# Patient Record
Sex: Male | Born: 1980 | Race: White | Hispanic: No | Marital: Single | Smoking: Current every day smoker
Health system: Southern US, Community
[De-identification: ages and names within clinical notes are randomized; demographics above are authoritative.]

## PROBLEM LIST (undated history)

## (undated) DIAGNOSIS — I10 Essential (primary) hypertension: Secondary | ICD-10-CM

## (undated) DIAGNOSIS — T7840XA Allergy, unspecified, initial encounter: Secondary | ICD-10-CM

## (undated) DIAGNOSIS — K7460 Unspecified cirrhosis of liver: Secondary | ICD-10-CM

## (undated) HISTORY — PX: OTHER SURGICAL HISTORY: SHX169

## (undated) HISTORY — DX: Allergy, unspecified, initial encounter: T78.40XA

## (undated) HISTORY — PX: FRACTURE SURGERY: SHX138

---
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
## 2015-01-01 ENCOUNTER — Ambulatory Visit (INDEPENDENT_AMBULATORY_CARE_PROVIDER_SITE_OTHER): Payer: Medicaid Other

## 2015-01-01 ENCOUNTER — Ambulatory Visit (INDEPENDENT_AMBULATORY_CARE_PROVIDER_SITE_OTHER): Payer: Medicaid Other | Admitting: Family Medicine

## 2015-01-01 ENCOUNTER — Encounter: Payer: Self-pay | Admitting: Family Medicine

## 2015-01-01 VITALS — BP 147/84 | HR 92 | Temp 98.0°F | Ht 71.0 in | Wt 212.0 lb

## 2015-01-01 DIAGNOSIS — M25561 Pain in right knee: Secondary | ICD-10-CM | POA: Insufficient documentation

## 2015-01-01 MED ORDER — HYDROCODONE-ACETAMINOPHEN 5-325 MG PO TABS: 1.0000 | ORAL_TABLET | Freq: Four times a day (QID) | ORAL | Status: DC | PRN

## 2015-01-01 NOTE — Progress Notes (Signed)
   HPI  Patient presents today for evaluation of right knee pain  Patient explains that about a week ago he bumped into a nightstand with the lateral edge of his right knee and fell to the ground landing on his right lateral knee.   He had significant swelling for a few days that has improved. He still continues to have generalized anterior knee pain with standing or walking. He also complains of medial knee pain. He denies any locking or popping symptoms. He has a significant past surgical history of femur reconstruction with a rod which is seen on x-ray today. 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  He's been trying Advil with some improvement of the pain. He's also tried ice  PMH: Smoking status noted ROS: Per HPI  Objective: BP 147/84 mmHg  Pulse 92  Temp(Src) 98 F (36.7 C) (Oral)  Ht  (1.803 m)  Wt 212 lb (96.163 kg)  BMI 29.58 kg/m2 Gen: NAD, alert, cooperative with exam HEENT: NCAT CV: RRR, good S1/S2, no murmur Resp: CTABL, no wheezes, non-labored Ext: No edema, warm Neuro: Alert and oriented, No gross deficits MSK: R knee without erythema, effusion, bruising, or gross deformity - some scarring on Lateral knee No joint line tenderness.  ligamentously intact to Lachman's and with varus and valgus stress.  Negative McMurray's test Some pain with valgus stress  Knee x-ray 01/01/2015: No acute fracture, our group from previous surgery and right femur No significant OA  Assessment and plan:  #  knee pain knee pain after an injury one week ago. Given one additional week out of work. Considering that he has hardware in his femur in very close proximity on very hesitant to offer steroid injection or aspiration today. He does not have an effusion Recommended conservative treatment with NSAIDs,  compression, ice Also given Norco for breakthrough pain Call in 1-2 weeks if not improved we'll gladly try to help him arrange orthopedics follow-up this will be difficult given his self-pay situation Discussed limitations giving narcotic pain medications, limit to one prescription for this injury. He understands     Orders Placed This Encounter  Procedures  . DG Knee 1-2 Views Right    Standing Status: Future     Number of Occurrences: 1     Standing Expiration Date: 03/02/2016    Order Specific Question:  Reason for Exam (SYMPTOM  OR DIAGNOSIS REQUIRED)    Answer:  R knee pain, Hx of femur surgery    Order Specific Question:  Preferred imaging location?    Answer:  Internal    Meds ordered this encounter  Medications  . HYDROcodone-acetaminophen (NORCO) 5-325 MG per tablet    Sig: Take 1 tablet by mouth every 6 (six) hours as needed for moderate pain.    Dispense:  30 tablet    Refill:  0    Murtis Sink, MD [facility] 01/01/2015, 4:56 PM

## 2015-01-01 NOTE — Patient Instructions (Signed)
Great to meet you guys!   Try aleve 2 pills twice a day for the next week  hydrocodone for unrelieved pain Ice 15 minutes 4-6 times a day Compression (ace bandage or tight knee slexe with a hole over the kneecap)   Call me if you haven't improved we can help you get into ortho.

## 2015-04-03 ENCOUNTER — Telehealth: Payer: Self-pay | Admitting: Family Medicine

## 2015-04-03 NOTE — Telephone Encounter (Signed)
denied °

## 2017-02-03 ENCOUNTER — Telehealth: Payer: Self-pay | Admitting: *Deleted

## 2017-02-03 NOTE — Telephone Encounter (Addendum)
Opened in Error/SLS 

## 2019-12-31 ENCOUNTER — Other Ambulatory Visit: Payer: Self-pay

## 2019-12-31 ENCOUNTER — Emergency Department (HOSPITAL_COMMUNITY)
Admission: EM | Admit: 2019-12-31 | Discharge: 2020-01-01 | Disposition: A | Discharge status: Home / Self Care | Payer: BC Managed Care – PPO | Attending: Emergency Medicine | Admitting: Emergency Medicine

## 2019-12-31 ENCOUNTER — Encounter (HOSPITAL_COMMUNITY): Payer: Self-pay | Admitting: Emergency Medicine

## 2019-12-31 DIAGNOSIS — F1910 Other psychoactive substance abuse, uncomplicated: Secondary | ICD-10-CM | POA: Diagnosis not present

## 2019-12-31 DIAGNOSIS — R45851 Suicidal ideations: Secondary | ICD-10-CM | POA: Diagnosis not present

## 2019-12-31 DIAGNOSIS — Z20822 Contact with and (suspected) exposure to covid-19: Secondary | ICD-10-CM | POA: Diagnosis not present

## 2019-12-31 DIAGNOSIS — F17210 Nicotine dependence, cigarettes, uncomplicated: Secondary | ICD-10-CM | POA: Insufficient documentation

## 2019-12-31 DIAGNOSIS — F329 Major depressive disorder, single episode, unspecified: Secondary | ICD-10-CM | POA: Insufficient documentation

## 2019-12-31 DIAGNOSIS — Z79899 Other long term (current) drug therapy: Secondary | ICD-10-CM | POA: Diagnosis not present

## 2019-12-31 DIAGNOSIS — Y907 Blood alcohol level of 200-239 mg/100 ml: Secondary | ICD-10-CM | POA: Insufficient documentation

## 2019-12-31 NOTE — ED Triage Notes (Addendum)
Pt to ED with c/o needing help for ETOH   St's he has been feeling suicidal x's 2 years  Does not have a plan  Pt denies HI  Pt st's he just got out of a 28 day rehab program and started drinking again the day he got out

## 2020-01-01 ENCOUNTER — Emergency Department (HOSPITAL_COMMUNITY): Admission: EM | Admit: 2020-01-01 | Payer: BC Managed Care – PPO | Source: Home / Self Care

## 2020-01-01 LAB — COMPREHENSIVE METABOLIC PANEL
ALT: 118 U/L — ABNORMAL HIGH (ref 0–44)
AST: 128 U/L — ABNORMAL HIGH (ref 15–41)
Albumin: 4.1 g/dL (ref 3.5–5.0)
Alkaline Phosphatase: 92 U/L (ref 38–126)
Anion gap: 21 — ABNORMAL HIGH (ref 5–15)
BUN: 9 mg/dL (ref 6–20)
CO2: 20 mmol/L — ABNORMAL LOW (ref 22–32)
Calcium: 8.6 mg/dL — ABNORMAL LOW (ref 8.9–10.3)
Chloride: 99 mmol/L (ref 98–111)
Creatinine, Ser: 0.77 mg/dL (ref 0.61–1.24)
GFR calc Af Amer: >60 mL/min (ref >60)
GFR calc non Af Amer: >60 mL/min (ref >60)
Glucose, Bld: 84 mg/dL (ref 70–99)
Potassium: 3.6 mmol/L (ref 3.5–5.1)
Sodium: 140 mmol/L (ref 135–145)
Total Bilirubin: 0.8 mg/dL (ref 0.3–1.2)
Total Protein: 8.8 g/dL — ABNORMAL HIGH (ref 6.5–8.1)

## 2020-01-01 LAB — RAPID URINE DRUG SCREEN, HOSP PERFORMED
Amphetamines: NOT DETECTED
Barbiturates: NOT DETECTED
Benzodiazepines: NOT DETECTED
Cocaine: NOT DETECTED
Opiates: NOT DETECTED
Tetrahydrocannabinol: NOT DETECTED

## 2020-01-01 LAB — BASIC METABOLIC PANEL
Anion gap: 16 — ABNORMAL HIGH (ref 5–15)
BUN: 9 mg/dL (ref 6–20)
CO2: 21 mmol/L — ABNORMAL LOW (ref 22–32)
Calcium: 7.8 mg/dL — ABNORMAL LOW (ref 8.9–10.3)
Chloride: 99 mmol/L (ref 98–111)
Creatinine, Ser: 0.76 mg/dL (ref 0.61–1.24)
GFR calc Af Amer: >60 mL/min (ref >60)
GFR calc non Af Amer: >60 mL/min (ref >60)
Glucose, Bld: 79 mg/dL (ref 70–99)
Potassium: 3.9 mmol/L (ref 3.5–5.1)
Sodium: 136 mmol/L (ref 135–145)

## 2020-01-01 LAB — CBC
HCT: 50.3 % (ref 39.0–52.0)
Hemoglobin: 16.9 g/dL (ref 13.0–17.0)
MCH: 32.9 pg (ref 26.0–34.0)
MCHC: 33.6 g/dL (ref 30.0–36.0)
MCV: 98.1 fL (ref 80.0–100.0)
Platelets: 263 10*3/uL (ref 150–400)
RBC: 5.13 MIL/uL (ref 4.22–5.81)
RDW: 11.4 % — ABNORMAL LOW (ref 11.5–15.5)
WBC: 9.1 10*3/uL (ref 4.0–10.5)
nRBC: 0 % (ref 0.0–0.2)

## 2020-01-01 LAB — SARS CORONAVIRUS 2 BY RT PCR (HOSPITAL ORDER, PERFORMED IN ~~LOC~~ HOSPITAL LAB): SARS Coronavirus 2: NEGATIVE

## 2020-01-01 LAB — ETHANOL: Alcohol, Ethyl (B): 356 mg/dL (ref <10)

## 2020-01-01 LAB — SALICYLATE LEVEL: Salicylate Lvl: <7 mg/dL — ABNORMAL LOW (ref 7.0–30.0)

## 2020-01-01 LAB — ACETAMINOPHEN LEVEL: Acetaminophen (Tylenol), Serum: <10 ug/mL — ABNORMAL LOW (ref 10–30)

## 2020-01-01 MED ORDER — ALUM & MAG HYDROXIDE-SIMETH 200-200-20 MG/5ML PO SUSP
30.0000 mL | Freq: Once | ORAL | Status: AC
  Administered 2020-01-01: 30 mL via ORAL
  Filled 2020-01-01: qty 30

## 2020-01-01 MED ORDER — LORAZEPAM 1 MG PO TABS: 0.0000 mg | ORAL_TABLET | Freq: Two times a day (BID) | ORAL | Status: DC

## 2020-01-01 MED ORDER — LORAZEPAM 2 MG/ML IJ SOLN: 0.0000 mg | Freq: Four times a day (QID) | INTRAMUSCULAR | Status: DC

## 2020-01-01 MED ORDER — SODIUM CHLORIDE 0.9 % IV BOLUS
1000.0000 mL | Freq: Once | INTRAVENOUS | Status: AC
  Administered 2020-01-01: 1000 mL via INTRAVENOUS

## 2020-01-01 MED ORDER — LORAZEPAM 2 MG/ML IJ SOLN: 0.0000 mg | Freq: Two times a day (BID) | INTRAMUSCULAR | Status: DC

## 2020-01-01 MED ORDER — THIAMINE HCL 100 MG PO TABS
100.0000 mg | ORAL_TABLET | Freq: Every day | ORAL | Status: DC
  Administered 2020-01-01: 100 mg via ORAL
  Filled 2020-01-01: qty 1

## 2020-01-01 MED ORDER — LORAZEPAM 1 MG PO TABS
0.0000 mg | ORAL_TABLET | Freq: Four times a day (QID) | ORAL | Status: DC
  Administered 2020-01-01: 1 mg via ORAL
  Filled 2020-01-01: qty 1

## 2020-01-01 MED ORDER — THIAMINE HCL 100 MG/ML IJ SOLN: 100.0000 mg | Freq: Every day | INTRAMUSCULAR | Status: DC

## 2020-01-01 NOTE — ED Notes (Signed)
Request dinner tray for pt

## 2020-01-01 NOTE — ED Notes (Signed)
Pt reports leaving rehab Last Monday, started drinking that day.  Reports he has not eaten in 5 days.

## 2020-01-01 NOTE — ED Notes (Signed)
Pt st's he does not want to stay.

## 2020-01-01 NOTE — ED Notes (Signed)
RN faxed paperwork to [employer]. Pt belongings given back to pt.

## 2020-01-01 NOTE — BH Assessment (Signed)
1500 hours this writer made contact with patient's mother [name] [phone] who did not have any safety concerns in reference to patient being discharged this date. Case was staffed with Maisie Fus NP who recommended once collateral was obtained and no safety concerns were noted patient could be discharged. RN at [facility] was informed with updated disposition.

## 2020-01-01 NOTE — ED Notes (Signed)
Patient verbalizes understanding of discharge instructions. Opportunity for questioning and answers were provided. Armband removed by staff, pt discharged from ED. Pt. ambulatory and discharged home.  

## 2020-01-01 NOTE — ED Provider Notes (Signed)
Psychiatry has reevaluated the patient this afternoon, they also spoke with the patient's mother who did not have collateral or continued safety concerns.  At this time the patient is psychiatrically clear they recommend resending IVC and discharge home.  IVC rescinded by Dr. Jeraldine Loots.   Dartha Lodge, PA-C 01/01/20 1606    Gerhard Munch, MD 01/02/20 [phone]

## 2020-01-01 NOTE — ED Notes (Signed)
Pt initially stated he was not suicidal however he later stated he was drinking to end his life.  Pt is calm but guarded.  Gave water to help him find water to urinate.

## 2020-01-01 NOTE — BH Assessment (Addendum)
Comprehensive Clinical Assessment (CCA) Screening, Triage and Referral Note  01/01/2020 Craig Andrews [id number] Patient presents this date with IVC. Per IVC patient was just discharged from 28 days at [facility] on 12/31/19. IVC states patient became impaired yesterday on alcohol and threatened to kill his daughter stating "she was a mistake." Daughter is currently residing with patient's mother. Patient reported said he was going "to a gun store to get a gun" although all the gun shops were closed. A shotgun was removed by the petitioned and is in their possession at this time. Patient was reported to be experiencing AVH at that time per petitioner. Patient relapsed not soon after he was discharged stating he consumed over one fifth of liquor this date. Patient denies any other ingestion. Patient denies any S/I, H/I or AVH. Patient states he is employed at [employer] in [place] where he resides with his 32 year old daughter. Patient denies any prior mental health history or prior attempts or gestures at self harm. Patient denies any current mental health symptoms or prescribed any psychotropic medications. Patient states he was consuming various amounts of alcohol two to three times weekly for the last year until he entered into a 28 program noted above. Patient denies content of IVC although states "he really doesn't remember much" that occurred prior to his arrival at ED. Patient had a noted BAL of 356 on arrival. Patient's UDS was negative. Patient could not identify any immediate stressor associated with his relapse yesterday stating "I am just a alcoholic." Patient does state he remembers having a verbal altercation with his daughter over her "not liking I was drinking again" although cannot recall any statements that he intended to harm her. Patient reports the only weapon he was in possession of was confiscated by petitioner as mentioned above. Patient reports current withdrawals to  include: tremors and some nausea. Patient is remorseful in reference to the events that transpired prior to arrival. Patient states "I really messed up" and need to "get it together." Patient denies any self harm this date, H/I or AVH.   Patient's notes on arrival by EDP: Patient presents to the emergency department with a chief complaint of substance abuse.  He is under IVC for threatening to kill his daughter.  He states that he drinks heavily.  He denies SI.  Denies any recent drug use. Denies any recent illnesses. Collateral information from mother could not be obtained at this time.   Patient is alert and oriented. Patient's memory is intact and thoughts organized. Patient does not appear to be responding to internal stimuli.   1500 hours this writer made contact with patient's mother Craig Andrews [phone] who did not have any safety concerns in reference to patient being discharged this date. RN at [facility] was informed and EDP will rescind IVC and patient discharged.       Visit Diagnosis: Alcohol induced mood disorder.    ICD-10-CM   1. Suicidal ideation  R45.851     Patient Reported Information How did you hear about [place]? Other (Comment) (IVC)   Referral name: No data recorded  Referral phone number: No data recorded Whom do you see for routine medical problems? I don't have a doctor   Practice/Facility Name: No data recorded  Practice/Facility Phone Number: No data recorded  Name of Contact: No data recorded  Contact Number: No data recorded  Contact Fax Number: No data recorded  Prescriber Name: No data recorded  Prescriber Address (if known): No  data recorded What Is the Reason for Your Visit/Call Today? Pt is with IVC  How Long Has This Been Causing You Problems? <Week  Have You Recently Been in Any Inpatient Treatment (Hospital/Detox/Crisis Center/28-Day Program)? No   Name/Location of Program/Hospital:No data recorded  How Long Were You There? No data recorded  When  Were You Discharged? No data recorded Have You Ever Received Services From [facility] Before? No   Who Do You See at [facility]? No data recorded Have You Recently Had Any Thoughts About Hurting Yourself? No   Are You Planning to Commit Suicide/Harm Yourself At This time?  No  Have you Recently Had Thoughts About Hurting Someone Karolee Ohs? No   Explanation: Pt used alcohol prior to arrival  Have You Used Any Alcohol or Drugs in the Past 24 Hours? Yes   How Long Ago Did You Use Drugs or Alcohol?  0100   What Did You Use and How Much? Pt states over one fifth of alcohol  What Do You Feel Would Help You the Most Today? Therapy  Do You Currently Have a Therapist/Psychiatrist? No   Name of Therapist/Psychiatrist: No data recorded  Have You Been Recently Discharged From Any Office Practice or Programs? No   Explanation of Discharge From Practice/Program:  No data recorded    CCA Screening Triage Referral Assessment Type of Contact: Face-to-Face   Is this Initial or Reassessment? No data recorded  Date Telepsych consult ordered in [facility]:  No data recorded  Time Telepsych consult ordered in [facility]:  No data recorded Patient Reported Information Reviewed? Yes   Patient Left Without Being Seen? No data recorded  Reason for Not Completing Assessment: No data recorded Collateral Involvement: None at this time  Does Patient Have a Court Appointed Legal Guardian? No data recorded  Name and Contact of Legal Guardian:  No data recorded If Minor and Not Living with Parent(s), Who has Custody? No data recorded Is CPS involved or ever been involved? Never  Is APS involved or ever been involved? Never  Patient Determined To Be At Risk for Harm To Self or Others Based on Review of Patient Reported Information or Presenting Complaint? No   Method: No data recorded  Availability of Means: No data recorded  Intent: No data recorded  Notification Required: No data recorded  Additional  Information for Danger to Others Potential:  No data recorded  Additional Comments for Danger to Others Potential:  No data recorded  Are There Guns or Other Weapons in Your Home?  No data recorded   Types of Guns/Weapons: No data recorded   Are These Weapons Safely Secured?                              No data recorded   Who Could Verify You Are Able To Have These Secured:    No data recorded Do You Have any Outstanding Charges, Pending Court Dates, Parole/Probation? No data recorded Contacted To Inform of Risk of Harm To Self or Others: Other: Comment (NA)  Location of Assessment: [facility]  Does Patient Present under Involuntary Commitment? Yes   IVC Papers Initial File Date: 01/01/20   [place] of Residence: [place]  Patient Currently Receiving the Following Services: Not Receiving Services   Determination of Need: No data recorded  Options For Referral: Outpatient Therapy   Alfredia Ferguson, LCAS

## 2020-01-01 NOTE — ED Provider Notes (Signed)
[facility] EMERGENCY DEPARTMENT Provider Note   CSN: [id number] Arrival date & time: 12/31/19  2336     History Chief Complaint  Patient presents with  . Suicidal    Lyn Deemer is a 39 y.o. male.  Patient presents to the emergency department with a chief complaint of substance abuse.  He is under IVC for threatening to kill his daughter.  He states that he drinks heavily.  He denies SI.  Denies any recent drug use.  Denies any recent illnesses.  The history is provided by the patient. No language interpreter was used.       Past Medical History:  Diagnosis Date  . Allergy     Patient Active Problem List   Diagnosis Date Noted  . Right knee pain 01/01/2015    Past Surgical History:  Procedure Laterality Date  . FRACTURE SURGERY    . rod inside leg         No family history on file.  Social History   Tobacco Use  . Smoking status: Current Every Day Smoker    Packs/day: 0.50    Types: Cigarettes  . Smokeless tobacco: Never Used  Substance Use Topics  . Alcohol use: Yes  . Drug use: Yes    Types: Marijuana    Home Medications Prior to Admission medications   Medication Sig Start Date End Date Taking? Authorizing Provider  HYDROcodone-acetaminophen (NORCO) 5-325 MG per tablet Take 1 tablet by mouth every 6 (six) hours as needed for moderate pain. 01/01/15   Elenora Gamma, MD    Allergies    Patient has no allergy information on record.  Review of Systems   Review of Systems  All other systems reviewed and are negative.   Physical Exam Updated Vital Signs BP 138/89 (BP Location: Right Arm)   Pulse (!) 110   Temp 98.8 F (37.1 C) (Oral)   Resp 16   Ht 5\' 11"  (1.803 m)   Wt 86.2 kg   SpO2 95%   BMI 26.50 kg/m   Physical Exam Vitals and nursing note reviewed.  Constitutional:      Appearance: He is well-developed.  HENT:     Head: Normocephalic and atraumatic.  Eyes:     Conjunctiva/sclera: Conjunctivae  normal.  Cardiovascular:     Rate and Rhythm: Normal rate and regular rhythm.     Heart sounds: No murmur heard.   Pulmonary:     Effort: Pulmonary effort is normal. No respiratory distress.     Breath sounds: Normal breath sounds.  Abdominal:     Palpations: Abdomen is soft.     Tenderness: There is no abdominal tenderness.  Musculoskeletal:     Cervical back: Neck supple.  Skin:    General: Skin is warm and dry.  Neurological:     Mental Status: He is alert.  Psychiatric:     Comments: Depressed     ED Results / Procedures / Treatments   Labs (all labs ordered are listed, but only abnormal results are displayed) Labs Reviewed  COMPREHENSIVE METABOLIC PANEL - Abnormal; Notable for the following components:      Result Value   CO2 20 (*)    Calcium 8.6 (*)    Total Protein 8.8 (*)    AST 128 (*)    ALT 118 (*)    Anion gap 21 (*)    All other components within normal limits  ETHANOL - Abnormal; Notable for the following components:   Alcohol,  Ethyl (B) 356 (*)    All other components within normal limits  SALICYLATE LEVEL - Abnormal; Notable for the following components:   Salicylate Lvl <7.0 (*)    All other components within normal limits  ACETAMINOPHEN LEVEL - Abnormal; Notable for the following components:   Acetaminophen (Tylenol), Serum <10 (*)    All other components within normal limits  CBC - Abnormal; Notable for the following components:   RDW 11.4 (*)    All other components within normal limits  RAPID URINE DRUG SCREEN, HOSP PERFORMED    EKG None  Radiology No results found.  Procedures Procedures (including critical care time)  Medications Ordered in ED Medications - No data to display  ED Course  I have reviewed the triage vital signs and the nursing notes.  Pertinent labs & imaging results that were available during my care of the patient were reviewed by me and considered in my medical decision making (see chart for details).    MDM  Rules/Calculators/A&P                          Patient here under IVC.  IVC papers state that he threatened to kill his daughter because she is "a mistake."  Apparently he attempted to go to a gun store to buy a gun.  Laboratory work-up notable for elevated anion gap of 21, will give some fluid.  Suspect this is secondary to his alcohol use.  Ethanol is 356.  6:32 AM Anion gap improving after fluids.  Patient appears medically clear for TTS evaluation.    Final Clinical Impression(s) / ED Diagnoses Final diagnoses:  Suicidal ideation    Rx / DC Orders ED Discharge Orders    None       Roxy Horseman, PA-C 01/01/20 [id number]    Palumbo, April, MD 01/01/20 [phone]

## 2020-01-01 NOTE — ED Notes (Signed)
Theodoro Grist at bedside completing mental health assessment

## 2020-01-01 NOTE — ED Notes (Signed)
Gave pt sandwich and water per his request

## 2020-01-01 NOTE — ED Notes (Signed)
Belongings inventoried and 2 bags placed in locker 1.  Breakfast ordered

## 2020-01-01 NOTE — ED Notes (Signed)
This NT asked pt to change into purple scrubs, pt states "you can put me right back in the car that brought me here". RN notified.

## 2020-01-01 NOTE — ED Notes (Signed)
PT took shower- linens changed

## 2020-02-20 ENCOUNTER — Encounter (HOSPITAL_COMMUNITY): Payer: Self-pay | Admitting: *Deleted

## 2020-02-20 ENCOUNTER — Emergency Department (HOSPITAL_COMMUNITY): Payer: BC Managed Care – PPO

## 2020-02-20 ENCOUNTER — Other Ambulatory Visit: Payer: Self-pay

## 2020-02-20 DIAGNOSIS — R079 Chest pain, unspecified: Secondary | ICD-10-CM | POA: Diagnosis present

## 2020-02-20 DIAGNOSIS — R5383 Other fatigue: Secondary | ICD-10-CM | POA: Diagnosis not present

## 2020-02-20 DIAGNOSIS — Z20822 Contact with and (suspected) exposure to covid-19: Secondary | ICD-10-CM | POA: Diagnosis not present

## 2020-02-20 DIAGNOSIS — R509 Fever, unspecified: Secondary | ICD-10-CM | POA: Insufficient documentation

## 2020-02-20 DIAGNOSIS — Z5321 Procedure and treatment not carried out due to patient leaving prior to being seen by health care provider: Secondary | ICD-10-CM | POA: Diagnosis not present

## 2020-02-20 DIAGNOSIS — R0602 Shortness of breath: Secondary | ICD-10-CM | POA: Insufficient documentation

## 2020-02-20 LAB — RESPIRATORY PANEL BY RT PCR (FLU A&B, COVID)
Influenza A by PCR: NEGATIVE
Influenza B by PCR: NEGATIVE
SARS Coronavirus 2 by RT PCR: NEGATIVE

## 2020-02-20 NOTE — ED Triage Notes (Signed)
Pt with mid CP with SOB starting today, fevers at home.  Fatigued.  Denies any known Covid exposure.  Denies taking covid vaccine.

## 2020-02-21 ENCOUNTER — Emergency Department (HOSPITAL_COMMUNITY)
Admission: EM | Admit: 2020-02-21 | Discharge: 2020-02-21 | Disposition: A | Discharge status: Home / Self Care | Payer: BC Managed Care – PPO | Attending: Emergency Medicine | Admitting: Emergency Medicine

## 2020-02-21 LAB — CBC WITH DIFFERENTIAL/PLATELET
Abs Immature Granulocytes: 0.01 10*3/uL (ref 0.00–0.07)
Basophils Absolute: 0.2 10*3/uL — ABNORMAL HIGH (ref 0.0–0.1)
Basophils Relative: 2 %
Eosinophils Absolute: 0.1 10*3/uL (ref 0.0–0.5)
Eosinophils Relative: 1 %
HCT: 46.7 % (ref 39.0–52.0)
Hemoglobin: 15.2 g/dL (ref 13.0–17.0)
Immature Granulocytes: 0 %
Lymphocytes Relative: 34 %
Lymphs Abs: 2.4 10*3/uL (ref 0.7–4.0)
MCH: 31.5 pg (ref 26.0–34.0)
MCHC: 32.5 g/dL (ref 30.0–36.0)
MCV: 96.7 fL (ref 80.0–100.0)
Monocytes Absolute: 0.5 10*3/uL (ref 0.1–1.0)
Monocytes Relative: 7 %
Neutro Abs: 3.9 10*3/uL (ref 1.7–7.7)
Neutrophils Relative %: 56 %
Platelets: 241 10*3/uL (ref 150–400)
RBC: 4.83 MIL/uL (ref 4.22–5.81)
RDW: 15.5 % (ref 11.5–15.5)
WBC: 6.9 10*3/uL (ref 4.0–10.5)
nRBC: 0 % (ref 0.0–0.2)

## 2020-02-21 LAB — BASIC METABOLIC PANEL
Anion gap: 11 (ref 5–15)
BUN: 18 mg/dL (ref 6–20)
CO2: 24 mmol/L (ref 22–32)
Calcium: 8 mg/dL — ABNORMAL LOW (ref 8.9–10.3)
Chloride: 102 mmol/L (ref 98–111)
Creatinine, Ser: 1.12 mg/dL (ref 0.61–1.24)
GFR, Estimated: >60 mL/min (ref >60)
Glucose, Bld: 103 mg/dL — ABNORMAL HIGH (ref 70–99)
Potassium: 3.7 mmol/L (ref 3.5–5.1)
Sodium: 137 mmol/L (ref 135–145)

## 2020-02-21 LAB — TROPONIN I (HIGH SENSITIVITY)
Troponin I (High Sensitivity): 4 ng/L (ref <18)
Troponin I (High Sensitivity): 4 ng/L (ref <18)

## 2020-02-21 NOTE — Progress Notes (Signed)
No answer from waiting room.

## 2020-02-21 NOTE — ED Notes (Signed)
Called for patient x 2.  Guest relations staff state they have not seen patient for quite some time now.

## 2020-02-22 ENCOUNTER — Other Ambulatory Visit: Payer: Self-pay

## 2020-02-22 ENCOUNTER — Emergency Department (HOSPITAL_COMMUNITY): Payer: BC Managed Care – PPO

## 2020-02-22 ENCOUNTER — Emergency Department (HOSPITAL_COMMUNITY)
Admission: EM | Admit: 2020-02-22 | Discharge: 2020-02-22 | Disposition: A | Discharge status: Home / Self Care | Payer: BC Managed Care – PPO | Attending: Emergency Medicine | Admitting: Emergency Medicine

## 2020-02-22 ENCOUNTER — Encounter (HOSPITAL_COMMUNITY): Payer: Self-pay

## 2020-02-22 DIAGNOSIS — Z79899 Other long term (current) drug therapy: Secondary | ICD-10-CM | POA: Insufficient documentation

## 2020-02-22 DIAGNOSIS — I10 Essential (primary) hypertension: Secondary | ICD-10-CM | POA: Diagnosis not present

## 2020-02-22 DIAGNOSIS — F1590 Other stimulant use, unspecified, uncomplicated: Secondary | ICD-10-CM | POA: Insufficient documentation

## 2020-02-22 DIAGNOSIS — F10920 Alcohol use, unspecified with intoxication, uncomplicated: Secondary | ICD-10-CM

## 2020-02-22 DIAGNOSIS — K7030 Alcoholic cirrhosis of liver without ascites: Secondary | ICD-10-CM | POA: Diagnosis not present

## 2020-02-22 DIAGNOSIS — R42 Dizziness and giddiness: Secondary | ICD-10-CM | POA: Insufficient documentation

## 2020-02-22 DIAGNOSIS — F17210 Nicotine dependence, cigarettes, uncomplicated: Secondary | ICD-10-CM | POA: Diagnosis not present

## 2020-02-22 DIAGNOSIS — R531 Weakness: Secondary | ICD-10-CM | POA: Diagnosis present

## 2020-02-22 DIAGNOSIS — F10220 Alcohol dependence with intoxication, uncomplicated: Secondary | ICD-10-CM | POA: Insufficient documentation

## 2020-02-22 HISTORY — DX: Essential (primary) hypertension: I10

## 2020-02-22 LAB — COMPREHENSIVE METABOLIC PANEL
ALT: 172 U/L — ABNORMAL HIGH (ref 0–44)
AST: 526 U/L — ABNORMAL HIGH (ref 15–41)
Albumin: 3.5 g/dL (ref 3.5–5.0)
Alkaline Phosphatase: 141 U/L — ABNORMAL HIGH (ref 38–126)
Anion gap: 15 (ref 5–15)
BUN: 15 mg/dL (ref 6–20)
CO2: 23 mmol/L (ref 22–32)
Calcium: 7.8 mg/dL — ABNORMAL LOW (ref 8.9–10.3)
Chloride: 100 mmol/L (ref 98–111)
Creatinine, Ser: 0.85 mg/dL (ref 0.61–1.24)
GFR, Estimated: >60 mL/min (ref >60)
Glucose, Bld: 94 mg/dL (ref 70–99)
Potassium: 4.1 mmol/L (ref 3.5–5.1)
Sodium: 138 mmol/L (ref 135–145)
Total Bilirubin: 1.5 mg/dL — ABNORMAL HIGH (ref 0.3–1.2)
Total Protein: 7.9 g/dL (ref 6.5–8.1)

## 2020-02-22 LAB — CBC WITH DIFFERENTIAL/PLATELET
Abs Immature Granulocytes: 0.03 10*3/uL (ref 0.00–0.07)
Basophils Absolute: 0.2 10*3/uL — ABNORMAL HIGH (ref 0.0–0.1)
Basophils Relative: 2 %
Eosinophils Absolute: 0.2 10*3/uL (ref 0.0–0.5)
Eosinophils Relative: 2 %
HCT: 43.1 % (ref 39.0–52.0)
Hemoglobin: 14.6 g/dL (ref 13.0–17.0)
Immature Granulocytes: 0 %
Lymphocytes Relative: 23 %
Lymphs Abs: 2.3 10*3/uL (ref 0.7–4.0)
MCH: 31.8 pg (ref 26.0–34.0)
MCHC: 33.9 g/dL (ref 30.0–36.0)
MCV: 93.9 fL (ref 80.0–100.0)
Monocytes Absolute: 0.9 10*3/uL (ref 0.1–1.0)
Monocytes Relative: 9 %
Neutro Abs: 6.7 10*3/uL (ref 1.7–7.7)
Neutrophils Relative %: 64 %
Platelets: 175 10*3/uL (ref 150–400)
RBC: 4.59 MIL/uL (ref 4.22–5.81)
RDW: 15.4 % (ref 11.5–15.5)
WBC: 10.3 10*3/uL (ref 4.0–10.5)
nRBC: 0 % (ref 0.0–0.2)

## 2020-02-22 LAB — URINALYSIS, ROUTINE W REFLEX MICROSCOPIC
Bilirubin Urine: NEGATIVE
Glucose, UA: NEGATIVE mg/dL
Hgb urine dipstick: NEGATIVE
Ketones, ur: NEGATIVE mg/dL
Leukocytes,Ua: NEGATIVE
Nitrite: NEGATIVE
Protein, ur: 30 mg/dL — AB
Specific Gravity, Urine: 1.021 (ref 1.005–1.030)
pH: 5 (ref 5.0–8.0)

## 2020-02-22 LAB — AMMONIA: Ammonia: 25 umol/L (ref 9–35)

## 2020-02-22 LAB — LIPASE, BLOOD: Lipase: 49 U/L (ref 11–51)

## 2020-02-22 LAB — ETHANOL: Alcohol, Ethyl (B): 342 mg/dL (ref <10)

## 2020-02-22 MED ORDER — SODIUM CHLORIDE 0.9 % IV BOLUS
1000.0000 mL | Freq: Once | INTRAVENOUS | Status: AC
  Administered 2020-02-22: 1000 mL via INTRAVENOUS

## 2020-02-22 NOTE — ED Provider Notes (Signed)
[facility] EMERGENCY DEPARTMENT Provider Note   CSN: [id number] Arrival date & time: 02/22/20  1029     History Chief Complaint  Patient presents with  . Weakness    Craig Andrews is a 39 y.o. male.  The history is provided by the patient and a relative. No language interpreter was used.  Weakness Severity:  Moderate Onset quality:  Gradual Timing:  Constant Progression:  Worsening Chronicity:  New Context: alcohol use   Relieved by:  Nothing Worsened by:  Nothing Ineffective treatments:  None tried Associated symptoms: difficulty walking, dizziness and nausea   Associated symptoms: no abdominal pain and no numbness in extremities   Pt reports he feels like he is drunk but he has not been drinking.  Pt reports he drinks frequently but has been cutting back.  Pt reports he last drank at 11pm last night.       Past Medical History:  Diagnosis Date  . Allergy   . Hypertension     Patient Active Problem List   Diagnosis Date Noted  . Right knee pain 01/01/2015    Past Surgical History:  Procedure Laterality Date  . FRACTURE SURGERY    . rod inside leg         No family history on file.  Social History   Tobacco Use  . Smoking status: Current Every Day Smoker    Packs/day: 0.50    Types: Cigarettes  . Smokeless tobacco: Never Used  Substance Use Topics  . Alcohol use: Yes    Comment: half gallon of liquir in 5 days  . Drug use: Yes    Frequency: 2.0 times per week    Types: Marijuana    Home Medications Prior to Admission medications   Medication Sig Start Date End Date Taking? Authorizing Provider  amLODipine (NORVASC) 5 MG tablet Take 5 mg by mouth daily.   Yes [provider]  fluticasone (FLONASE) 50 MCG/ACT nasal spray Place 1-2 sprays into both nostrils daily. 02/19/20 02/18/21 Yes [provider]  loratadine (CLARITIN) 10 MG tablet Take 10 mg by mouth daily as needed for allergies.   Yes [provider]    Multiple Vitamin (MULTIVITAMIN WITH MINERALS) TABS tablet Take 1 tablet by mouth daily.   Yes [provider]  thiamine 100 MG tablet Take 100 mg by mouth daily.   Yes [provider]  traZODone (DESYREL) 50 MG tablet Take 50 mg by mouth at bedtime as needed for sleep.   Yes [provider]    Allergies    Patient has no known allergies.  Review of Systems   Review of Systems  Gastrointestinal: Positive for nausea. Negative for abdominal pain.  Neurological: Positive for dizziness and weakness.  All other systems reviewed and are negative.   Physical Exam Updated Vital Signs BP (!) 147/92 (BP Location: Right Arm)   Pulse 88   Temp 98.5 F (36.9 C) (Oral)   Resp 14   Ht 5\' 11"  (1.803 m)   Wt 87.5 kg   SpO2 94%   BMI 26.92 kg/m   Physical Exam Vitals and nursing note reviewed.  Constitutional:      Appearance: He is well-developed.  HENT:     Head: Normocephalic and atraumatic.  Eyes:     Conjunctiva/sclera: Conjunctivae normal.  Cardiovascular:     Rate and Rhythm: Normal rate and regular rhythm.     Heart sounds: No murmur heard.   Pulmonary:     Effort: Pulmonary effort  is normal. No respiratory distress.     Breath sounds: Normal breath sounds.  Abdominal:     Palpations: Abdomen is soft.     Tenderness: There is no abdominal tenderness.  Musculoskeletal:        General: Normal range of motion.     Cervical back: Neck supple.  Skin:    General: Skin is warm and dry.  Neurological:     General: No focal deficit present.     Mental Status: He is alert.  Psychiatric:        Mood and Affect: Mood normal.     ED Results / Procedures / Treatments   Labs (all labs ordered are listed, but only abnormal results are displayed) Labs Reviewed  CBC WITH DIFFERENTIAL/PLATELET - Abnormal; Notable for the following components:      Result Value   Basophils Absolute 0.2 (*)    All other components within normal limits  COMPREHENSIVE  METABOLIC PANEL - Abnormal; Notable for the following components:   Calcium 7.8 (*)    AST 526 (*)    ALT 172 (*)    Alkaline Phosphatase 141 (*)    Total Bilirubin 1.5 (*)    All other components within normal limits  URINALYSIS, ROUTINE W REFLEX MICROSCOPIC - Abnormal; Notable for the following components:   APPearance HAZY (*)    Protein, ur 30 (*)    Bacteria, UA RARE (*)    All other components within normal limits  ETHANOL - Abnormal; Notable for the following components:   Alcohol, Ethyl (B) 342 (*)    All other components within normal limits  LIPASE, BLOOD  AMMONIA    EKG EKG Interpretation  Date/Time:  Thursday February 22 2020 10:36:19 EDT Ventricular Rate:  82 PR Interval:    QRS Duration: 97 QT Interval:  395 QTC Calculation: 462 R Axis:   36 Text Interpretation: Sinus rhythm Low voltage, precordial leads Borderline repol abnrm, anterolateral leads Baseline wander in lead(s) V1 V2 V4 V5 V6 No significant change since last tracing Confirmed by Linwood Dibbles [phone]) on 02/22/2020 11:07:32 AM   Radiology CT Head Wo Contrast  Result Date: 02/22/2020 CLINICAL DATA:  Dizziness, gait abnormality EXAM: CT HEAD WITHOUT CONTRAST TECHNIQUE: Contiguous axial images were obtained from the base of the skull through the vertex without intravenous contrast. COMPARISON:  01/17/2020 FINDINGS: Brain: Normal anatomic configuration. No abnormal intra or extra-axial mass lesion or fluid collection. No abnormal mass effect or midline shift. No evidence of acute intracranial hemorrhage or infarct. Ventricular size is normal. Cerebellum unremarkable. Vascular: Unremarkable Skull: Intact Sinuses/Orbits: Paranasal sinuses are clear. Orbits are unremarkable. Other: Mastoid air cells and middle ear cavities are clear. IMPRESSION: Normal examination.  No acute intracranial abnormality. Electronically Signed   By: Helyn Numbers MD   On: 02/22/2020 12:36   DG Chest Portable 1 View  Result Date:  02/21/2020 CLINICAL DATA:  39 year old male with chest pain and shortness of breath. EXAM: PORTABLE CHEST 1 VIEW COMPARISON:  None. FINDINGS: The heart size and mediastinal contours are within normal limits. Both lungs are clear. The visualized skeletal structures are unremarkable. IMPRESSION: No active disease. Electronically Signed   By: Elgie Collard M.D.   On: 02/21/2020 00:24    Procedures Procedures (including critical care time)  Medications Ordered in ED Medications  sodium chloride 0.9 % bolus 1,000 mL (0 mLs Intravenous Stopped 02/22/20 1333)    ED Course  I have reviewed the triage vital signs and the nursing notes.  Pertinent labs & imaging results that were available during my care of the patient were reviewed by me and considered in my medical decision making (see chart for details).    MDM Rules/Calculators/A&P                          MDM: Pt's alcohol is 342.  Pt's lfts are elevated.  Pt counseled on results.  Pt given referrals to outpatient facilities.  Final Clinical Impression(s) / ED Diagnoses Final diagnoses:  Alcoholic intoxication without complication (HCC)  Alcoholic cirrhosis, unspecified whether ascites present (HCC)    Rx / DC Orders ED Discharge Orders    None    An After Visit Summary was printed and given to the patient.    Elson Areas, Cordelia Poche 02/22/20 1843    Linwood Dibbles, MD 02/23/20 [phone]

## 2020-02-22 NOTE — ED Notes (Signed)
CRITICAL VALUE ALERT  Critical Value:  ETOH 342  Date & Time Notied:  02/22/20 & 1150  Provider Notified: EDP  Orders Received/Actions taken: Notified

## 2020-02-22 NOTE — ED Notes (Signed)
Fluids pushed

## 2020-02-22 NOTE — ED Triage Notes (Signed)
Pt came EMS from urgent care in [place]. Pt referred here due to increased weakness and pt feeling as if he is drunk and he is not. Pt verbalized he last drank early weds morning. Pt states he is too weak to walk.

## 2020-08-27 ENCOUNTER — Encounter (HOSPITAL_COMMUNITY): Payer: Self-pay

## 2020-08-27 ENCOUNTER — Emergency Department (HOSPITAL_COMMUNITY): Payer: BC Managed Care – PPO

## 2020-08-27 ENCOUNTER — Other Ambulatory Visit: Payer: Self-pay

## 2020-08-27 ENCOUNTER — Emergency Department (HOSPITAL_COMMUNITY)
Admission: EM | Admit: 2020-08-27 | Discharge: 2020-08-27 | Disposition: A | Discharge status: Home / Self Care | Payer: BC Managed Care – PPO | Attending: Emergency Medicine | Admitting: Emergency Medicine

## 2020-08-27 DIAGNOSIS — S0101XA Laceration without foreign body of scalp, initial encounter: Secondary | ICD-10-CM

## 2020-08-27 DIAGNOSIS — Z79899 Other long term (current) drug therapy: Secondary | ICD-10-CM | POA: Diagnosis not present

## 2020-08-27 DIAGNOSIS — I10 Essential (primary) hypertension: Secondary | ICD-10-CM | POA: Insufficient documentation

## 2020-08-27 DIAGNOSIS — W19XXXA Unspecified fall, initial encounter: Secondary | ICD-10-CM

## 2020-08-27 DIAGNOSIS — W130XXA Fall from, out of or through balcony, initial encounter: Secondary | ICD-10-CM | POA: Diagnosis not present

## 2020-08-27 DIAGNOSIS — F17210 Nicotine dependence, cigarettes, uncomplicated: Secondary | ICD-10-CM | POA: Insufficient documentation

## 2020-08-27 DIAGNOSIS — M7989 Other specified soft tissue disorders: Secondary | ICD-10-CM | POA: Diagnosis not present

## 2020-08-27 DIAGNOSIS — S0990XA Unspecified injury of head, initial encounter: Secondary | ICD-10-CM | POA: Diagnosis present

## 2020-08-27 MED ORDER — OXYCODONE-ACETAMINOPHEN 5-325 MG PO TABS: 1.0000 | ORAL_TABLET | Freq: Four times a day (QID) | ORAL | 0 refills | Status: DC | PRN

## 2020-08-27 MED ORDER — OXYCODONE-ACETAMINOPHEN 5-325 MG PO TABS
1.0000 | ORAL_TABLET | Freq: Once | ORAL | Status: AC
  Administered 2020-08-27: 1 via ORAL
  Filled 2020-08-27: qty 1

## 2020-08-27 MED ORDER — OXYCODONE-ACETAMINOPHEN 5-325 MG PO TABS: 1.0000 | ORAL_TABLET | Freq: Four times a day (QID) | ORAL | 0 refills | Status: AC | PRN

## 2020-08-27 NOTE — ED Provider Notes (Signed)
[facility] EMERGENCY DEPARTMENT Provider Note   CSN: [id number] Arrival date & time: 08/27/20  1513     History Chief Complaint  Patient presents with  . Fall    Craig Andrews is a 40 y.o. male.   Fall  This is a new problem. The current episode started 2 days ago. The problem occurs constantly. The problem has not changed since onset.Associated symptoms include headaches. Pertinent negatives include no chest pain, no abdominal pain and no shortness of breath. Nothing aggravates the symptoms. Nothing relieves the symptoms. Treatments tried: c spine immobilizaiton. The treatment provided no relief.      Past Medical History:  Diagnosis Date  . Allergy   . Hypertension     Patient Active Problem List   Diagnosis Date Noted  . Right knee pain 01/01/2015    Past Surgical History:  Procedure Laterality Date  . FRACTURE SURGERY    . rod inside leg         No family history on file.  Social History   Tobacco Use  . Smoking status: Current Every Day Smoker    Packs/day: 0.50    Types: Cigarettes, Cigars  . Smokeless tobacco: Never Used  Substance Use Topics  . Alcohol use: Yes    Comment: half gallon of liquir in 5 days  . Drug use: Yes    Frequency: 2.0 times per week    Types: Marijuana    Home Medications Prior to Admission medications   Medication Sig Start Date End Date Taking? Authorizing Provider  amLODipine (NORVASC) 5 MG tablet Take 5 mg by mouth daily.    [provider]  fluticasone (FLONASE) 50 MCG/ACT nasal spray Place 1-2 sprays into both nostrils daily. 02/19/20 02/18/21  [provider]  loratadine (CLARITIN) 10 MG tablet Take 10 mg by mouth daily as needed for allergies.    [provider]  Multiple Vitamin (MULTIVITAMIN WITH MINERALS) TABS tablet Take 1 tablet by mouth daily.    [provider]  oxyCODONE-acetaminophen (PERCOCET/ROXICET) 5-325 MG tablet Take 1 tablet by mouth every 6  (six) hours as needed for up to 8 days for severe pain. 08/27/20 09/04/20  Sabino Donovan, MD  thiamine 100 MG tablet Take 100 mg by mouth daily.    [provider]  traZODone (DESYREL) 50 MG tablet Take 50 mg by mouth at bedtime as needed for sleep.    [provider]    Allergies    Patient has no known allergies.  Review of Systems   Review of Systems Constitutional: Negative for chills and fever.  HENT: Negative for congestion and rhinorrhea.   Respiratory: Negative for cough and shortness of breath.   Cardiovascular: Negative for chest pain and palpitations.  Gastrointestinal: Negative for abdominal pain, diarrhea, nausea and vomiting.  Genitourinary: Negative for difficulty urinating and dysuria.  Musculoskeletal: Positive for arthralgias and joint swelling. Negative for back pain.  Skin: Positive for wound. Negative for color change and rash.  Neurological: Positive for headaches. Negative for light-headedness.   Physical Exam Updated Vital Signs BP (!) 143/91   Pulse 94   Temp 98.3 F (36.8 C) (Oral)   Resp 16   Ht 5\' 11"  (1.803 m)   Wt 85.7 kg   SpO2 96%   BMI 26.36 kg/m   Physical Exam Vitals and nursing note reviewed. Exam conducted with a chaperone present.  Constitutional:      General: He is not in acute distress.  Appearance: Normal appearance.  HENT:     Head: Normocephalic.     Comments: 2 cm long superior occipital laceration, hemostatic    Nose: No rhinorrhea.  Eyes:     General:        Right eye: No discharge.        Left eye: No discharge.     Conjunctiva/sclera: Conjunctivae normal.  Cardiovascular:     Rate and Rhythm: Normal rate and regular rhythm.  Pulmonary:     Effort: Pulmonary effort is normal.     Breath sounds: No stridor.  Chest:     Chest wall: No tenderness.  Abdominal:     General: Abdomen is flat. There is no distension.     Palpations: Abdomen is soft.     Tenderness: There is no abdominal tenderness.   Musculoskeletal:        General: Swelling and tenderness present. No deformity or signs of injury.     Comments: Right lateral knee joint, neurovascular intact distal to the swollen right knee mild tenderness of the bilateral malleoli  Skin:    General: Skin is warm and dry.  Neurological:     General: No focal deficit present.     Mental Status: He is alert. Mental status is at baseline.     Motor: No weakness.  Psychiatric:        Mood and Affect: Mood normal.        Behavior: Behavior normal.        Thought Content: Thought content normal.  ED Results / Procedures / Treatments   Labs (all labs ordered are listed, but only abnormal results are displayed) Labs Reviewed - No data to display  EKG None  Radiology DG Chest 2 View  Result Date: 08/27/2020 CLINICAL DATA:  Fall from 8 ft height today. Mid chest pain. Initial encounter. EXAM: CHEST - 2 VIEW COMPARISON:  02/21/2020 FINDINGS: The heart size and mediastinal contours are within normal limits. No evidence of pneumothorax or hemothorax. Both lungs are clear. The visualized skeletal structures are unremarkable. IMPRESSION: No active cardiopulmonary disease. Electronically Signed   By: Danae Orleans M.D.   On: 08/27/2020 17:31   DG Ankle Complete Right  Result Date: 08/27/2020 CLINICAL DATA:  Fall from 8 ft height today. Right ankle injury and pain. Initial encounter. EXAM: RIGHT ANKLE - COMPLETE 3+ VIEW COMPARISON:  None. FINDINGS: There is no evidence of fracture, dislocation, or joint effusion. There is no evidence of arthropathy or other focal bone abnormality. Soft tissues are unremarkable. IMPRESSION: Negative. Electronically Signed   By: Danae Orleans M.D.   On: 08/27/2020 17:30   CT Head Wo Contrast  Result Date: 08/27/2020 CLINICAL DATA:  Pain after slip and fall from porch EXAM: CT HEAD WITHOUT CONTRAST CT CERVICAL SPINE WITHOUT CONTRAST TECHNIQUE: Multidetector CT imaging of the head and cervical spine was performed  following the standard protocol without intravenous contrast. Multiplanar CT image reconstructions of the cervical spine were also generated. COMPARISON:  February 22, 2020 FINDINGS: CT HEAD FINDINGS Brain: No evidence of acute large vascular territory infarction, hemorrhage, hydrocephalus, extra-axial collection or mass lesion/mass effect. Vascular: No hyperdense vessel or unexpected calcification. Skull: Normal. Negative for fracture or focal lesion. Sinuses/Orbits: The paranasal sinuses and mastoid air cells are predominantly clear. Other: Extra calvarial hematoma and soft tissue swelling overlying the posterior calvarial vertex. Dental carie with periapical lucency involving posterior left molars. CT CERVICAL SPINE FINDINGS Alignment: Normal. Skull base and vertebrae: No acute fracture. No primary  bone lesion or focal pathologic process. Soft tissues and spinal canal: No prevertebral fluid or swelling. No visible canal hematoma. Disc levels:  Preserved Upper chest: Unremarkable Other: Ossicle in the nuchal ligament. Large sialoliths in the left salivary glands on image 1/7. IMPRESSION: 1. No acute intracranial abnormality. 2. Extracalvarial hematoma and soft tissue swelling overlying the posterior calvarial vertex. No underlying calvarial fracture. 3. No acute fracture or subluxation of the cervical spine. Electronically Signed   By: Maudry MayhewJeffrey  Waltz MD   On: 08/27/2020 17:45   CT Cervical Spine Wo Contrast  Result Date: 08/27/2020 CLINICAL DATA:  Pain after slip and fall from porch EXAM: CT HEAD WITHOUT CONTRAST CT CERVICAL SPINE WITHOUT CONTRAST TECHNIQUE: Multidetector CT imaging of the head and cervical spine was performed following the standard protocol without intravenous contrast. Multiplanar CT image reconstructions of the cervical spine were also generated. COMPARISON:  February 22, 2020 FINDINGS: CT HEAD FINDINGS Brain: No evidence of acute large vascular territory infarction, hemorrhage,  hydrocephalus, extra-axial collection or mass lesion/mass effect. Vascular: No hyperdense vessel or unexpected calcification. Skull: Normal. Negative for fracture or focal lesion. Sinuses/Orbits: The paranasal sinuses and mastoid air cells are predominantly clear. Other: Extra calvarial hematoma and soft tissue swelling overlying the posterior calvarial vertex. Dental carie with periapical lucency involving posterior left molars. CT CERVICAL SPINE FINDINGS Alignment: Normal. Skull base and vertebrae: No acute fracture. No primary bone lesion or focal pathologic process. Soft tissues and spinal canal: No prevertebral fluid or swelling. No visible canal hematoma. Disc levels:  Preserved Upper chest: Unremarkable Other: Ossicle in the nuchal ligament. Large sialoliths in the left salivary glands on image 1/7. IMPRESSION: 1. No acute intracranial abnormality. 2. Extracalvarial hematoma and soft tissue swelling overlying the posterior calvarial vertex. No underlying calvarial fracture. 3. No acute fracture or subluxation of the cervical spine. Electronically Signed   By: Maudry MayhewJeffrey  Waltz MD   On: 08/27/2020 17:45   DG Knee Complete 4 Views Right  Result Date: 08/27/2020 CLINICAL DATA:  40 year old male with fall and right knee pain. EXAM: RIGHT KNEE - COMPLETE 4+ VIEW COMPARISON:  None. FINDINGS: There is no acute fracture or dislocation. Partially visualized intramedullary nail in the femur. The hardware is intact. No significant arthritic changes. There is a moderate suprapatellar effusion. The soft tissues are unremarkable. IMPRESSION: 1. No acute fracture or dislocation. 2. Moderate suprapatellar effusion. Electronically Signed   By: Elgie CollardArash  Radparvar M.D.   On: 08/27/2020 17:29    Procedures .Marland Kitchen.Laceration Repair  Date/Time: 08/27/2020 6:27 PM Performed by: Sabino DonovanKatz, Niquan Charnley C, MD Authorized by: Sabino DonovanKatz, Kristofor Michalowski C, MD   Consent:    Consent obtained:  Verbal   Consent given by:  Patient   Risks discussed:  Infection,  pain, poor cosmetic result, need for additional repair and poor wound healing   Alternatives discussed:  No treatment Universal protocol:    Patient identity confirmed:  Verbally with patient Laceration details:    Location:  Scalp   Scalp location:  Crown   Length (cm):  2 Exploration:    Contaminated: no   Treatment:    Area cleansed with:  Saline   Amount of cleaning:  Standard Skin repair:    Repair method:  Staples   Number of staples:  3 Approximation:    Approximation:  Close Repair type:    Repair type:  Intermediate Post-procedure details:    Dressing:  Open (no dressing)   Procedure completion:  Tolerated well, no immediate complications     Medications  Ordered in ED Medications  oxyCODONE-acetaminophen (PERCOCET/ROXICET) 5-325 MG per tablet 1 tablet (1 tablet Oral Given 08/27/20 1742)    ED Course  I have reviewed the triage vital signs and the nursing notes.  Pertinent labs & imaging results that were available during my care of the patient were reviewed by me and considered in my medical decision making (see chart for details).    MDM Rules/Calculators/A&P                          Fall rule out multiple wounds with imaging.  No laboratory studies needed.  Tetanus up-to-date.  Will have scalp laceration stapled as described above.  Will reassess ambulation for other injuries after initial imaging.  CT and x-ray imaging reviewed by radiology myself shows no acute fracture or malalignment or intracranial pathology.  Clearing his c-collar bedside personally with no new tenderness to palpation normal range of motion.  He will be given a short course of pain medication told to follow-up with his primary care providers for staple removal and given return precautions and wound care instructions. Final Clinical Impression(s) / ED Diagnoses Final diagnoses:  Fall, initial encounter  Laceration of scalp, initial encounter    Rx / DC Orders ED Discharge Orders          Ordered    oxyCODONE-acetaminophen (PERCOCET/ROXICET) 5-325 MG tablet  Every 6 hours PRN,   Status:  Discontinued        08/27/20 1824    oxyCODONE-acetaminophen (PERCOCET/ROXICET) 5-325 MG tablet  Every 6 hours PRN        08/27/20 1827           Sabino Donovan, MD 08/27/20 Silva Bandy

## 2020-08-27 NOTE — Discharge Instructions (Signed)
Staples to be removed in 10 to 14 days.  You can do this urgent care primary care or the emergency department.  Let warm soapy water run over them and blot dry.  Try not to scrub or submerge the staples.

## 2020-08-27 NOTE — ED Triage Notes (Signed)
Pt presents with pain to head and neck s/p fall off of his porch. Admits to ETOH use and states he does not know how he fell backwards. Laceration also noted to scalp. Bleeding controlled.

## 2020-08-27 NOTE — ED Notes (Signed)
Patient transported to X-ray 

## 2020-09-04 ENCOUNTER — Other Ambulatory Visit: Payer: Self-pay

## 2020-09-04 ENCOUNTER — Encounter (HOSPITAL_COMMUNITY): Payer: Self-pay

## 2020-09-04 DIAGNOSIS — F332 Major depressive disorder, recurrent severe without psychotic features: Secondary | ICD-10-CM | POA: Insufficient documentation

## 2020-09-04 DIAGNOSIS — S0993XA Unspecified injury of face, initial encounter: Secondary | ICD-10-CM | POA: Diagnosis present

## 2020-09-04 DIAGNOSIS — F17210 Nicotine dependence, cigarettes, uncomplicated: Secondary | ICD-10-CM | POA: Insufficient documentation

## 2020-09-04 DIAGNOSIS — I10 Essential (primary) hypertension: Secondary | ICD-10-CM | POA: Diagnosis not present

## 2020-09-04 DIAGNOSIS — S0512XA Contusion of eyeball and orbital tissues, left eye, initial encounter: Secondary | ICD-10-CM | POA: Insufficient documentation

## 2020-09-04 DIAGNOSIS — S80211A Abrasion, right knee, initial encounter: Secondary | ICD-10-CM | POA: Insufficient documentation

## 2020-09-04 DIAGNOSIS — Z79899 Other long term (current) drug therapy: Secondary | ICD-10-CM | POA: Insufficient documentation

## 2020-09-04 DIAGNOSIS — F1290 Cannabis use, unspecified, uncomplicated: Secondary | ICD-10-CM | POA: Diagnosis not present

## 2020-09-04 DIAGNOSIS — Y92009 Unspecified place in unspecified non-institutional (private) residence as the place of occurrence of the external cause: Secondary | ICD-10-CM | POA: Insufficient documentation

## 2020-09-04 DIAGNOSIS — F1024 Alcohol dependence with alcohol-induced mood disorder: Secondary | ICD-10-CM | POA: Insufficient documentation

## 2020-09-04 DIAGNOSIS — Y908 Blood alcohol level of 240 mg/100 ml or more: Secondary | ICD-10-CM | POA: Insufficient documentation

## 2020-09-04 DIAGNOSIS — W130XXA Fall from, out of or through balcony, initial encounter: Secondary | ICD-10-CM | POA: Insufficient documentation

## 2020-09-04 NOTE — ED Triage Notes (Signed)
Pt to er, pt states that he fell off his porch last week, states that he is here to get checked out because he thinks that he might have a concussion, states that when he was here last time he had a ct and x rays and has staples in his head.  Pt states that he is more confused even since the fall.

## 2020-09-05 ENCOUNTER — Emergency Department (HOSPITAL_COMMUNITY)
Admission: EM | Admit: 2020-09-05 | Discharge: 2020-09-05 | Disposition: A | Discharge status: Home / Self Care | Payer: BC Managed Care – PPO | Attending: Emergency Medicine | Admitting: Emergency Medicine

## 2020-09-05 DIAGNOSIS — F10920 Alcohol use, unspecified with intoxication, uncomplicated: Secondary | ICD-10-CM

## 2020-09-05 DIAGNOSIS — F1094 Alcohol use, unspecified with alcohol-induced mood disorder: Secondary | ICD-10-CM

## 2020-09-05 DIAGNOSIS — S92911D Unspecified fracture of right toe(s), subsequent encounter for fracture with routine healing: Secondary | ICD-10-CM

## 2020-09-05 DIAGNOSIS — S0990XA Unspecified injury of head, initial encounter: Secondary | ICD-10-CM

## 2020-09-05 LAB — CBC WITH DIFFERENTIAL/PLATELET
Abs Immature Granulocytes: 0.03 10*3/uL (ref 0.00–0.07)
Basophils Absolute: 0.1 10*3/uL (ref 0.0–0.1)
Basophils Relative: 1 %
Eosinophils Absolute: 0.1 10*3/uL (ref 0.0–0.5)
Eosinophils Relative: 1 %
HCT: 36.8 % — ABNORMAL LOW (ref 39.0–52.0)
Hemoglobin: 12.3 g/dL — ABNORMAL LOW (ref 13.0–17.0)
Immature Granulocytes: 0 %
Lymphocytes Relative: 22 %
Lymphs Abs: 2.3 10*3/uL (ref 0.7–4.0)
MCH: 33.9 pg (ref 26.0–34.0)
MCHC: 33.4 g/dL (ref 30.0–36.0)
MCV: 101.4 fL — ABNORMAL HIGH (ref 80.0–100.0)
Monocytes Absolute: 0.9 10*3/uL (ref 0.1–1.0)
Monocytes Relative: 9 %
Neutro Abs: 6.9 10*3/uL (ref 1.7–7.7)
Neutrophils Relative %: 67 %
Platelets: 117 10*3/uL — ABNORMAL LOW (ref 150–400)
RBC: 3.63 MIL/uL — ABNORMAL LOW (ref 4.22–5.81)
RDW: 18.7 % — ABNORMAL HIGH (ref 11.5–15.5)
WBC: 10.3 10*3/uL (ref 4.0–10.5)
nRBC: 0 % (ref 0.0–0.2)

## 2020-09-05 LAB — COMPREHENSIVE METABOLIC PANEL
ALT: 87 U/L — ABNORMAL HIGH (ref 0–44)
AST: 269 U/L — ABNORMAL HIGH (ref 15–41)
Albumin: 3.3 g/dL — ABNORMAL LOW (ref 3.5–5.0)
Alkaline Phosphatase: 158 U/L — ABNORMAL HIGH (ref 38–126)
Anion gap: 10 (ref 5–15)
BUN: 11 mg/dL (ref 6–20)
CO2: 28 mmol/L (ref 22–32)
Calcium: 7.8 mg/dL — ABNORMAL LOW (ref 8.9–10.3)
Chloride: 99 mmol/L (ref 98–111)
Creatinine, Ser: 0.84 mg/dL (ref 0.61–1.24)
GFR, Estimated: >60 mL/min (ref >60)
Glucose, Bld: 97 mg/dL (ref 70–99)
Potassium: 4.1 mmol/L (ref 3.5–5.1)
Sodium: 137 mmol/L (ref 135–145)
Total Bilirubin: 1.5 mg/dL — ABNORMAL HIGH (ref 0.3–1.2)
Total Protein: 7.7 g/dL (ref 6.5–8.1)

## 2020-09-05 LAB — RAPID URINE DRUG SCREEN, HOSP PERFORMED
Amphetamines: NOT DETECTED
Barbiturates: NOT DETECTED
Benzodiazepines: NOT DETECTED
Cocaine: NOT DETECTED
Opiates: NOT DETECTED
Tetrahydrocannabinol: POSITIVE — AB

## 2020-09-05 LAB — ETHANOL: Alcohol, Ethyl (B): 291 mg/dL — ABNORMAL HIGH (ref <10)

## 2020-09-05 MED ORDER — OXYCODONE-ACETAMINOPHEN 5-325 MG PO TABS
1.0000 | ORAL_TABLET | Freq: Once | ORAL | Status: AC
  Administered 2020-09-05: 1 via ORAL
  Filled 2020-09-05: qty 1

## 2020-09-05 MED ORDER — IBUPROFEN 800 MG PO TABS
800.0000 mg | ORAL_TABLET | Freq: Once | ORAL | Status: AC
  Administered 2020-09-05: 800 mg via ORAL
  Filled 2020-09-05: qty 1

## 2020-09-05 NOTE — Consult Note (Signed)
Telepsych Consultation   Reason for Consult:  Psychiatry Reassessment Referring Physician:   Location of [facility] Location of Provider: [facility] Department  Patient Identification: [facility] Pinette MRN:  [id number] Principal Diagnosis: Alcohol-induced mood disorder (HCC) Diagnosis:  Principal Problem:   Alcohol-induced mood disorder (HCC)   Total Time spent with patient: 30 minutes  Subjective:   [facility] Datta is a 40 y.o. male patient.  He states "I feel a whole lot better today, I know that I said some things while I was drinking that I did not mean."  HPI:   Craig Andrews states "I fell from a deck last Tuesday and they gave me pain medication, I did not want to take it but then I got to self-medicating with alcohol to take the pain away."  He states he suffered a black eye, received staples in his head and has several broken toes stemming from fall from deck.  Patient reports on yesterday he used alcohol then made statements regarding suicidality.  Today he denies suicidal and homicidal ideations.  He denies any history of suicide attempts, denies self-harm behaviors.  He denies auditory and visual hallucinations.  There is no evidence of delusional thought content and he denies symptoms of paranoia.  Craig Andrews reports he has been using alcohol consistently for approximately 3 years.  He reports he is ready for change.  He did complete [facility]'s program in July 2021 but reports he "never fully stopped drinking."  He reports currently he is planning to stop alcohol completely and plans to attend AA meetings.  He also has insight regarding situation states "I have to change some of the people I hang around including my brother, we both start drinking and do not know when to stop."  He denies any history of mental illness.  He denies any current psychotropic medications.  He is not currently followed by outpatient psychiatry.  He is willing to  follow-up with outpatient psychiatry.  [place]  He denies access to weapons.  He is currently employed at a [employer].  He endorses average sleep and appetite.  He endorses chronic alcohol use, denies substance use aside from alcohol.  Patient offered support and encouragement.  He gives verbal consent to speak with his mother, Coolidge BreezeLisa Andrews phone number [phone].  Patient's mother reports concern for patient's alcohol use and states "I think he would go home and drink again."  Patient's mother also denies any knowledge of patient having access to weapons.  Past Psychiatric History: Alcohol use disorder  Risk to Self:   Denies Risk to Others:   Denies Prior Inpatient Therapy:    Prior Outpatient Therapy:  None reported  Past Medical History:  Past Medical History:  Diagnosis Date  . Allergy   . Hypertension     Past Surgical History:  Procedure Laterality Date  . FRACTURE SURGERY    . rod inside leg     Family History: History reviewed. No pertinent family history. Family Psychiatric  History: None reported Social History:  Social History   Substance and Sexual Activity  Alcohol Use Yes   Comment: half gallon of liquir in 5 days     Social History   Substance and Sexual Activity  Drug Use Yes  . Frequency: 2.0 times per week  . Types: Marijuana    Social History   Socioeconomic History  . Marital status: Single    Spouse name: Not on file  . Number of children: Not  on file  . Years of education: Not on file  . Highest education level: Not on file  Occupational History  . Not on file  Tobacco Use  . Smoking status: Current Every Day Smoker    Packs/day: 0.50    Types: Cigarettes, Cigars  . Smokeless tobacco: Never Used  Vaping Use  . Vaping Use: Never used  Substance and Sexual Activity  . Alcohol use: Yes    Comment: half gallon of liquir in 5 days  . Drug use: Yes    Frequency: 2.0 times per week    Types: Marijuana  . Sexual  activity: Not on file  Other Topics Concern  . Not on file  Social History Narrative  . Not on file   Social Determinants of Health   Financial Resource Strain: Not on file  Food Insecurity: Not on file  Transportation Needs: Not on file  Physical Activity: Not on file  Stress: Not on file  Social Connections: Not on file   Additional Social History:    Allergies:  No Known Allergies  Labs:  Results for orders placed or performed during the hospital encounter of 09/05/20 (from the past 48 hour(s))  Rapid urine drug screen (hospital performed)     Status: Abnormal   Collection Time: 09/05/20  2:35 AM  Result Value Ref Range   Opiates NONE DETECTED NONE DETECTED   Cocaine NONE DETECTED NONE DETECTED   Benzodiazepines NONE DETECTED NONE DETECTED   Amphetamines NONE DETECTED NONE DETECTED   Tetrahydrocannabinol POSITIVE (A) NONE DETECTED   Barbiturates NONE DETECTED NONE DETECTED    Comment: (NOTE) DRUG SCREEN FOR MEDICAL PURPOSES ONLY.  IF CONFIRMATION IS NEEDED FOR ANY PURPOSE, NOTIFY LAB WITHIN 5 DAYS.  LOWEST DETECTABLE LIMITS FOR URINE DRUG SCREEN Drug Class                     Cutoff (ng/mL) Amphetamine and metabolites    1000 Barbiturate and metabolites    200 Benzodiazepine                 200 Tricyclics and metabolites     300 Opiates and metabolites        300 Cocaine and metabolites        300 THC                            50 Performed at [facility], [address], [place], [place] [address]   CBC with Differential     Status: Abnormal   Collection Time: 09/05/20  2:46 AM  Result Value Ref Range   WBC 10.3 4.0 - 10.5 K/uL   RBC 3.63 (L) 4.22 - 5.81 MIL/uL   Hemoglobin 12.3 (L) 13.0 - 17.0 g/dL   HCT 90.3 (L) 00.9 - 23.3 %   MCV 101.4 (H) 80.0 - 100.0 fL   MCH 33.9 26.0 - 34.0 pg   MCHC 33.4 30.0 - 36.0 g/dL   RDW 00.7 (H) 62.2 - 63.3 %   Platelets 117 (L) 150 - 400 K/uL    Comment: SPECIMEN CHECKED FOR CLOTS Immature Platelet Fraction may  be clinically indicated, consider ordering this additional test [id number] PLATELET COUNT CONFIRMED BY SMEAR    nRBC 0.0 0.0 - 0.2 %   Neutrophils Relative % 67 %   Neutro Abs 6.9 1.7 - 7.7 K/uL   Lymphocytes Relative 22 %   Lymphs Abs 2.3 0.7 - 4.0 K/uL  Monocytes Relative 9 %   Monocytes Absolute 0.9 0.1 - 1.0 K/uL   Eosinophils Relative 1 %   Eosinophils Absolute 0.1 0.0 - 0.5 K/uL   Basophils Relative 1 %   Basophils Absolute 0.1 0.0 - 0.1 K/uL   Immature Granulocytes 0 %   Abs Immature Granulocytes 0.03 0.00 - 0.07 K/uL    Comment: Performed at [facility], [address], [place], [place] [address]  Comprehensive metabolic panel     Status: Abnormal   Collection Time: 09/05/20  2:46 AM  Result Value Ref Range   Sodium 137 135 - 145 mmol/L   Potassium 4.1 3.5 - 5.1 mmol/L   Chloride 99 98 - 111 mmol/L   CO2 28 22 - 32 mmol/L   Glucose, Bld 97 70 - 99 mg/dL    Comment: Glucose reference range applies only to samples taken after fasting for at least 8 hours.   BUN 11 6 - 20 mg/dL   Creatinine, Ser 6.54 0.61 - 1.24 mg/dL   Calcium 7.8 (L) 8.9 - 10.3 mg/dL   Total Protein 7.7 6.5 - 8.1 g/dL   Albumin 3.3 (L) 3.5 - 5.0 g/dL   AST 650 (H) 15 - 41 U/L   ALT 87 (H) 0 - 44 U/L   Alkaline Phosphatase 158 (H) 38 - 126 U/L   Total Bilirubin 1.5 (H) 0.3 - 1.2 mg/dL   GFR, Estimated >35 >46 mL/min    Comment: (NOTE) Calculated using the CKD-EPI Creatinine Equation (2021)    Anion gap 10 5 - 15    Comment: Performed at [facility], [address], [place], [place] [address]  Ethanol     Status: Abnormal   Collection Time: 09/05/20  2:46 AM  Result Value Ref Range   Alcohol, Ethyl (B) 291 (H) <10 mg/dL    Comment: (NOTE) Lowest detectable limit for serum alcohol is 10 mg/dL.  For medical purposes only. Performed at [facility], [address] [place], [place] [address]     Medications:  No current facility-administered medications for this encounter.   Current  Outpatient Medications  Medication Sig Dispense Refill  . Multiple Vitamin (MULTIVITAMIN WITH MINERALS) TABS tablet Take 1 tablet by mouth daily.    Marland Kitchen amLODipine (NORVASC) 5 MG tablet Take 5 mg by mouth daily. (Patient not taking: No sig reported)    . fluticasone (FLONASE) 50 MCG/ACT nasal spray Place 1-2 sprays into both nostrils daily. (Patient not taking: No sig reported)    . loratadine (CLARITIN) 10 MG tablet Take 10 mg by mouth daily as needed for allergies. (Patient not taking: No sig reported)    . traZODone (DESYREL) 50 MG tablet Take 50 mg by mouth at bedtime as needed for sleep. (Patient not taking: No sig reported)      Musculoskeletal: Strength & Muscle Tone: within normal limits Gait & Station: normal Patient leans: N/A  Psychiatric Specialty Exam: Physical Exam Vitals and nursing note reviewed.  Constitutional:      Appearance: He is well-developed.  HENT:     Head: Normocephalic.  Cardiovascular:     Rate and Rhythm: Normal rate.  Pulmonary:     Effort: Pulmonary effort is normal.  Neurological:     Mental Status: He is alert and oriented to person, place, and time.  Psychiatric:        Attention and Perception: Attention and perception normal.        Mood and Affect: Mood and affect normal.  Speech: Speech normal.        Behavior: Behavior normal. Behavior is cooperative.        Thought Content: Thought content normal.        Cognition and Memory: Cognition and memory normal.        Judgment: Judgment normal.     Review of Systems  Constitutional: Negative.   HENT: Negative.   Eyes: Negative.   Respiratory: Negative.   Cardiovascular: Negative.   Gastrointestinal: Negative.   Genitourinary: Negative.   Musculoskeletal: Negative.   Skin: Negative.   Neurological: Negative.   Psychiatric/Behavioral: Negative.     Blood pressure 127/86, pulse 90, temperature 98.6 F (37 C), temperature source Oral, resp. rate 18, height 5\' 11"  (1.803 m), weight  83.9 kg, SpO2 96 %.Body mass index is 25.8 kg/m.  General Appearance: Casual  Eye Contact:  Good  Speech:  Clear and Coherent and Normal Rate  Volume:  Normal  Mood:  Euthymic  Affect:  Appropriate and Congruent  Thought Process:  Coherent, Goal Directed and Descriptions of Associations: Intact  Orientation:  Full (Time, Place, and Person)  Thought Content:  WDL and Logical  Suicidal Thoughts:  No  Homicidal Thoughts:  No  Memory:  Immediate;   Good Recent;   Good Remote;   Good  Judgement:  Fair  Insight:  Good  Psychomotor Activity:  Normal  Concentration:  Concentration: Good and Attention Span: Good  Recall:  Good  Fund of Knowledge:  Good  Language:  Good  Akathisia:  No  Handed:  Right  AIMS (if indicated):     Assets:  Communication Skills Desire for Improvement Financial Resources/Insurance Housing Intimacy Leisure Time Physical Health Resilience Social Support Talents/Skills Transportation  ADL's:  Intact  Cognition:  WNL  Sleep:        Treatment Plan Summary: Plan patient reviewed with Dr .  Follow-up with outpatient psychiatry, resources provided. Social work consult initiated to provide substance use treatment resources. Patient cleared by psychiatry.   Disposition: No evidence of imminent risk to self or others at present.   Patient does not meet criteria for psychiatric inpatient admission. Supportive therapy provided about ongoing stressors. Discussed crisis plan, support from social network, calling 911, coming to the Emergency Department, and calling Suicide Hotline.  This service was provided via telemedicine using a 2-way, interactive audio and video technology.  Names of all persons participating in this telemedicine service and their role in this encounter. Name: Shakil Dirk Role: Pateint  Name: Craig [facility]- via telephone Role: Patient's mother  Name: [name] Role: FNP  Name: Dr Doran Heater Role: Psychiatry    Bronwen Betters,  FNP 09/05/2020 2:54 PM

## 2020-09-05 NOTE — ED Notes (Signed)
Pt finished with TTS at this time.

## 2020-09-05 NOTE — ED Provider Notes (Signed)
[facility] EMERGENCY DEPARTMENT Provider Note   CSN: [id number] Arrival date & time: 09/04/20  2245     History Chief Complaint  Patient presents with  . Head Injury    Craig Andrews is a 40 y.o. male.  Patient here with 2 complaints.  His main complaint is that he has persistent headache and confusion since hit his head last week.  He states that this does not seem to have gotten any better and he has some short-term memory loss along with it.  He states that his headaches are intermittent.  Get better with medications.  He is also seen at [facility] a couple days ago and found to have some toe fractures that are probably related to the same incident.  No other obvious severe injuries.  Has not seen anybody in follow-up for any of this yet.  Patient's family also called and stated that he has been making multiple suicidal threats and thoughts recently.  When I discussed this with the patient he states when he is drinking he has these thoughts.  At been going on for many many years and this is not new.  He states when he is sober he does not have those thoughts and does not think that he would attempt suicide.  Is a self-described alcoholic.   Head Injury      Past Medical History:  Diagnosis Date  . Allergy   . Hypertension     Patient Active Problem List   Diagnosis Date Noted  . Right knee pain 01/01/2015    Past Surgical History:  Procedure Laterality Date  . FRACTURE SURGERY    . rod inside leg         History reviewed. No pertinent family history.  Social History   Tobacco Use  . Smoking status: Current Every Day Smoker    Packs/day: 0.50    Types: Cigarettes, Cigars  . Smokeless tobacco: Never Used  Vaping Use  . Vaping Use: Never used  Substance Use Topics  . Alcohol use: Yes    Comment: half gallon of liquir in 5 days  . Drug use: Yes    Frequency: 2.0 times per week    Types: Marijuana    Home Medications Prior to Admission medications    Medication Sig Start Date End Date Taking? Authorizing Provider  amLODipine (NORVASC) 5 MG tablet Take 5 mg by mouth daily.    [provider]  fluticasone (FLONASE) 50 MCG/ACT nasal spray Place 1-2 sprays into both nostrils daily. 02/19/20 02/18/21  [provider]  loratadine (CLARITIN) 10 MG tablet Take 10 mg by mouth daily as needed for allergies.    [provider]  Multiple Vitamin (MULTIVITAMIN WITH MINERALS) TABS tablet Take 1 tablet by mouth daily.    [provider]  thiamine 100 MG tablet Take 100 mg by mouth daily.    [provider]  traZODone (DESYREL) 50 MG tablet Take 50 mg by mouth at bedtime as needed for sleep.    [provider]    Allergies    Patient has no known allergies.  Review of Systems   Review of Systems  All other systems reviewed and are negative.   Physical Exam Updated Vital Signs BP (!) 131/91   Pulse (!) 115   Temp 98.6 F (37 C) (Oral)   Resp 18   Ht 5\' 11"  (1.803 m)   Wt 83.9 kg   SpO2 94%   BMI 25.80 kg/m   Physical Exam  Vitals and nursing note reviewed.  Constitutional:      Appearance: He is well-developed.  HENT:     Head: Normocephalic.     Comments: Abrasion and ecchymosis around left eye.    Mouth/Throat:     Mouth: Mucous membranes are moist.     Pharynx: Oropharynx is clear.  Eyes:     Pupils: Pupils are equal, round, and reactive to light.  Cardiovascular:     Rate and Rhythm: Normal rate.  Pulmonary:     Effort: Pulmonary effort is normal. No respiratory distress.  Abdominal:     General: Abdomen is flat. There is no distension.  Musculoskeletal:        General: Normal range of motion.     Cervical back: Normal range of motion.  Skin:    General: Skin is warm and dry.     Coloration: Skin is not jaundiced or pale.     Comments: Abrasion and scabbing to right knee.  Neurological:     General: No focal deficit present.     Mental Status: He is alert.      ED Results / Procedures / Treatments   Labs (all labs ordered are listed, but only abnormal results are displayed) Labs Reviewed  CBC WITH DIFFERENTIAL/PLATELET - Abnormal; Notable for the following components:      Result Value   RBC 3.63 (*)    Hemoglobin 12.3 (*)    HCT 36.8 (*)    MCV 101.4 (*)    RDW 18.7 (*)    Platelets 117 (*)    All other components within normal limits  COMPREHENSIVE METABOLIC PANEL - Abnormal; Notable for the following components:   Calcium 7.8 (*)    Albumin 3.3 (*)    AST 269 (*)    ALT 87 (*)    Alkaline Phosphatase 158 (*)    Total Bilirubin 1.5 (*)    All other components within normal limits  ETHANOL - Abnormal; Notable for the following components:   Alcohol, Ethyl (B) 291 (*)    All other components within normal limits  RAPID URINE DRUG SCREEN, HOSP PERFORMED    EKG None  Radiology No results found.  Procedures Procedures   Medications Ordered in ED Medications  oxyCODONE-acetaminophen (PERCOCET/ROXICET) 5-325 MG per tablet 1 tablet (1 tablet Oral Given 09/05/20 0551)  ibuprofen (ADVIL) tablet 800 mg (800 mg Oral Given 09/05/20 0551)    ED Course  I have reviewed the triage vital signs and the nursing notes.  Pertinent labs & imaging results that were available during my care of the patient were reviewed by me and considered in my medical decision making (see chart for details).    MDM Rules/Calculators/A&P                          I think the patient has a postconcussive syndrome.  Needs to see neurology for this does not need to be done inpatient though.  No indication for further work-up from that standpoint.  Secondary to the psychiatric component of things we will consult TTS.  At this time patient seems to be making appropriate decisions.  He acknowledges his statements and states he does not feel that way when he is sober and does not think he would go through with it.  I do not see any reason to involuntarily commit  him prior to TTS consultation.  Final Clinical Impression(s) / ED Diagnoses Final diagnoses:  None  Rx / DC Orders ED Discharge Orders    None       Domanik Rainville, Barbara Cower, MD 09/05/20 [phone]

## 2020-09-05 NOTE — BH Assessment (Addendum)
Comprehensive Clinical Assessment (CCA) Note  09/05/2020 Craig Andrews [id number] Disposition: Clinician discussed Craig Andrews care with Craig Nixon, NP.  Craig Andrews recommends that Craig Andrews be observed and then seen by psychiatry later in the morning.  Clinician informed RN [name] via secure messaging.    Pt says he fell from a porch on Tuesday (04/26) and he received 3 staples to his head. He was given pain killers. Due to his previous experience in rehab, he said he took two of them. He drinks and knows that he did not need to take the two together. So he and his Andrews flushed the rest of the oxycodone down the toilet. He says that "any suicidal things I might have said I did not mean." Pt now denies any SI, HI or A/V hallucinations.  Craig Andrews had been calling his Andrews tonight multiple times. He wanted his Andrews to come and help him out. Craig Andrews called [facility] dept about checking on him. Craig Andrews came on over to [facility] voluntarily. He denies any SI at this time. Family is concerned about Craig Andrews safety. Craig Andrews says that he does say things when he is drunk. He says he does not always remember what he says when he is drink. [name] admits that he drinks a lot. He says "I go on a binge every now and then." He has been to [facility] in July of '21. Craig Andrews denies any current SI, Hi or A/V hallucinations.  Craig Andrews gave permission for clinician to talk to his Craig Andrews.  Craig Andrews said that Craig Andrews has been drinking every day this past week since he fell on 04/26.  Craig Andrews yesterday had told his aunt and his Craig Andrews that he was going to kill himself.  Craig Andrews said that Craig Andrews had told her he was going to cut his throat or stab himself in the head while he was on the phone with her.  Tonight (05/04) he called his Andrews saying he was going to kill himself.  Craig Andrews warned that he will say that he was only suicidal when drinking.  Craig Andrews said that he has been saying suicidal statements when he is  sober.  Craig Andrews has been depressed since his stepfather died in 2018/07/13.  Craig Andrews said that Craig Andrews took it really hard.  His depression and suicidal thoughts have gotten worse since he fell on 04/26.  Craig Andrews has been isolating and picking fights (verbal) with his Craig Andrews.  Craig Andrews said that "It is a really screwed up situation."  Craig Andrews is scared of getting a phone call that he has completed suicide.  Craig Andrews has no current outpatient care.  He says that he was told about meetings near his house when he was discharged from [facility] but he has not gone to any.    Craig Andrews has a sad affect but good eye contact and is oriented x4.  Craig Andrews is not responding to internal stimuli.  Craig Andrews does not evidence any delusional thought process.  He minimizes SI.  Craig Andrews reports not getting sleep today.  Appetite is WNL per Craig Andrews but Craig Andrews said he is not eating well.  Craig Andrews reports feeling confused at times.     Chief Complaint:  Chief Complaint  Craig Andrews presents with  . Head Injury   Visit Diagnosis: MDD recurrent, severe; ETOH use d/o severe; Cannabis use d/o moderate   CCA Screening, Triage and Referral (STR)  Craig Andrews Reported Information How did you hear about [place]? Legal System (Pt volunteered to come to [facility].)  Referral name: Dondrell Loudermilk (Andrews)  called the [facility] to Craig house.  Referral phone number: No data recorded  Whom do you see for routine medical problems? Primary Care  Practice/Facility Name: [facility]  Practice/Facility Phone Number: No data recorded Name of Contact: Dr. Sammuel Hines Number: No data recorded Contact Fax Number: No data recorded Prescriber Name: No data recorded Prescriber Address (if known): No data recorded  What Is the Reason for Your Visit/Call Today? Pt says he fell from a porch on Tuesday (04/26) and he received 3 staples to his head.  He was given pain killers.  Due to his previous experience in rehab, he  said he took two of them.  He drinks and knows that he did not need to take the two together.  So he and his Andrews flushed the rest of the oxycodone down the toilet.  He says that "any suicidal things I might have said I did not mean."  Pt now denies any SI, HI or A/V hallucinations.  How Long Has This Been Causing You Problems? <Week  What Do You Feel Would Help You the Most Today? Alcohol or Drug Use Treatment   Have You Recently Been in Any Inpatient Treatment (Hospital/Detox/Crisis Center/28-Day Program)? No  Name/Location of Program/Hospital:No data recorded How Long Were You There? No data recorded When Were You Discharged? No data recorded  Have You Ever Received Services From [facility] Before? Yes  Who Do You See at [facility]? [facility]   Have You Recently Had Any Thoughts About Hurting Yourself? Yes (Pt has been making suicidal statements when inebriated.)  Are You Planning to Commit Suicide/Harm Yourself At This time? No   Have you Recently Had Thoughts About Hurting Someone Craig Andrews? No  Explanation: Pt used alcohol prior to arrival   Have You Used Any Alcohol or Drugs in the Past 24 Hours? Yes  How Long Ago Did You Use Drugs or Alcohol? 0100  What Did You Use and How Much? He brought a half gallon of whiskey on Monday and finished it off tonight (05/04)   Do You Currently Have a Therapist/Psychiatrist? No  Name of Therapist/Psychiatrist: No data recorded  Have You Been Recently Discharged From Any Office Practice or Programs? No  Explanation of Discharge From Practice/Program: No data recorded    CCA Screening Triage Referral Assessment Type of Contact: Tele-Assessment  Is this Initial or Reassessment? Initial Assessment  Date Telepsych consult ordered in [facility]:  09/05/2020  Time Telepsych consult ordered in [facility]:  0236   Craig Andrews Reported Information Reviewed? Yes  Craig Andrews Left Without Being Seen? No data recorded Reason for Not Completing Assessment: No  data recorded  Collateral Involvement: None at this time   Does Craig Andrews Have a Court Appointed Legal Guardian? No data recorded Name and Contact of Legal Guardian: No data recorded If Minor and Not Living with Parent(s), Who has Custody? No data recorded Is CPS involved or ever been involved? Never  Is APS involved or ever been involved? Never   Craig Andrews Determined To Be At Risk for Harm To Self or Others Based on Review of Craig Andrews Reported Information or Presenting Complaint? Yes, for Self-Harm  Method: No data recorded Availability of Means: No data recorded Intent: No data recorded Notification Required: No data recorded Additional Information for Danger to Others Potential: No data recorded Additional Comments for Danger to Others Potential: No data recorded Are There Guns or Other Weapons in Your Home? No data recorded Types of Guns/Weapons: No data recorded Are These  Weapons Safely Secured?                            No data recorded Who Could Verify You Are Able To Have These Secured: No data recorded Do You Have any Outstanding Charges, Pending Court Dates, Parole/Probation? No data recorded Contacted To Inform of Risk of Harm To Self or Others: Other: Comment (NA)   [facility] of Assessment: [facility]   Does Craig Andrews Present under Involuntary Commitment? No  IVC Papers Initial File Date: 01/01/2020   [place] of Residence: [place]   Craig Andrews Currently Receiving the Following Services: Not Receiving Services   Determination of Need: Emergent (2 hours)   Options For Referral: Outpatient Therapy     CCA Biopsychosocial Intake/Chief Complaint:  Craig Andrews had been calling his Andrews tonight multiple times.  He wanted his Andrews to come and help him out.  Craig Andrews called [facility] dept about checking on him.  Craig Andrews came on over to [facility] voluntarily.  He denies any SI at this time.  Family is concerned about Craig Andrews safety.  Craig Andrews says that he does  say things when he is drunk.  He says he does not always remember what he says when he is drink.  [name] admits that he drinks a lot.  He says "I go on a binge every now and then."  He has been to [facility] in July of '21.  Craig Andrews denies any current SI, Hi or A/V hallucinations.  Current Symptoms/Problems: SI statements when inebriated.   Craig Andrews Reported Schizophrenia/Schizoaffective Diagnosis in Past: No data recorded  Strengths: No data recorded Preferences: No data recorded Abilities: No data recorded  Type of Services Craig Andrews Feels are Needed: No data recorded  Initial Clinical Notes/Concerns: No data recorded  Mental Health Symptoms Depression:  Difficulty Concentrating; Hopelessness; Sleep (too much or little)   Duration of Depressive symptoms: Greater than two weeks   Mania:  None   Anxiety:   None   Psychosis:  None   Duration of Psychotic symptoms: No data recorded  Trauma:  None   Obsessions:  None   Compulsions:  None   Inattention:  None   Hyperactivity/Impulsivity:  N/A   Oppositional/Defiant Behaviors:  None   Emotional Irregularity:  Potentially harmful impulsivity   Other Mood/Personality Symptoms:  No data recorded   Mental Status Exam Appearance and self-care  Stature:  Tall   Weight:  Overweight   Clothing:  Casual   Grooming:  Normal   Cosmetic use:  None   Posture/gait:  No data recorded  Motor activity:  -- (Craig Andrews has foot problems due to an injury.)   Sensorium  Attention:  Normal   Concentration:  Scattered (It has been worse since 04/26 when he had a fall.)   Orientation:  X5   Recall/memory:  Defective in Short-term   Affect and Mood  Affect:  Congruent; Depressed   Mood:  Depressed   Relating  Eye contact:  Normal   Facial expression:  Depressed   Attitude toward examiner:  Cooperative   Thought and Language  Speech flow: Clear and Coherent   Thought content:  Appropriate to Mood and  Circumstances   Preoccupation:  None   Hallucinations:  None   Organization:  No data recorded  [employer] of Knowledge:  Average   Intelligence:  Average   Abstraction:  Normal   Judgement:  Fair   Brewing technologist  Insight:  Fair   Decision Making:  Normal   Social Functioning  Social Maturity:  Isolates   Social Judgement:  Normal   Stress  Stressors:  Illness   Coping Ability:  Human resources officerverwhelmed   Skill Deficits:  Decision making   Supports:  Family     Religion:    Leisure/Recreation:    Exercise/Diet: Exercise/Diet Have You Gained or Lost A Significant Amount of Weight in the Past Six Months?: No Do You Follow a Special Diet?: No Do You Have Any Trouble Sleeping?: Yes Explanation of Sleeping Difficulties: Sleeps 6-7 hours   CCA Employment/Education Employment/Work Situation: Employment / Work Situation Employment situation: Employed Where is Craig Andrews currently employed?: [employer] [place] How long has Craig Andrews been employed?: Since 2019 at [employer] Craig job has been impacted by current illness: No Has Craig Andrews ever been in the [employer] and Companymilitary?: No  Education: Education Is Craig Andrews Currently Attending School?: No   CCA Family/Childhood History Family and Relationship History: Family history Marital status: Divorced Divorced, when?: 2005 , happily divorced. Does Craig Andrews have children?: Yes How many children?: 1  Childhood History:  Childhood History By whom was/is the Craig Andrews raised?: Craig Andrews,Craig Andrews/father and step-parent (Craig Andrews and stepfather) Does Craig Andrews have siblings?: Yes (One Andrews) Number of Siblings: 1 Did Craig Andrews suffer any verbal/emotional/physical/sexual abuse as a child?: No Did Craig Andrews suffer from severe childhood neglect?: No Has Craig Andrews ever been sexually abused/assaulted/raped as an adolescent or adult?: No Was the Craig Andrews ever a victim of a crime or a disaster?: No Witnessed domestic violence?:  No Has Craig Andrews been affected by domestic violence as an adult?: No  Child/Adolescent Assessment:     CCA Substance Use Alcohol/Drug Use: Alcohol / Drug Use Pain Medications: None Prescriptions: None Over the Counter: Allergy medications History of alcohol / drug use?: Yes Longest period of sobriety (when/how long): Pt was at [facility] in 2021 in July.  Craig Andrews has been sober for 3-4 motnhs before. Negative Consequences of Use: Personal relationships Substance #1 Name of Substance 1: ETOH (whiskey) 1 - Age of First Use: 40 years old 1 - Amount (size/oz): Half a fifth 1 - Frequency: 3-4 times in a week will binge drink 1 - Duration: ongoing 1 - Last Use / Amount: 05/05 1 - Method of Aquiring: purchase 1- Route of Use: oral Substance #2 Name of Substance 2: Marijuana 2 - Age of First Use: 40 years of age 17 - Amount (size/oz): One ounce will last him 3-4 months 2 - Frequency: 2-3 times in a week, 1-2 hits 2 - Duration: on going 2 - Last Use / Amount: 05/04 2 - Method of Aquiring: purchase illegally 2 - Route of Substance Use: inhalation                     ASAM's:  Six Dimensions of Multidimensional Assessment  Dimension 1:  Acute Intoxication and/or Withdrawal Potential:   Dimension 1:  Description of individual's past and current experiences of substance use and withdrawal: (P) Moderate  Dimension 2:  Biomedical Conditions and Complications:      Dimension 3:  Emotional, Behavioral, or Cognitive Conditions and Complications:     Dimension 4:  Readiness to Change:     Dimension 5:  Relapse, Continued use, or Continued Problem Potential:     Dimension 6:  Recovery/Living Environment:     ASAM Severity Score:    ASAM Recommended Level of Treatment:     Substance use Disorder (SUD)    Recommendations for Services/Supports/Treatments:  DSM5 Diagnoses: Craig Andrews Active Problem List   Diagnosis Date Noted  . Right knee pain 01/01/2015    Craig Andrews  Centered Plan: Craig Andrews is on the following Treatment Plan(s):  Depression, Impulse Control and Substance Abuse   Referrals to Alternative Service(s): Referred to Alternative Service(s):   Place:   Date:   Time:    Referred to Alternative Service(s):   Place:   Date:   Time:    Referred to Alternative Service(s):   Place:   Date:   Time:    Referred to Alternative Service(s):   Place:   Date:   Time:     Wandra Mannan

## 2020-09-05 NOTE — ED Notes (Signed)
Pt doing TTS assessment at this time.  ?

## 2020-09-05 NOTE — ED Notes (Signed)
Three staples removed from scalp.  Scabbed area to lac.  No bleeding noted and site wnl.

## 2020-09-05 NOTE — Progress Notes (Signed)
CSW was asked to follow up with [facility] as per the morning meeting at [facility].  CSW unable to speak with [facility] and provide information without a consent.  CSW attempted to call and speak with the patient.  CSW was placed on hold for several minutes until the line began to ring and an automated voice said person was unavailable. CSW will attempt again later.   Penni Homans, MSW, LCSW 09/05/2020 12:03 PM

## 2020-09-05 NOTE — ED Notes (Signed)
TTS in progress 

## 2020-09-05 NOTE — Progress Notes (Signed)
CSW was provided pt's number via the pt's nurse.  CSW called pt at [phone]. CSW contacted the patient to discuss [facility].  Patient reports that at this time he CAN NOT afford [facility].  He declined for CSW to reach out to them.  CSW pointed out that pt's insurance may be of assistance, however, pt continued to decline.    He reports [facility] "is a temporary fix" and "I just need to stop drinking".  Pt declined any assistance with establishing MH outpatient or medication management treatment.  He declined offers to assist from this CSW.   Pt reported that he has attended one AA/NA meeting following leaving [facility].  CSW offered to assist with getting the schedule and pt declined stating he has it already.  He reports "it's supposed to be listed in my chart that I can't have narcotics and the lady over at [facility] gave me Oxycodone and I got that prescription filled. After that I just turned to the bottle."  CSW explained that should pt change his mind, CSW can assist pt with establishing aftercare.   Penni Homans, MSW, LCSW 09/05/2020 12:27 PM

## 2020-09-05 NOTE — ED Provider Notes (Addendum)
Patient has been cleared for discharge by TTS. He is requesting Work Note for toe fractures diagnosed at a visit to [facility] on 5/1. Will refer to Ortho if he is not able to return to work by Monday. Substance abuse resources guide provided.   Patient also requesting staple removal from scalp laceration on ~4/24.      Pollyann Savoy, MD 09/05/20 [phone]

## 2020-09-05 NOTE — ED Notes (Addendum)
Pts mother and other family called with concerns that pt has been voicing multiple SI plans over the past few days. Family is concerned with pts safety and are requesting pt be evaluated by psych.

## 2021-03-28 ENCOUNTER — Other Ambulatory Visit: Payer: Self-pay

## 2021-03-28 ENCOUNTER — Inpatient Hospital Stay (HOSPITAL_COMMUNITY)
Admission: EM | Admit: 2021-03-28 | Discharge: 2021-04-03 | DRG: 432 | Disposition: A | Discharge status: Home / Self Care | Payer: Self-pay | Attending: Family Medicine | Admitting: Family Medicine

## 2021-03-28 ENCOUNTER — Emergency Department (HOSPITAL_COMMUNITY): Payer: Self-pay

## 2021-03-28 DIAGNOSIS — E876 Hypokalemia: Secondary | ICD-10-CM | POA: Diagnosis present

## 2021-03-28 DIAGNOSIS — Z79891 Long term (current) use of opiate analgesic: Secondary | ICD-10-CM

## 2021-03-28 DIAGNOSIS — F17210 Nicotine dependence, cigarettes, uncomplicated: Secondary | ICD-10-CM | POA: Diagnosis present

## 2021-03-28 DIAGNOSIS — K652 Spontaneous bacterial peritonitis: Secondary | ICD-10-CM

## 2021-03-28 DIAGNOSIS — Z7141 Alcohol abuse counseling and surveillance of alcoholic: Secondary | ICD-10-CM

## 2021-03-28 DIAGNOSIS — K3189 Other diseases of stomach and duodenum: Secondary | ICD-10-CM | POA: Diagnosis present

## 2021-03-28 DIAGNOSIS — D649 Anemia, unspecified: Secondary | ICD-10-CM | POA: Diagnosis present

## 2021-03-28 DIAGNOSIS — K7010 Alcoholic hepatitis without ascites: Secondary | ICD-10-CM | POA: Diagnosis present

## 2021-03-28 DIAGNOSIS — K7030 Alcoholic cirrhosis of liver without ascites: Secondary | ICD-10-CM | POA: Diagnosis present

## 2021-03-28 DIAGNOSIS — D509 Iron deficiency anemia, unspecified: Secondary | ICD-10-CM | POA: Diagnosis present

## 2021-03-28 DIAGNOSIS — J90 Pleural effusion, not elsewhere classified: Secondary | ICD-10-CM | POA: Diagnosis present

## 2021-03-28 DIAGNOSIS — K7011 Alcoholic hepatitis with ascites: Principal | ICD-10-CM

## 2021-03-28 DIAGNOSIS — K769 Liver disease, unspecified: Secondary | ICD-10-CM

## 2021-03-28 DIAGNOSIS — K7031 Alcoholic cirrhosis of liver with ascites: Secondary | ICD-10-CM

## 2021-03-28 DIAGNOSIS — R188 Other ascites: Secondary | ICD-10-CM | POA: Diagnosis present

## 2021-03-28 DIAGNOSIS — D696 Thrombocytopenia, unspecified: Secondary | ICD-10-CM | POA: Diagnosis present

## 2021-03-28 DIAGNOSIS — F1010 Alcohol abuse, uncomplicated: Secondary | ICD-10-CM | POA: Diagnosis present

## 2021-03-28 DIAGNOSIS — Z20822 Contact with and (suspected) exposure to covid-19: Secondary | ICD-10-CM | POA: Diagnosis present

## 2021-03-28 DIAGNOSIS — I700 Atherosclerosis of aorta: Secondary | ICD-10-CM | POA: Diagnosis present

## 2021-03-28 DIAGNOSIS — Z811 Family history of alcohol abuse and dependence: Secondary | ICD-10-CM

## 2021-03-28 DIAGNOSIS — D539 Nutritional anemia, unspecified: Secondary | ICD-10-CM | POA: Diagnosis present

## 2021-03-28 DIAGNOSIS — I8510 Secondary esophageal varices without bleeding: Secondary | ICD-10-CM | POA: Diagnosis present

## 2021-03-28 DIAGNOSIS — Z79899 Other long term (current) drug therapy: Secondary | ICD-10-CM

## 2021-03-28 DIAGNOSIS — I10 Essential (primary) hypertension: Secondary | ICD-10-CM | POA: Diagnosis present

## 2021-03-28 DIAGNOSIS — J811 Chronic pulmonary edema: Secondary | ICD-10-CM | POA: Diagnosis present

## 2021-03-28 DIAGNOSIS — K766 Portal hypertension: Secondary | ICD-10-CM | POA: Diagnosis present

## 2021-03-28 DIAGNOSIS — R0602 Shortness of breath: Secondary | ICD-10-CM | POA: Diagnosis present

## 2021-03-28 DIAGNOSIS — E871 Hypo-osmolality and hyponatremia: Secondary | ICD-10-CM | POA: Diagnosis present

## 2021-03-28 LAB — ETHANOL: Alcohol, Ethyl (B): <10 mg/dL (ref <10)

## 2021-03-28 LAB — RESP PANEL BY RT-PCR (FLU A&B, COVID) ARPGX2
Influenza A by PCR: NEGATIVE
Influenza B by PCR: NEGATIVE
SARS Coronavirus 2 by RT PCR: NEGATIVE

## 2021-03-28 LAB — MRSA NEXT GEN BY PCR, NASAL: MRSA by PCR Next Gen: NOT DETECTED

## 2021-03-28 LAB — POC OCCULT BLOOD, ED: Fecal Occult Bld: NEGATIVE

## 2021-03-28 LAB — CBC WITH DIFFERENTIAL/PLATELET
Abs Immature Granulocytes: 0.06 10*3/uL (ref 0.00–0.07)
Basophils Absolute: 0.1 10*3/uL (ref 0.0–0.1)
Basophils Relative: 1 %
Eosinophils Absolute: 0.2 10*3/uL (ref 0.0–0.5)
Eosinophils Relative: 2 %
HCT: 21.7 % — ABNORMAL LOW (ref 39.0–52.0)
Hemoglobin: 6.6 g/dL — CL (ref 13.0–17.0)
Immature Granulocytes: 1 %
Lymphocytes Relative: 9 %
Lymphs Abs: 1 10*3/uL (ref 0.7–4.0)
MCH: 33.2 pg (ref 26.0–34.0)
MCHC: 30.4 g/dL (ref 30.0–36.0)
MCV: 109 fL — ABNORMAL HIGH (ref 80.0–100.0)
Monocytes Absolute: 0.9 10*3/uL (ref 0.1–1.0)
Monocytes Relative: 8 %
Neutro Abs: 9.4 10*3/uL — ABNORMAL HIGH (ref 1.7–7.7)
Neutrophils Relative %: 79 %
Platelets: 142 10*3/uL — ABNORMAL LOW (ref 150–400)
RBC: 1.99 MIL/uL — ABNORMAL LOW (ref 4.22–5.81)
RDW: 19.2 % — ABNORMAL HIGH (ref 11.5–15.5)
WBC: 11.6 10*3/uL — ABNORMAL HIGH (ref 4.0–10.5)
nRBC: 0 % (ref 0.0–0.2)

## 2021-03-28 LAB — FERRITIN: Ferritin: 50 ng/mL (ref 24–336)

## 2021-03-28 LAB — IRON AND TIBC
Iron: 27 ug/dL — ABNORMAL LOW (ref 45–182)
Saturation Ratios: 9 % — ABNORMAL LOW (ref 17.9–39.5)
TIBC: 311 ug/dL (ref 250–450)
UIBC: 284 ug/dL

## 2021-03-28 LAB — RETICULOCYTES
Immature Retic Fract: 32.1 % — ABNORMAL HIGH (ref 2.3–15.9)
RBC.: 2.03 MIL/uL — ABNORMAL LOW (ref 4.22–5.81)
Retic Count, Absolute: 139.5 10*3/uL (ref 19.0–186.0)
Retic Ct Pct: 6.9 % — ABNORMAL HIGH (ref 0.4–3.1)

## 2021-03-28 LAB — COMPREHENSIVE METABOLIC PANEL
ALT: 46 U/L — ABNORMAL HIGH (ref 0–44)
AST: 101 U/L — ABNORMAL HIGH (ref 15–41)
Albumin: 2 g/dL — ABNORMAL LOW (ref 3.5–5.0)
Alkaline Phosphatase: 110 U/L (ref 38–126)
Anion gap: 9 (ref 5–15)
BUN: 8 mg/dL (ref 6–20)
CO2: 30 mmol/L (ref 22–32)
Calcium: 7.3 mg/dL — ABNORMAL LOW (ref 8.9–10.3)
Chloride: 92 mmol/L — ABNORMAL LOW (ref 98–111)
Creatinine, Ser: 0.84 mg/dL (ref 0.61–1.24)
GFR, Estimated: >60 mL/min (ref >60)
Glucose, Bld: 107 mg/dL — ABNORMAL HIGH (ref 70–99)
Potassium: 2.3 mmol/L — CL (ref 3.5–5.1)
Sodium: 131 mmol/L — ABNORMAL LOW (ref 135–145)
Total Bilirubin: 7.2 mg/dL — ABNORMAL HIGH (ref 0.3–1.2)
Total Protein: 6.3 g/dL — ABNORMAL LOW (ref 6.5–8.1)

## 2021-03-28 LAB — PROTIME-INR
INR: 1.4 — ABNORMAL HIGH (ref 0.8–1.2)
Prothrombin Time: 17.6 seconds — ABNORMAL HIGH (ref 11.4–15.2)

## 2021-03-28 LAB — URINALYSIS, ROUTINE W REFLEX MICROSCOPIC
Glucose, UA: 100 mg/dL — AB
Hgb urine dipstick: NEGATIVE
Ketones, ur: NEGATIVE mg/dL
Leukocytes,Ua: NEGATIVE
Nitrite: NEGATIVE
Protein, ur: NEGATIVE mg/dL
Specific Gravity, Urine: <1.005 — ABNORMAL LOW (ref 1.005–1.030)
pH: 7 (ref 5.0–8.0)

## 2021-03-28 LAB — MAGNESIUM: Magnesium: 1.9 mg/dL (ref 1.7–2.4)

## 2021-03-28 LAB — PHOSPHORUS: Phosphorus: 1.8 mg/dL — ABNORMAL LOW (ref 2.5–4.6)

## 2021-03-28 LAB — RAPID URINE DRUG SCREEN, HOSP PERFORMED
Amphetamines: NOT DETECTED
Barbiturates: NOT DETECTED
Benzodiazepines: NOT DETECTED
Cocaine: NOT DETECTED
Opiates: NOT DETECTED
Tetrahydrocannabinol: POSITIVE — AB

## 2021-03-28 LAB — LIPASE, BLOOD: Lipase: 131 U/L — ABNORMAL HIGH (ref 11–51)

## 2021-03-28 LAB — AMMONIA: Ammonia: 26 umol/L (ref 9–35)

## 2021-03-28 LAB — VITAMIN B12: Vitamin B-12: 1626 pg/mL — ABNORMAL HIGH (ref 180–914)

## 2021-03-28 LAB — PREPARE RBC (CROSSMATCH)

## 2021-03-28 LAB — BILIRUBIN, DIRECT: Bilirubin, Direct: 3.7 mg/dL — ABNORMAL HIGH (ref 0.0–0.2)

## 2021-03-28 LAB — FOLATE: Folate: 22.6 ng/mL (ref >5.9)

## 2021-03-28 LAB — ABO/RH: ABO/RH(D): O POS

## 2021-03-28 MED ORDER — ADULT MULTIVITAMIN W/MINERALS CH
1.0000 | ORAL_TABLET | Freq: Every day | ORAL | Status: DC
  Administered 2021-03-28 – 2021-04-03 (×7): 1 via ORAL
  Filled 2021-03-28 (×7): qty 1

## 2021-03-28 MED ORDER — NICOTINE 21 MG/24HR TD PT24
21.0000 mg | MEDICATED_PATCH | Freq: Every day | TRANSDERMAL | Status: DC
  Administered 2021-03-29 – 2021-04-03 (×6): 21 mg via TRANSDERMAL
  Filled 2021-03-28 (×6): qty 1

## 2021-03-28 MED ORDER — POTASSIUM CHLORIDE CRYS ER 20 MEQ PO TBCR
40.0000 meq | EXTENDED_RELEASE_TABLET | Freq: Once | ORAL | Status: AC
  Administered 2021-03-28: 40 meq via ORAL
  Filled 2021-03-28: qty 2

## 2021-03-28 MED ORDER — POTASSIUM CHLORIDE 10 MEQ/100ML IV SOLN
10.0000 meq | INTRAVENOUS | Status: AC
  Administered 2021-03-28 (×2): 10 meq via INTRAVENOUS
  Filled 2021-03-28 (×2): qty 100

## 2021-03-28 MED ORDER — TRAZODONE HCL 50 MG PO TABS
100.0000 mg | ORAL_TABLET | Freq: Every evening | ORAL | Status: DC | PRN
  Administered 2021-03-28 – 2021-03-31 (×4): 100 mg via ORAL
  Filled 2021-03-28 (×4): qty 2

## 2021-03-28 MED ORDER — POTASSIUM CHLORIDE 10 MEQ/100ML IV SOLN
10.0000 meq | INTRAVENOUS | Status: AC
  Administered 2021-03-28 – 2021-03-29 (×4): 10 meq via INTRAVENOUS
  Filled 2021-03-28 (×3): qty 100

## 2021-03-28 MED ORDER — SODIUM CHLORIDE 0.9 % IV SOLN
2.0000 g | INTRAVENOUS | Status: DC
  Administered 2021-03-28 – 2021-04-02 (×6): 2 g via INTRAVENOUS
  Filled 2021-03-28 (×6): qty 20

## 2021-03-28 MED ORDER — FOLIC ACID 1 MG PO TABS
1.0000 mg | ORAL_TABLET | Freq: Every day | ORAL | Status: DC
  Administered 2021-03-28 – 2021-04-03 (×7): 1 mg via ORAL
  Filled 2021-03-28 (×7): qty 1

## 2021-03-28 MED ORDER — POTASSIUM PHOSPHATES 15 MMOLE/5ML IV SOLN
30.0000 mmol | Freq: Once | INTRAVENOUS | Status: AC
  Administered 2021-03-28: 30 mmol via INTRAVENOUS
  Filled 2021-03-28 (×2): qty 10

## 2021-03-28 MED ORDER — SODIUM CHLORIDE 0.9% IV SOLUTION: Freq: Once | INTRAVENOUS | Status: DC

## 2021-03-28 MED ORDER — IOHEXOL 300 MG/ML  SOLN
100.0000 mL | Freq: Once | INTRAMUSCULAR | Status: AC | PRN
  Administered 2021-03-28: 100 mL via INTRAVENOUS

## 2021-03-28 MED ORDER — SODIUM CHLORIDE 0.9 % IV BOLUS
1000.0000 mL | Freq: Once | INTRAVENOUS | Status: AC
  Administered 2021-03-28: 1000 mL via INTRAVENOUS

## 2021-03-28 MED ORDER — THIAMINE HCL 100 MG PO TABS
100.0000 mg | ORAL_TABLET | Freq: Every day | ORAL | Status: DC
  Administered 2021-03-28 – 2021-04-03 (×7): 100 mg via ORAL
  Filled 2021-03-28 (×7): qty 1

## 2021-03-28 NOTE — Progress Notes (Signed)
Patient ordered to be admitted to ICU, report received from ED nurse, patient coming to ICU room # 3

## 2021-03-28 NOTE — Progress Notes (Signed)
In speaking with patient's family; daughter (who is 40y/o), aunt and mother, they all are agreeable that they want to involved in the patient's care.  The patient's mother, Craig Andrews wants to be the first call if anything happens with the patient and she will pass along information to the rest of the family.

## 2021-03-28 NOTE — ED Notes (Signed)
Tremors noted in bilateral hands.

## 2021-03-28 NOTE — ED Provider Notes (Signed)
[facility] EMERGENCY DEPARTMENT Provider Note   CSN: [id number] Arrival date & time: 03/28/21  1246     History Chief Complaint  Patient presents with   Jaundice    Craig Andrews is a 40 y.o. male.  HPI      Craig Andrews is a 40 y.o. male with a longstanding history of alcohol use.  He presents to the Emergency Department complaining of tremors, abdominal pain and swelling and discoloration of his skin.  Symptoms began in October.  He admits to drinking liquor and beer daily up until October.  He was told by his PCP that he had developing cirrhosis, no follow-up.  Continue to drink at that time.  Noted swelling of his abdomen and gradual yellowing of his skin in October.  States that he stopped drinking after noticing these changes.  Symptoms gradually worsening.  Notes increased swelling of his abdomen with pain in the lower abdominal area.  Straining loose stools which she states were dark in color several weeks ago but now brown.  No hematochezia.  Symptoms have been associated with nausea, no vomiting.  No chest pain or shortness of breath.  No reported seizures or confusion.  Admits to occasional marijuana use denies other illicit drugs.   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  Social History   Tobacco Use   Smoking status: Every Day    Packs/day: 0.50    Types: Cigarettes, Cigars   Smokeless tobacco: Never  Vaping Use   Vaping Use: Never used  Substance Use Topics   Alcohol use: Yes    Comment: half gallon of liquir in 5 days   Drug use: Yes    Frequency: 2.0 times per week    Types: Marijuana    Home Medications Prior to Admission medications   Medication Sig  Start Date End Date Taking? Authorizing Provider  loratadine (CLARITIN) 10 MG tablet Take 10 mg by mouth daily as needed for allergies.   Yes [provider]  Multiple Vitamin (MULTIVITAMIN WITH MINERALS) TABS tablet Take 1 tablet by mouth daily.   Yes [provider]  amLODipine (NORVASC) 5 MG tablet Take 5 mg by mouth daily. Patient not taking: Reported on 09/05/2020    [provider]  fluticasone (FLONASE) 50 MCG/ACT nasal spray Place 1-2 sprays into both nostrils daily. Patient not taking: No sig reported 02/19/20 02/18/21  [provider]  traZODone (DESYREL) 50 MG tablet Take 50 mg by mouth at bedtime as needed for sleep. Patient not taking: Reported on 09/05/2020    [provider]    Allergies    Patient has no known allergies.  Review of Systems   Review of Systems  Constitutional:  Negative for chills, fatigue and fever.  Eyes:  Negative for visual disturbance.  Respiratory:  Negative for cough, chest tightness and shortness of breath.   Cardiovascular:  Negative for chest pain, palpitations and leg swelling.  Gastrointestinal:  Positive for abdominal distention, abdominal pain and diarrhea. Negative for blood in stool, nausea and vomiting.       Dark-colored stools  Genitourinary:  Negative for  difficulty urinating, dysuria and flank pain.  Musculoskeletal:  Negative for arthralgias, back pain, myalgias, neck pain and neck stiffness.  Skin:  Positive for color change. Negative for rash.       Yellow discoloration of skin  Neurological:  Negative for dizziness, syncope, weakness, numbness and headaches.  Hematological:  Does not bruise/bleed easily.  Psychiatric/Behavioral:  Negative for confusion.   All other systems reviewed and are negative.  Physical Exam Updated Vital Signs BP 127/77   Pulse (!) 106   Temp 98.6 F (37 C) (Oral)   Resp 18   Ht 5\' 11"  (1.803 m)   Wt 92.8 kg   SpO2 99%   BMI 28.52 kg/m   Physical  Exam Vitals and nursing note reviewed. Exam conducted with a chaperone present.  Constitutional:      Appearance: He is not toxic-appearing or diaphoretic.  HENT:     Mouth/Throat:     Mouth: Mucous membranes are moist.  Eyes:     General: Scleral icterus present.     Extraocular Movements: Extraocular movements intact.     Pupils: Pupils are equal, round, and reactive to light.  Cardiovascular:     Rate and Rhythm: Normal rate and regular rhythm.     Pulses: Normal pulses.  Pulmonary:     Effort: Pulmonary effort is normal. No respiratory distress.     Breath sounds: Normal breath sounds.  Abdominal:     General: There is distension.     Palpations: Abdomen is soft. There is no fluid wave or hepatomegaly.     Tenderness: There is abdominal tenderness. There is no guarding.     Comments: Abdomen is distended.  Soft, mild tenderness to palpation of the lower abdomen.  No guarding.  No appreciable ascites  Genitourinary:    Rectum: Guaiac result negative. Normal anal tone.     Comments: Chaperoned by nursing staff.  Digital rectal exam performed by me.  Brown heme-negative stool present.  No palpable rectal masses. Musculoskeletal:        General: Normal range of motion.     Right lower leg: No edema.     Left lower leg: No edema.  Skin:    Capillary Refill: Capillary refill takes less than 2 seconds.     Coloration: Skin is jaundiced.     Findings: No rash.  Neurological:     General: No focal deficit present.     Mental Status: He is alert.     GCS: GCS eye subscore is 4. GCS verbal subscore is 5. GCS motor subscore is 6.     Sensory: Sensation is intact. No sensory deficit.     Motor: Motor function is intact. No weakness or pronator drift.     Coordination: Coordination is intact.     Comments: CN II through XII grossly intact.  Speech clear.  Mildly tremulous    ED Results / Procedures / Treatments   Labs (all labs ordered are listed, but only abnormal results are  displayed) Labs Reviewed  CBC WITH DIFFERENTIAL/PLATELET - Abnormal; Notable for the following components:      Result Value   WBC 11.6 (*)    RBC 1.99 (*)    Hemoglobin 6.6 (*)    HCT 21.7 (*)    MCV 109.0 (*)    RDW 19.2 (*)    Platelets 142 (*)    Neutro Abs 9.4 (*)    All other components within normal limits  COMPREHENSIVE METABOLIC PANEL - Abnormal; Notable  for the following components:   Sodium 131 (*)    Potassium 2.3 (*)    Chloride 92 (*)    Glucose, Bld 107 (*)    Calcium 7.3 (*)    Total Protein 6.3 (*)    Albumin 2.0 (*)    AST 101 (*)    ALT 46 (*)    Total Bilirubin 7.2 (*)    All other components within normal limits  LIPASE, BLOOD - Abnormal; Notable for the following components:   Lipase 131 (*)    All other components within normal limits  PHOSPHORUS - Abnormal; Notable for the following components:   Phosphorus 1.8 (*)    All other components within normal limits  RESP PANEL BY RT-PCR (FLU A&B, COVID) ARPGX2  ETHANOL  MAGNESIUM  URINALYSIS, ROUTINE W REFLEX MICROSCOPIC  RAPID URINE DRUG SCREEN, HOSP PERFORMED  PROTIME-INR  HEPATITIS PANEL, ACUTE  POC OCCULT BLOOD, ED  TYPE AND SCREEN  PREPARE RBC (CROSSMATCH)  ABO/RH    EKG EKG Interpretation  Date/Time:  Friday March 28 2021 17:01:28 EST Ventricular Rate:  101 PR Interval:  154 QRS Duration: 81 QT Interval:  396 QTC Calculation: 514 R Axis:   27 Text Interpretation: Sinus tachycardia Low voltage, precordial leads Prolonged QT interval Confirmed by Vanetta Mulders [phone]) on 03/28/2021 5:07:48 PM  Radiology CT Abdomen Pelvis W Contrast  Result Date: 03/28/2021 CLINICAL DATA:  Abdominal distension, nausea, jaundice, alcohol use, increasing jaundice and bowel incontinence, patient reports stopping drinking in October, history hypertension, smoking EXAM: CT ABDOMEN AND PELVIS WITH CONTRAST TECHNIQUE: Multidetector CT imaging of the abdomen and pelvis was performed using the standard  protocol following bolus administration of intravenous contrast. CONTRAST:  OMNIPAQUE IOHEXOL 300 MG/ML SOLN IV. No oral contrast. COMPARISON:  None FINDINGS: Lower chest: Minimal bibasilar atelectasis Hepatobiliary: Mildly thickened gallbladder wall, nonspecific in the setting of ascites. Nodular cirrhotic appearing liver. 6 mm low-attenuation focus LEFT lobe liver question tiny cyst. Small subcapsular mass laterally RIGHT lobe liver 2.1 x 1.9 x 2.0 cm. Question additional nodule medially RIGHT lobe liver adjacent to gallbladder fossa, 3.0 x 2.0 x 1.7 cm. No biliary dilatation. Pancreas: Normal appearance Spleen: Normal appearance Adrenals/Urinary Tract: Adrenal glands, kidneys, ureters, and bladder normal appearance Stomach/Bowel: Normal appendix. Mild wall thickening of distal cecum without evidence of appendicitis. Few small bowel loops in the LEFT mid abdomen is of questionable wall thickening versus artifact from underdistention and ascites. Stomach and bowel loops otherwise normal appearance. Question esophageal versus paraesophageal varices. Vascular/Lymphatic: Atherosclerotic calcifications aorta. Anterior abdominal wall collaterals and recannulization of umbilical vein. Vascular structures patent. No adenopathy. Reproductive: Unremarkable prostate gland and seminal vesicles Other: Ascites, predominantly in pelvis.  No free air.  No hernia. Musculoskeletal: Unremarkable IMPRESSION: Cirrhotic appearing liver with ascites, anterior abdominal wall collaterals, and recannulization of umbilical vein. Question 2 RIGHT lobe liver masses 2.1 and 3.0 cm in greatest sizes, indeterminate; further characterization by non emergent MR imaging with and without contrast recommended. Mildly thickened gallbladder wall, nonspecific in the setting of ascites. Few small bowel loops in the LEFT mid abdomen with questionable wall thickening versus artifact from underdistention and ascites. Aortic Atherosclerosis  (ICD10-I70.0). Electronically Signed   By: Ulyses Southward M.D.   On: 03/28/2021 16:54    Procedures Procedures   Medications Ordered in ED Medications - No data to display  ED Course  I have reviewed the triage vital signs and the nursing notes.  Pertinent labs & imaging results that were available during my  care of the patient were reviewed by me and considered in my medical decision making (see chart for details).   CRITICAL CARE Performed by: Rowan Pollman Total critical care time: 40 minutes Critical care time was exclusive of separately billable procedures and treating other patients. Critical care was necessary to treat or prevent imminent or life-threatening deterioration. Critical care was time spent personally by me on the following activities: development of treatment plan with patient and/or surrogate as well as nursing, discussions with consultants, evaluation of patient's response to treatment, examination of patient, obtaining history from patient or surrogate, ordering and performing treatments and interventions, ordering and review of laboratory studies, ordering and review of radiographic studies, pulse oximetry and re-evaluation of patient's condition.  MDM Rules/Calculators/A&P                           Patient here with tremors, nausea, abdominal pain and distention and history of alcohol use.  Noted yellowing of his skin since October.  Symptoms gradually worsening which prompted his ER visit today.  Last alcohol use was in October.  He also endorses having stringy loose stools that were dark in color 2 weeks ago.  No chest pain shortness of breath, vomiting or confusion.  No history of seizure.  On exam, patient mentating well.  Jaundiced with scleral icterus.  Does not appear to be in active etoh withdrawal.  Lower abdomen is mildly tender, distended, soft   Critical hemoglobin of 6.6.  Heme-negative stool on DRE.  Critical potassium reported.  Patient typed and  screened, will transfuse 2 units, IV potassium ordered.  Patient will require admission  Remaining labs interpreted by me, blood alcohol less than 10, magnesium unremarkable.  Phosphorus low at 1.8.  Lipase mildly elevated at 131, LFTs mildly elevated.  Total bilirubin elevated at 7.2.  EKG without any acute ischemic change.  Prolonged QT.  CT abdomen pelvis shows cirrhotic appearing liver with ascites, 2 masses of the liver with mildly thickened gallbladder wall.  Patient is critically ill.  Findings discussed with [facility] hospitalist, Dr. Waldron Labs who agrees to admit.  Final Clinical Impression(s) / ED Diagnoses Final diagnoses:  Hypokalemia  Anemia, unspecified type  Alcoholic hepatitis with ascites    Rx / DC Orders ED Discharge Orders     None        Kem Parkinson, PA-C 03/28/21 1807    Fredia Sorrow, MD 04/03/21 0000

## 2021-03-28 NOTE — ED Notes (Signed)
Verified compatibility of Rocephin & Potassium Chloride

## 2021-03-28 NOTE — ED Triage Notes (Signed)
Family encouraged pt to come to ED for increasing jaundice, bowel incontinence. Pt reports he stopped drinking in October due to onset of symptoms.

## 2021-03-28 NOTE — ED Notes (Signed)
Admitting MD at bedside.

## 2021-03-28 NOTE — H&P (Signed)
[facility] H&P   Patient Demographics:    Craig Andrews, is a 40 y.o. male  MRN: [id number]   DOB - 1980-12-07  Admit Date - 03/28/2021  Outpatient Primary MD for the patient is Elenora Gamma, MD (Inactive)  Referring MD/NP/PA: PA Tammy  Patient coming from: Home  Chief Complaint  Patient presents with   Jaundice      HPI:    Craig Andrews  is a 40 y.o. male, with past medical history of longstanding alcohol use, he reports last drink was 02/15/2021, he presents to ED secondary to complaints of tremors, jaundice, abdominal pain, reports symptoms of fatigue started on October, he was told in the past by his PCP he did develop cirrhosis, he continued to drink despite that, he reports swelling in lower extremities, and abdomen over last few weeks, and gradual yellowing of his skin, reports he quit drinking, but symptoms are gradually worsening, he denies any coffee-ground emesis, and any melena or bright red blood per rectum, he reports his stools used to be black in color, but only has been brown, he denies any chest pain or shortness of breath, he denies any illicit drug use besides occasional marijuana. - in ED patient work-up significant for potassium 2.3, sodium 131, phosphorus 1.8, lipase 131, total bilirubin of 7.2, hemoglobin 6.6, with MCV of 109, but Hemoccult negative, platelet count at 140 2K, CT abdomen pelvis significant for ascites, liver cirrhosis and 2 liver lesions, [facility] hospitalist consulted to admit.    Review of systems:    In addition to the HPI above,  No Fever-chills, reports jaundice. No Headache, No changes with Vision or hearing, No problems swallowing food or Liquids, No Chest pain, Cough or Shortness of Breath, Reports some abdominal pain, no Nausea or Vommitting, Bowel movements are regular, No Blood in stool or Urine, No dysuria, No new skin rashes  or bruises, No new joints pains-aches,  No new weakness, tingling, numbness in any extremity, Reports weight gain, mildly fluid weight gain. No polyuria, polydypsia or polyphagia, No significant Mental Stressors.  A full 10 point Review of Systems was done, except as stated above, all other Review of Systems were negative.   With Past History of the following :    Past Medical History:  Diagnosis Date   Allergy    Hypertension       Past Surgical History:  Procedure Laterality Date   FRACTURE SURGERY     rod inside leg        Social History:     Social History   Tobacco Use   Smoking status: Every Day    Packs/day: 0.50    Types: Cigarettes, Cigars   Smokeless tobacco: Never  Substance Use Topics   Alcohol use: Yes    Comment: half gallon of liquir in 5 days        Family History :  Family history reviewed, nonpertinent   Home Medications:   Prior to Admission medications   Medication Sig Start Date End Date Taking? Authorizing Provider  loratadine (CLARITIN) 10 MG tablet Take 10 mg by mouth daily as needed for allergies.   Yes [provider]  Multiple Vitamin (MULTIVITAMIN WITH MINERALS) TABS tablet Take 1 tablet by mouth daily.   Yes [provider]  amLODipine (NORVASC) 5 MG tablet Take 5 mg by mouth daily. Patient not taking: Reported on 09/05/2020    [provider]  fluticasone (FLONASE) 50 MCG/ACT nasal spray Place 1-2 sprays into both nostrils daily. Patient not taking: No sig reported 02/19/20 02/18/21  [provider]  traZODone (DESYREL) 50 MG tablet Take 50 mg by mouth at bedtime as needed for sleep. Patient not taking: Reported on 09/05/2020    [provider]     Allergies:    No Known Allergies   Physical Exam:   Vitals  Blood pressure 117/81, pulse 99, temperature 99.3 F (37.4 C), temperature source Oral, resp. rate (!) 22, height 5\' 11"  (1.803 m), weight 92.8 kg, SpO2 96 %.   1.  General appearing male, laying in bed, no apparent distress.  Jaundiced  2. Normal affect and insight, Not Suicidal or Homicidal, Awake Alert, Oriented X 3.  3. No F.N deficits, ALL C.Nerves Intact, Strength 5/5 all 4 extremities, Sensation intact all 4 extremities, Plantars down going.  4. Ears and Eyes appear Normal, Conjunctivae clear, anicteric, PERRLA. Moist Oral Mucosa.  5. Supple Neck, No JVD, No cervical lymphadenopathy appriciated, No Carotid Bruits.  6. Symmetrical Chest wall movement, Good air movement bilaterally, CTAB.  7. RRR, No Gallops, Rubs or Murmurs, No Parasternal Heave.  +1 edema  8. Positive Bowel Sounds,  No tenderness, No organomegaly appriciated,No rebound -guarding or rigidity.  Moderate ascites present.  9.  No Cyanosis, Normal Skin Turgor, No Skin Rash or Bruise.  10. Good muscle tone,  joints appear normal , no effusions, Normal ROM.  11. No Palpable Lymph Nodes in Neck or Axillae     Data Review:    CBC Recent Labs  Lab 03/28/21 1534  WBC 11.6*  HGB 6.6*  HCT 21.7*  PLT 142*  MCV 109.0*  MCH 33.2  MCHC 30.4  RDW 19.2*  LYMPHSABS 1.0  MONOABS 0.9  EOSABS 0.2  BASOSABS 0.1   ------------------------------------------------------------------------------------------------------------------  Chemistries  Recent Labs  Lab 03/28/21 1534  NA 131*  K 2.3*  CL 92*  CO2 30  GLUCOSE 107*  BUN 8  CREATININE 0.84  CALCIUM 7.3*  MG 1.9  AST 101*  ALT 46*  ALKPHOS 110  BILITOT 7.2*   ------------------------------------------------------------------------------------------------------------------ estimated creatinine clearance is 137.4 mL/min (by C-G formula based on SCr of 0.84 mg/dL). ------------------------------------------------------------------------------------------------------------------ No results for input(s): TSH, T4TOTAL, T3FREE, THYROIDAB in the last 72 hours.  Invalid input(s): FREET3  Coagulation profile Recent  Labs  Lab 03/28/21 1536  INR 1.4*   ------------------------------------------------------------------------------------------------------------------- No results for input(s): DDIMER in the last 72 hours. -------------------------------------------------------------------------------------------------------------------  Cardiac Enzymes No results for input(s): CKMB, TROPONINI, MYOGLOBIN in the last 168 hours.  Invalid input(s): CK ------------------------------------------------------------------------------------------------------------------ No results found for: BNP   ---------------------------------------------------------------------------------------------------------------  Urinalysis    Component Value Date/Time   COLORURINE ORANGE (A) 03/28/2021 1715   APPEARANCEUR CLEAR 03/28/2021 1715   LABSPEC <1.005 (L) 03/28/2021 1715   PHURINE 7.0 03/28/2021 1715   GLUCOSEU 100 (A) 03/28/2021 1715   HGBUR NEGATIVE 03/28/2021 1715   BILIRUBINUR MODERATE (A) 03/28/2021 1715  KETONESUR NEGATIVE 03/28/2021 1715   PROTEINUR NEGATIVE 03/28/2021 1715   NITRITE NEGATIVE 03/28/2021 1715   LEUKOCYTESUR NEGATIVE 03/28/2021 1715    ----------------------------------------------------------------------------------------------------------------   Imaging Results:    CT Abdomen Pelvis W Contrast  Result Date: 03/28/2021 CLINICAL DATA:  Abdominal distension, nausea, jaundice, alcohol use, increasing jaundice and bowel incontinence, patient reports stopping drinking in October, history hypertension, smoking EXAM: CT ABDOMEN AND PELVIS WITH CONTRAST TECHNIQUE: Multidetector CT imaging of the abdomen and pelvis was performed using the standard protocol following bolus administration of intravenous contrast. CONTRAST:  OMNIPAQUE IOHEXOL 300 MG/ML SOLN IV. No oral contrast. COMPARISON:  None FINDINGS: Lower chest: Minimal bibasilar atelectasis Hepatobiliary: Mildly thickened gallbladder  wall, nonspecific in the setting of ascites. Nodular cirrhotic appearing liver. 6 mm low-attenuation focus LEFT lobe liver question tiny cyst. Small subcapsular mass laterally RIGHT lobe liver 2.1 x 1.9 x 2.0 cm. Question additional nodule medially RIGHT lobe liver adjacent to gallbladder fossa, 3.0 x 2.0 x 1.7 cm. No biliary dilatation. Pancreas: Normal appearance Spleen: Normal appearance Adrenals/Urinary Tract: Adrenal glands, kidneys, ureters, and bladder normal appearance Stomach/Bowel: Normal appendix. Mild wall thickening of distal cecum without evidence of appendicitis. Few small bowel loops in the LEFT mid abdomen is of questionable wall thickening versus artifact from underdistention and ascites. Stomach and bowel loops otherwise normal appearance. Question esophageal versus paraesophageal varices. Vascular/Lymphatic: Atherosclerotic calcifications aorta. Anterior abdominal wall collaterals and recannulization of umbilical vein. Vascular structures patent. No adenopathy. Reproductive: Unremarkable prostate gland and seminal vesicles Other: Ascites, predominantly in pelvis.  No free air.  No hernia. Musculoskeletal: Unremarkable IMPRESSION: Cirrhotic appearing liver with ascites, anterior abdominal wall collaterals, and recannulization of umbilical vein. Question 2 RIGHT lobe liver masses 2.1 and 3.0 cm in greatest sizes, indeterminate; further characterization by non emergent MR imaging with and without contrast recommended. Mildly thickened gallbladder wall, nonspecific in the setting of ascites. Few small bowel loops in the LEFT mid abdomen with questionable wall thickening versus artifact from underdistention and ascites. Aortic Atherosclerosis (ICD10-I70.0). Electronically Signed   By: Ulyses Southward M.D.   On: 03/28/2021 16:54      Assessment & Plan:    Principal Problem:   Alcoholic hepatitis Active Problems:   Macrocytic anemia   Hypokalemia   Alcoholic cirrhosis of liver with ascites  (HCC)    Decompensated alcoholic liver cirrhosis with ascites Transaminitis Hyperbilirubinemia Coagulopathy -Patient with evidence of liver cirrhosis, secondary to alcohol abuse, his discriminant score is<32, so we will hold on initiating any steroids pending further GI evaluation. -He is with significant electrolyte abnormalities, so I will hold on initiating diuresis for now, will need to be started on Lasix and Aldactone tomorrow -We will check ammonia level. -He is with ascites, I will keep empirically on Rocephin for now, likely will need paracentesis when IR is available(no urgent need currently)  Liver lesions -Patient with 2 liver lesions and CT scan, will check AFP, will await further GI recommendations  Macrocytic anemia -Hemoccult negative, most likely due to alcohol toxicity, MCV is elevated, will check anemia panel, it was not prior to transfusion  Hypokalemia -Severe, repleted, monitor on telemetry  Hypophosphatemia -Recheck in a.m.  History of alcohol abuse -Last drink was 02/15/2021, no further alcohol consumption since, at this point not at risk for withdrawals. 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  Code Status Full  Likely DC to  home  Condition GUARDED    Consults called: GI requested in EPIC    Admission status: inpatient    Time spent in minutes : 60 minutes   Huey Bienenstock M.D on 03/28/2021 at 6:40 PM   [facility] - Office  [phone]

## 2021-03-29 ENCOUNTER — Encounter (HOSPITAL_COMMUNITY): Payer: Self-pay | Admitting: Internal Medicine

## 2021-03-29 DIAGNOSIS — G9340 Encephalopathy, unspecified: Secondary | ICD-10-CM

## 2021-03-29 DIAGNOSIS — D539 Nutritional anemia, unspecified: Secondary | ICD-10-CM

## 2021-03-29 LAB — PHOSPHORUS: Phosphorus: 3 mg/dL (ref 2.5–4.6)

## 2021-03-29 LAB — BASIC METABOLIC PANEL
Anion gap: 10 (ref 5–15)
BUN: 5 mg/dL — ABNORMAL LOW (ref 6–20)
CO2: 26 mmol/L (ref 22–32)
Calcium: 7.2 mg/dL — ABNORMAL LOW (ref 8.9–10.3)
Chloride: 99 mmol/L (ref 98–111)
Creatinine, Ser: 0.66 mg/dL (ref 0.61–1.24)
GFR, Estimated: >60 mL/min (ref >60)
Glucose, Bld: 105 mg/dL — ABNORMAL HIGH (ref 70–99)
Potassium: 3.2 mmol/L — ABNORMAL LOW (ref 3.5–5.1)
Sodium: 135 mmol/L (ref 135–145)

## 2021-03-29 LAB — HEPATIC FUNCTION PANEL
ALT: 40 U/L (ref 0–44)
AST: 86 U/L — ABNORMAL HIGH (ref 15–41)
Albumin: 1.7 g/dL — ABNORMAL LOW (ref 3.5–5.0)
Alkaline Phosphatase: 93 U/L (ref 38–126)
Bilirubin, Direct: 3.2 mg/dL — ABNORMAL HIGH (ref 0.0–0.2)
Indirect Bilirubin: 3.4 mg/dL — ABNORMAL HIGH (ref 0.3–0.9)
Total Bilirubin: 6.6 mg/dL — ABNORMAL HIGH (ref 0.3–1.2)
Total Protein: 5.7 g/dL — ABNORMAL LOW (ref 6.5–8.1)

## 2021-03-29 LAB — CBC
HCT: 23.7 % — ABNORMAL LOW (ref 39.0–52.0)
Hemoglobin: 7.5 g/dL — ABNORMAL LOW (ref 13.0–17.0)
MCH: 31.6 pg (ref 26.0–34.0)
MCHC: 31.6 g/dL (ref 30.0–36.0)
MCV: 100 fL (ref 80.0–100.0)
Platelets: 118 10*3/uL — ABNORMAL LOW (ref 150–400)
RBC: 2.37 MIL/uL — ABNORMAL LOW (ref 4.22–5.81)
RDW: 21.4 % — ABNORMAL HIGH (ref 11.5–15.5)
WBC: 9.3 10*3/uL (ref 4.0–10.5)
nRBC: 0 % (ref 0.0–0.2)

## 2021-03-29 LAB — HEPATITIS PANEL, ACUTE
HCV Ab: NONREACTIVE
Hep A IgM: NONREACTIVE
Hep B C IgM: NONREACTIVE
Hepatitis B Surface Ag: NONREACTIVE

## 2021-03-29 LAB — MAGNESIUM: Magnesium: 1.8 mg/dL (ref 1.7–2.4)

## 2021-03-29 LAB — HIV ANTIBODY (ROUTINE TESTING W REFLEX): HIV Screen 4th Generation wRfx: NONREACTIVE

## 2021-03-29 LAB — AMMONIA: Ammonia: 56 umol/L — ABNORMAL HIGH (ref 9–35)

## 2021-03-29 MED ORDER — POTASSIUM CHLORIDE 10 MEQ/100ML IV SOLN
INTRAVENOUS | Status: AC
  Filled 2021-03-29: qty 100

## 2021-03-29 MED ORDER — OXYCODONE HCL 5 MG PO TABS
5.0000 mg | ORAL_TABLET | Freq: Four times a day (QID) | ORAL | Status: DC | PRN
  Administered 2021-03-29 (×2): 5 mg via ORAL
  Filled 2021-03-29 (×2): qty 1

## 2021-03-29 MED ORDER — CHLORHEXIDINE GLUCONATE CLOTH 2 % EX PADS
6.0000 | MEDICATED_PAD | Freq: Every day | CUTANEOUS | Status: DC
  Administered 2021-03-29: 6 via TOPICAL

## 2021-03-29 MED ORDER — ALBUMIN HUMAN 25 % IV SOLN
50.0000 g | Freq: Three times a day (TID) | INTRAVENOUS | Status: AC
  Administered 2021-03-29 – 2021-04-01 (×9): 50 g via INTRAVENOUS
  Filled 2021-03-29 (×7): qty 200

## 2021-03-29 MED ORDER — LACTULOSE 10 GM/15ML PO SOLN
10.0000 g | Freq: Two times a day (BID) | ORAL | Status: DC
  Administered 2021-03-29 – 2021-04-03 (×10): 10 g via ORAL
  Filled 2021-03-29 (×11): qty 30

## 2021-03-29 MED ORDER — PANTOPRAZOLE SODIUM 40 MG PO TBEC
40.0000 mg | DELAYED_RELEASE_TABLET | Freq: Two times a day (BID) | ORAL | Status: DC
  Administered 2021-03-29 – 2021-04-03 (×11): 40 mg via ORAL
  Filled 2021-03-29 (×11): qty 1

## 2021-03-29 NOTE — Progress Notes (Signed)
PROGRESS NOTE     Craig Andrews, is a 40 y.o. male, DOB - [birth year], XKP:[id number]  Admit date - 03/28/2021   Admitting Physician Starleen Arms, MD  Outpatient Primary MD for the patient is Elenora Gamma, MD (Inactive)  LOS - 1  Chief Complaint  Patient presents with   Jaundice        Brief Narrative:  40 year old male with history of alcohol abuse admitted with decompensated liver cirrhosis with ascites on 03/28/2021  Assessment & Plan:   Principal Problem:   Alcoholic hepatitis Active Problems:   Macrocytic anemia   Hypokalemia   Alcoholic cirrhosis of liver with ascites (HCC)   1)Alcoholic Liver Cirrhosis--hepatic lesions on CT abdomen and pelvis Gi consult appreciated -discriminant score is<32 -- Check Daily MELD labs - Check  ANA, ASMA, IgG,  ceruloplasmin,  -Liver Doppler pending -AFP pending --Palliative/Diagnostic Paracentesis --check cell count, gram culture, albumin and protein, and cytology. get MRI of the abdomen with IV contrast for eval of liver lesions -Acute viral hepatitis profile negative for hep A, B and C -Ammonia is 56 with patient is not particularly encephalopathic - 2)Iron deficiency anemia--- patient with liver cirrhosis with possible varices would benefit from EGD eval defer to GI service -Hemoglobin up to 7.5 posttransfusion -Stool occult blood is negative -Folate and B12 are not low  3) hypokalemia/hypophosphatemia--- replace and recheck, magnesium WNL  4) longstanding history of alcohol abuse--- strong family history of alcohol abuse -Patient states he has not had an alcoholic intake since 02/15/2021 -Continue folic acid and thiamine  5) thrombocytopenia--- monitor platelets closely in the setting of liver cirrhosis and alcohol abuse -No obvious/active bleeding noted at this time  Disposition/Need for in-Hospital Stay- patient unable to be discharged at this time due to --decompensated liver cirrhosis requiring  paracentesis and further work-up -Anticipate possible discharge home in a couple of days if improves  Status is: Inpatient  Remains inpatient appropriate because: Decompensated liver cirrhosis please see disposition above  Disposition: The patient is from: Home              Anticipated d/c is to: Home              Anticipated d/c date is: 2 days              Patient currently is not medically stable to d/c. Barriers: Not Clinically Stable-   Code Status :  -  Code Status: Full Code   Family Communication:   (patient is alert, awake and coherent)  Discussed with his 43 year old daughter at bedside Consults  :  Gi  DVT Prophylaxis  :   - SCDs   SCDs Start: 03/28/21 2024    Lab Results  Component Value Date   PLT 118 (L) 03/29/2021    Inpatient Medications  Scheduled Meds:  sodium chloride   Intravenous Once   Chlorhexidine Gluconate Cloth  6 each Topical Daily   folic acid  1 mg Oral Daily   lactulose  10 g Oral BID   multivitamin with minerals  1 tablet Oral Daily   nicotine  21 mg Transdermal Daily   pantoprazole  40 mg Oral BID   thiamine  100 mg Oral Daily   Continuous Infusions:  albumin human 50 g (03/29/21 1409)   cefTRIAXone (ROCEPHIN)  IV 2 g (03/28/21 1840)   PRN Meds:.[email or web address]   Anti-infectives (From admission, onward)    Start     Dose/Rate Route Frequency Ordered Stop  03/28/21 1815  cefTRIAXone (ROCEPHIN) 2 g in sodium chloride 0.9 % 100 mL IVPB        2 g 200 mL/hr over 30 Minutes Intravenous Every 24 hours 03/28/21 1801           Subjective: Craig Andrews today has no fevers, no emesis,  No chest pain,  -Complains of abdominal discomfort  Objective: Vitals:   03/29/21 1207 03/29/21 1208 03/29/21 1304 03/29/21 1400  BP:   127/79 124/80  Pulse: 100 100 98 (!) 108  Resp: (!) 21 (!) 21 (!) 38 (!) 26  Temp:      TempSrc:      SpO2: (!) 89% (!) 89% 91% 93%  Weight:      Height:        Intake/Output Summary (Last 24 hours) at  03/29/2021 1510 Last data filed at 03/29/2021 1300 Gross per 24 hour  Intake 1636.08 ml  Output 1150 ml  Net 486.08 ml   Filed Weights   03/28/21 1258  Weight: 92.8 kg   Physical Exam  Gen:- Awake Alert,  in no apparent distress  HEENT:- [place].AT, +ve sclera icterus Neck-Supple Neck,No JVD,.  Lungs-  CTAB , fair symmetrical air movement CV- S1, S2 normal, regular  Abd-  +ve B.Sounds, Abd Soft, abdominal distention with presumed ascites Extremity/Skin:-Jaundice, pedal pulses present  Psych-affect is appropriate, oriented x3 Neuro--weakness, no new focal deficits, no tremors  Data Reviewed: I have personally reviewed following labs and imaging studies  CBC: Recent Labs  Lab 03/28/21 1534 03/29/21 0355  WBC 11.6* 9.3  NEUTROABS 9.4*  --   HGB 6.6* 7.5*  HCT 21.7* 23.7*  MCV 109.0* 100.0  PLT 142* 118*   Basic Metabolic Panel: Recent Labs  Lab 03/28/21 1534 03/29/21 0355 03/29/21 0817  NA 131*  --  135  K 2.3*  --  3.2*  CL 92*  --  99  CO2 30  --  26  GLUCOSE 107*  --  105*  BUN 8  --  5*  CREATININE 0.84  --  0.66  CALCIUM 7.3*  --  7.2*  MG 1.9 1.8  --   PHOS 1.8* 3.0  --    GFR: Estimated Creatinine Clearance: 144.3 mL/min (by C-G formula based on SCr of 0.66 mg/dL). Liver Function Tests: Recent Labs  Lab 03/28/21 1534 03/29/21 0355  AST 101* 86*  ALT 46* 40  ALKPHOS 110 93  BILITOT 7.2* 6.6*  PROT 6.3* 5.7*  ALBUMIN 2.0* 1.7*   Recent Labs  Lab 03/28/21 1534  LIPASE 131*   Recent Labs  Lab 03/28/21 1808 03/29/21 0355  AMMONIA 26 56*   Coagulation Profile: Recent Labs  Lab 03/28/21 1536  INR 1.4*   Cardiac Enzymes: No results for input(s): CKTOTAL, CKMB, CKMBINDEX, TROPONINI in the last 168 hours. BNP (last 3 results) No results for input(s): PROBNP in the last 8760 hours. HbA1C: No results for input(s): HGBA1C in the last 72 hours. CBG: No results for input(s): GLUCAP in the last 168 hours. Lipid Profile: No results for  input(s): CHOL, HDL, LDLCALC, TRIG, CHOLHDL, LDLDIRECT in the last 72 hours. Thyroid Function Tests: No results for input(s): TSH, T4TOTAL, FREET4, T3FREE, THYROIDAB in the last 72 hours. Anemia Panel: Recent Labs    03/28/21 1802  VITAMINB12 1,626*  FOLATE 22.6  FERRITIN 50  TIBC 311  IRON 27*  RETICCTPCT 6.9*   Urine analysis:    Component Value Date/Time   COLORURINE ORANGE (A) 03/28/2021 1715   APPEARANCEUR  CLEAR 03/28/2021 1715   LABSPEC <1.005 (L) 03/28/2021 1715   PHURINE 7.0 03/28/2021 1715   GLUCOSEU 100 (A) 03/28/2021 1715   HGBUR NEGATIVE 03/28/2021 1715   BILIRUBINUR MODERATE (A) 03/28/2021 1715   KETONESUR NEGATIVE 03/28/2021 1715   PROTEINUR NEGATIVE 03/28/2021 1715   NITRITE NEGATIVE 03/28/2021 1715   LEUKOCYTESUR NEGATIVE 03/28/2021 1715   Sepsis Labs: @LABRCNTIP (procalcitonin:4,lacticidven:4)  ) Recent Results (from the past 240 hour(s))  Resp Panel by RT-PCR (Flu A&B, Covid) Nasopharyngeal Swab     Status: None   Collection Time: 03/28/21  5:09 PM   Specimen: Nasopharyngeal Swab; Nasopharyngeal(NP) swabs in vial transport medium  Result Value Ref Range Status   SARS Coronavirus 2 by RT PCR NEGATIVE NEGATIVE Final    Comment: (NOTE) SARS-CoV-2 target nucleic acids are NOT DETECTED.  The SARS-CoV-2 RNA is generally detectable in upper respiratory specimens during the acute phase of infection. The lowest concentration of SARS-CoV-2 viral copies this assay can detect is 138 copies/mL. A negative result does not preclude SARS-Cov-2 infection and should not be used as the sole basis for treatment or other patient management decisions. A negative result may occur with  improper specimen collection/handling, submission of specimen other than nasopharyngeal swab, presence of viral mutation(s) within the areas targeted by this assay, and inadequate number of viral copies(<138 copies/mL). A negative result must be combined with clinical observations, patient  history, and epidemiological information. The expected result is Negative.  Fact Sheet for Patients:  03/30/21  Fact Sheet for Healthcare Providers:  BloggerCourse.com  This test is no t yet approved or cleared by the [employer] FDA and  has been authorized for detection and/or diagnosis of SARS-CoV-2 by FDA under an Emergency Use Authorization (EUA). This EUA will remain  in effect (meaning this test can be used) for the duration of the COVID-19 declaration under Section 564(b)(1) of the Act, 21 U.S.C.section 360bbb-3(b)(1), unless the authorization is terminated  or revoked sooner.       Influenza A by PCR NEGATIVE NEGATIVE Final   Influenza B by PCR NEGATIVE NEGATIVE Final    Comment: (NOTE) The Xpert Xpress SARS-CoV-2/FLU/RSV plus assay is intended as an aid in the diagnosis of influenza from Nasopharyngeal swab specimens and should not be used as a sole basis for treatment. Nasal washings and aspirates are unacceptable for Xpert Xpress SARS-CoV-2/FLU/RSV testing.  Fact Sheet for Patients: [place]  Fact Sheet for Healthcare Providers: BloggerCourse.com  This test is not yet approved or cleared by the [employer] FDA and has been authorized for detection and/or diagnosis of SARS-CoV-2 by FDA under an Emergency Use Authorization (EUA). This EUA will remain in effect (meaning this test can be used) for the duration of the COVID-19 declaration under Section 564(b)(1) of the Act, 21 U.S.C. section 360bbb-3(b)(1), unless the authorization is terminated or revoked.  Performed at [facility] [address], [place], [place]   MRSA Next Gen by PCR, Nasal     Status: None   Collection Time: 03/28/21  7:31 PM   Specimen: Nasal Mucosa; Nasal Swab  Result Value Ref Range Status   MRSA by PCR Next Gen NOT DETECTED NOT DETECTED Final    Comment:  (NOTE) The GeneXpert MRSA Assay (FDA approved for NASAL specimens only), is one component of a comprehensive MRSA colonization surveillance program. It is not intended to diagnose MRSA infection nor to guide or monitor treatment for MRSA infections. Test performance is not FDA approved in patients less than 73 years old.  Performed at [facility], [address] [place], [place] [address]      Radiology Studies: CT Abdomen Pelvis W Contrast  Result Date: 03/28/2021 CLINICAL DATA:  Abdominal distension, nausea, jaundice, alcohol use, increasing jaundice and bowel incontinence, patient reports stopping drinking in October, history hypertension, smoking EXAM: CT ABDOMEN AND PELVIS WITH CONTRAST TECHNIQUE: Multidetector CT imaging of the abdomen and pelvis was performed using the standard protocol following bolus administration of intravenous contrast. CONTRAST:  OMNIPAQUE IOHEXOL 300 MG/ML SOLN IV. No oral contrast. COMPARISON:  None FINDINGS: Lower chest: Minimal bibasilar atelectasis Hepatobiliary: Mildly thickened gallbladder wall, nonspecific in the setting of ascites. Nodular cirrhotic appearing liver. 6 mm low-attenuation focus LEFT lobe liver question tiny cyst. Small subcapsular mass laterally RIGHT lobe liver 2.1 x 1.9 x 2.0 cm. Question additional nodule medially RIGHT lobe liver adjacent to gallbladder fossa, 3.0 x 2.0 x 1.7 cm. No biliary dilatation. Pancreas: Normal appearance Spleen: Normal appearance Adrenals/Urinary Tract: Adrenal glands, kidneys, ureters, and bladder normal appearance Stomach/Bowel: Normal appendix. Mild wall thickening of distal cecum without evidence of appendicitis. Few small bowel loops in the LEFT mid abdomen is of questionable wall thickening versus artifact from underdistention and ascites. Stomach and bowel loops otherwise normal appearance. Question esophageal versus paraesophageal varices. Vascular/Lymphatic: Atherosclerotic calcifications aorta. Anterior  abdominal wall collaterals and recannulization of umbilical vein. Vascular structures patent. No adenopathy. Reproductive: Unremarkable prostate gland and seminal vesicles Other: Ascites, predominantly in pelvis.  No free air.  No hernia. Musculoskeletal: Unremarkable IMPRESSION: Cirrhotic appearing liver with ascites, anterior abdominal wall collaterals, and recannulization of umbilical vein. Question 2 RIGHT lobe liver masses 2.1 and 3.0 cm in greatest sizes, indeterminate; further characterization by non emergent MR imaging with and without contrast recommended. Mildly thickened gallbladder wall, nonspecific in the setting of ascites. Few small bowel loops in the LEFT mid abdomen with questionable wall thickening versus artifact from underdistention and ascites. Aortic Atherosclerosis (ICD10-I70.0). Electronically Signed   By: Ulyses Southward M.D.   On: 03/28/2021 16:54     Scheduled Meds:  sodium chloride   Intravenous Once   Chlorhexidine Gluconate Cloth  6 each Topical Daily   folic acid  1 mg Oral Daily   lactulose  10 g Oral BID   multivitamin with minerals  1 tablet Oral Daily   nicotine  21 mg Transdermal Daily   pantoprazole  40 mg Oral BID   thiamine  100 mg Oral Daily   Continuous Infusions:  albumin human 50 g (03/29/21 1409)   cefTRIAXone (ROCEPHIN)  IV 2 g (03/28/21 1840)     LOS: 1 day   Shon Hale M.D on 03/29/2021 at 3:10 PM  Go to [email or web address] - for contact info  [facility] - Office  [phone]  If 7PM-7AM, please contact night-coverage [email or web address] Password [email or web address] 03/29/2021, 3:10 PM

## 2021-03-29 NOTE — Consult Note (Addendum)
Maylon Peppers, M.D. Gastroenterology & Hepatology                                           Patient Name: Craig Andrews Account #: _0 @   MRN: [id number] Admission Date: 03/28/2021 Date of Evaluation:  03/29/2021 Time of Evaluation: 8:41 AM   Referring Physician: Roxan Hockey, MD  Chief Complaint: Ascites and new onset jaundice  HPI:  This is a 40 y.o. male with history of alcohol abuse and alcoholic liver cirrhosis, hypertension, who was admitted to the hospital after presenting worsening ascites and new onset jaundice.  The patient states that he used to drink alcohol heavily for the last 8 to 9 years.  He reported drinking 1/4 of liquor frequently but not on a daily basis along with frequently drinking beer, at home at least once a day.  He reports that he started feeling sick in October and decided to stop drinking alcohol at that time.  However, he reported that he drank 1 beer a week ago with his brother.  He states that he was concerned as around October 2022 he noticed that he was having new onset jaundice in his skin and face as well as worsening abdominal distention.  He has never had a paracentesis in the past but states that the symptoms are new and has never required diuretics.  Notably, he believes that 2 years ago he was being seen by a GI doctor as an our facility with diagnosed him with liver cirrhosis but he never sought further care for this.  States that he has gained weight as both his abdomen and lower extremities have become more swollen.  Notably, he reports that he had intermittent episodes of melena in the past 3 months but he states that the last time he had an episode of melena was when he was still drinking but after he stopped his liquid intake he has not presented any more melena.  Patient also reports that he has presented some episodes of confusion intermittently in the past but not the last few days.  Patient has not started any new  medications.  The patient denies having any nausea, vomiting, fever, chills, hematochezia,  hematemesis, abdominal distention, abdominal pain, diarrhea, pruritus.  In the ED, he was HD stable and afebrile. Labs were remarkable for hemoglobin of 6.6 with MCV of 109, although cell count was 11.6, platelets were 142, CMP showed a potassium of 2.3, sodium of 131, chloride was 92, creatinine was 0.84, BUN 8, lipase 131, AST 101, ALT 46, albumin 2.0, total bilirubin of 7.2.  INR 1.4.  Patient was transfused 1 unit PRBC.  Acute hepatitis panel was negative.  CT of the abdomen and pelvis with IV contrast showed cirrhotic liver with presence of ascites, and 3 abdominal wall collaterals and recanalization of umbilical vein, questionable to right lobe liver masses that measure 2.1 and 3 cm in size which will require further evaluation with an MRI. Iron studies showed iron level of 27 decreased, iron saturation of 9% decrease, normal ferritin of 50, normal folate of 22, vitamin B12 normal 1626, TIBC was normal 311.  Repeat hemoglobin after transfusion was 7.5.  Notably, patient denies having any DUIs or been incarcerated due to alcohol.  He is willing to stop drinking alcohol but he has not been seeing any support groups for alcoholism.  Last EGD: never Last Colonoscopy: never  FHx: neg for any gastrointestinal/liver disease, grandfather was diagnosed with colon cancer in his 74s and had a family member with pancreatic cancer Social: smokes half to 1 pack a day, smokes marijuana intermittently but no other illicit drug, alcohol use as described above  Past Medical History: SEE CHRONIC ISSSUES: Past Medical History:  Diagnosis Date   Allergy    Hypertension    Past Surgical History:  Past Surgical History:  Procedure Laterality Date   FRACTURE SURGERY     rod inside leg     Family History: No family history on file. Social History:  Social History   Tobacco Use   Smoking status: Every Day     Packs/day: 0.50    Types: Cigarettes, Cigars   Smokeless tobacco: Never  Vaping Use   Vaping Use: Never used  Substance Use Topics   Alcohol use: Yes    Comment: half gallon of liquir in 5 days   Drug use: Yes    Frequency: 2.0 times per week    Types: Marijuana    Home Medications:  Prior to Admission medications   Medication Sig Start Date End Date Taking? Authorizing Provider  loratadine (CLARITIN) 10 MG tablet Take 10 mg by mouth daily as needed for allergies.   Yes [provider]  Multiple Vitamin (MULTIVITAMIN WITH MINERALS) TABS tablet Take 1 tablet by mouth daily.   Yes [provider]  amLODipine (NORVASC) 5 MG tablet Take 5 mg by mouth daily. Patient not taking: Reported on 09/05/2020    [provider]  fluticasone (FLONASE) 50 MCG/ACT nasal spray Place 1-2 sprays into both nostrils daily. Patient not taking: No sig reported 02/19/20 02/18/21  [provider]  traZODone (DESYREL) 50 MG tablet Take 50 mg by mouth at bedtime as needed for sleep. Patient not taking: Reported on 09/05/2020    [provider]    Inpatient Medications:  Current Facility-Administered Medications:    0.9 %  sodium chloride infusion (Manually program via Guardrails IV Fluids), , Intravenous, Once, Elgergawy, Silver Huguenin, MD   cefTRIAXone (ROCEPHIN) 2 g in sodium chloride 0.9 % 100 mL IVPB, 2 g, Intravenous, Q24H, Elgergawy, Silver Huguenin, MD, Last Rate: 200 mL/hr at 03/28/21 1840, 2 g at 03/28/21 1840   Chlorhexidine Gluconate Cloth 2 % PADS 6 each, 6 each, Topical, Daily, Emokpae, Courage, MD   folic acid (FOLVITE) tablet 1 mg, 1 mg, Oral, Daily, Elgergawy, Silver Huguenin, MD, 1 mg at 03/28/21 1609   multivitamin with minerals tablet 1 tablet, 1 tablet, Oral, Daily, Elgergawy, Silver Huguenin, MD, 1 tablet at 03/28/21 1610   nicotine (NICODERM CQ - dosed in mg/24 hours) patch 21 mg, 21 mg, Transdermal, Daily, Elgergawy, Silver Huguenin, MD   thiamine tablet 100 mg, 100 mg, Oral,  Daily, Elgergawy, Silver Huguenin, MD, 100 mg at 03/28/21 1610   traZODone (DESYREL) tablet 100 mg, 100 mg, Oral, QHS PRN, Elgergawy, Silver Huguenin, MD, 100 mg at 03/28/21 2245 Allergies: Patient has no known allergies.  Complete Review of Systems: GENERAL: negative for malaise, night sweats HEENT: No changes in hearing or vision, no nose bleeds or other nasal problems. NECK: Negative for lumps, goiter, pain and significant neck swelling RESPIRATORY: Negative for cough, wheezing CARDIOVASCULAR: Negative for chest pain, leg swelling, palpitations, orthopnea GI: SEE HPI MUSCULOSKELETAL: Negative for joint pain or swelling, back pain, and muscle pain. SKIN: Negative for lesions, rash PSYCH: Negative for sleep disturbance, mood disorder and recent psychosocial stressors. HEMATOLOGY Negative for prolonged bleeding, bruising easily,  and swollen nodes. ENDOCRINE: Negative for cold or heat intolerance, polyuria, polydipsia and goiter. NEURO: negative for tremor, gait imbalance, syncope and seizures. The remainder of the review of systems is noncontributory.  Physical Exam: BP 102/72   Pulse 94   Temp 98.7 F (37.1 C) (Oral)   Resp (!) 25   Ht _0  (1.803 m)   Wt 92.8 kg   SpO2 96%   BMI 28.52 kg/m  GENERAL: The patient is AO x3, in no acute distress. HEENT: Head is normocephalic and atraumatic. EOMI are intact. Mouth is well hydrated and without lesions. NECK: Supple. No masses LUNGS: Clear to auscultation. No presence of rhonchi/wheezing/rales. Adequate chest expansion HEART: RRR, normal s1 and s2. ABDOMEN: Soft, nontender, no guarding, no peritoneal signs. Hsa presence of moderate distention with shifting dullness. BS +. No masses. EXTREMITIES: Without any cyanosis, clubbing, rash, lesions. Has +1 pitting edema in both legs. NEUROLOGIC: AOx3, no focal motor deficit. SKIN: no jaundice, no rashes  Laboratory Data CBC:     Component Value Date/Time   WBC 9.3 03/29/2021 0355   RBC 2.37 (L)  03/29/2021 0355   HGB 7.5 (L) 03/29/2021 0355   HCT 23.7 (L) 03/29/2021 0355   PLT 118 (L) 03/29/2021 0355   MCV 100.0 03/29/2021 0355   MCH 31.6 03/29/2021 0355   MCHC 31.6 03/29/2021 0355   RDW 21.4 (H) 03/29/2021 0355   LYMPHSABS 1.0 03/28/2021 1534   MONOABS 0.9 03/28/2021 1534   EOSABS 0.2 03/28/2021 1534   BASOSABS 0.1 03/28/2021 1534   COAG:  Lab Results  Component Value Date   INR 1.4 (H) 03/28/2021    BMP:  BMP Latest Ref Rng & Units 03/28/2021 09/05/2020 02/22/2020  Glucose 70 - 99 mg/dL 107(H) 97 94  BUN 6 - 20 mg/dL _1 Creatinine 0.61 - 1.24 mg/dL 0.84 0.84 0.85  Sodium 135 - 145 mmol/L 131(L) 137 138  Potassium 3.5 - 5.1 mmol/L 2.3(LL) 4.1 4.1  Chloride 98 - 111 mmol/L 92(L) 99 100  CO2 22 - 32 mmol/L _2 Calcium 8.9 - 10.3 mg/dL 7.3(L) 7.8(L) 7.8(L)    HEPATIC:  Hepatic Function Latest Ref Rng & Units 03/29/2021 03/28/2021 09/05/2020  Total Protein 6.5 - 8.1 g/dL 5.7(L) 6.3(L) 7.7  Albumin 3.5 - 5.0 g/dL 1.7(L) 2.0(L) 3.3(L)  AST 15 - 41 U/L 86(H) 101(H) 269(H)  ALT 0 - 44 U/L 40 46(H) 87(H)  Alk Phosphatase 38 - 126 U/L 93 110 158(H)  Total Bilirubin 0.3 - 1.2 mg/dL 6.6(H) 7.2(H) 1.5(H)  Bilirubin, Direct 0.0 - 0.2 mg/dL 3.2(H) 3.7(H) -    CARDIAC: No results found for: CKTOTAL, CKMB, CKMBINDEX, TROPONINI   Imaging: I personally reviewed and interpreted the available imaging.  Assessment & Plan: Beau Ramsburg is a 40 y.o. male with history of alcohol abuse and alcoholic liver cirrhosis, hypertension, who was admitted to the hospital after presenting worsening ascites and new onset jaundice.  The patient has presence of evidence of cirrhosis on imaging with clinical decompensation.  He is CPT class C with a MELD score is 19.  I suspect that he is concomitantly presenting acute on chronic decompensation due to alcoholic hepatitis.  His Maddrey score is 30 and will not benefit from starting steroids at this moment.  I had a thorough discussion with  the patient and his daughter stressing the importance of alcohol cessation to avoid further decompensation of his disease.  For now, we will continue conservative management and checking his  MELD labs on a daily basis.  He may eventually be a candidate for liver transplant but it would be important to determine if he is willing and is able to abstain from alcohol intake in the future.  In the other hand, the patient was found to have 2 lesions in his liver of unclear etiology.  AFP is pending but I consider it would be important to characterize these lesions with an MRI of the abdomen  with IV contrast while he is hospitalized as this will determine if they correspond to [facility] or other etiology.  Patient has presented ascites as a new symptom for decompensation.  We will need to obtain a liver Doppler to assess flow of portal vein but it will also be important to perform a diagnostic/therapeutic paracentesis to characterize his ascites further and provide symptom relief.  Given his electrolyte derangements, we will hold on starting diuretics today but this will need to be started later.  In terms of his new onset iron deficiency anemia, he has responded to blood transfusion adequately.  It may be reasonable to perform an EGD while he is hospitalized to evaluate for esophageal varices but we will plan to perform this once he is more compensated.  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  We will start him on lactulose for goal of 2-3 bowel movements per day.  - Daily MELD labs - Check hepatitis A/B ab, ANA, ASMA, IgG,  ceruloplasmin,  - Alcohol cessation - Abdominal MRI with and without IV con - F/u AFP result - Diagnostic and therapeutic parancentesis, check cell count, gram stain, culture, albumin and protein and cytology.  Can only remove max of 10 L. -Albumin 50 g every 8 hours IV. - Start lactulose p.o., titrate to achieve 2-3  bowel movements per day -We will consider performing EGD for esophageal varices evaluation during current hospitalization -Pantoprazole 40 mg p.o. twice a day -Check H&H every day -Electrolyte derangement correction by primary hospitalist - Reduce salt intake to <2 g per day - heart healthy diet - Can take Tylenol max of 2 g per day (650 mg q8h) for pain - Avoid NSAIDs for pain - Ensure/protein shake every night before going to sleep  Harvel Quale, MD Gastroenterology and Hepatology [facility]

## 2021-03-30 ENCOUNTER — Inpatient Hospital Stay (HOSPITAL_COMMUNITY): Payer: Self-pay

## 2021-03-30 DIAGNOSIS — D649 Anemia, unspecified: Secondary | ICD-10-CM | POA: Diagnosis present

## 2021-03-30 DIAGNOSIS — R188 Other ascites: Secondary | ICD-10-CM | POA: Diagnosis present

## 2021-03-30 LAB — COMPREHENSIVE METABOLIC PANEL
ALT: 37 U/L (ref 0–44)
AST: 84 U/L — ABNORMAL HIGH (ref 15–41)
Albumin: 2.7 g/dL — ABNORMAL LOW (ref 3.5–5.0)
Alkaline Phosphatase: 85 U/L (ref 38–126)
Anion gap: 7 (ref 5–15)
BUN: <5 mg/dL — ABNORMAL LOW (ref 6–20)
CO2: 26 mmol/L (ref 22–32)
Calcium: 7.9 mg/dL — ABNORMAL LOW (ref 8.9–10.3)
Chloride: 102 mmol/L (ref 98–111)
Creatinine, Ser: 0.67 mg/dL (ref 0.61–1.24)
GFR, Estimated: >60 mL/min (ref >60)
Glucose, Bld: 97 mg/dL (ref 70–99)
Potassium: 3.1 mmol/L — ABNORMAL LOW (ref 3.5–5.1)
Sodium: 135 mmol/L (ref 135–145)
Total Bilirubin: 5.1 mg/dL — ABNORMAL HIGH (ref 0.3–1.2)
Total Protein: 6.1 g/dL — ABNORMAL LOW (ref 6.5–8.1)

## 2021-03-30 LAB — CBC
HCT: 22.7 % — ABNORMAL LOW (ref 39.0–52.0)
Hemoglobin: 7 g/dL — ABNORMAL LOW (ref 13.0–17.0)
MCH: 32.1 pg (ref 26.0–34.0)
MCHC: 30.8 g/dL (ref 30.0–36.0)
MCV: 104.1 fL — ABNORMAL HIGH (ref 80.0–100.0)
Platelets: 112 10*3/uL — ABNORMAL LOW (ref 150–400)
RBC: 2.18 MIL/uL — ABNORMAL LOW (ref 4.22–5.81)
RDW: 21.1 % — ABNORMAL HIGH (ref 11.5–15.5)
WBC: 8 10*3/uL (ref 4.0–10.5)
nRBC: 0 % (ref 0.0–0.2)

## 2021-03-30 LAB — HEPATITIS B SURFACE ANTIBODY,QUALITATIVE: Hep B S Ab: NONREACTIVE

## 2021-03-30 LAB — HEPATITIS A ANTIBODY, TOTAL: hep A Total Ab: REACTIVE — AB

## 2021-03-30 LAB — PREPARE RBC (CROSSMATCH)

## 2021-03-30 LAB — PROTIME-INR
INR: 1.6 — ABNORMAL HIGH (ref 0.8–1.2)
Prothrombin Time: 18.8 seconds — ABNORMAL HIGH (ref 11.4–15.2)

## 2021-03-30 LAB — MAGNESIUM: Magnesium: 1.8 mg/dL (ref 1.7–2.4)

## 2021-03-30 MED ORDER — IBUPROFEN 600 MG PO TABS
600.0000 mg | ORAL_TABLET | Freq: Three times a day (TID) | ORAL | Status: AC | PRN
  Administered 2021-03-30 – 2021-04-01 (×2): 600 mg via ORAL
  Filled 2021-03-30 (×2): qty 1

## 2021-03-30 MED ORDER — SODIUM CHLORIDE 0.9% IV SOLUTION: Freq: Once | INTRAVENOUS | Status: AC

## 2021-03-30 MED ORDER — POTASSIUM CHLORIDE 20 MEQ PO PACK
60.0000 meq | PACK | Freq: Once | ORAL | Status: AC
  Administered 2021-03-30: 12:00:00 60 meq via ORAL
  Filled 2021-03-30: qty 3

## 2021-03-30 NOTE — Progress Notes (Signed)
Craig Andrews, M.D. Gastroenterology & Hepatology   Interval History:  No acute events overnight. The patient feels better compared to yesterday and he states that he has been able to tolerate diet without any problem.  No fever, chills, nausea, vomiting, melena or hematochezia. The patient received 1 unit of PRBC yesterday with adequate response but repeat hemoglobin today was 7.0. Has not undergone MRI yet for paracentesis.  Inpatient Medications:  Current Facility-Administered Medications:    0.9 %  sodium chloride infusion (Manually program via Guardrails IV Fluids), , Intravenous, Once, Elgergawy, Leana Roe, MD   albumin human 25 % solution 50 g, 50 g, Intravenous, Q8H, Marguerita Merles, Verlyn Lambert, MD, Last Rate: 60 mL/hr at 03/30/21 0510, 50 g at 03/30/21 0510   cefTRIAXone (ROCEPHIN) 2 g in sodium chloride 0.9 % 100 mL IVPB, 2 g, Intravenous, Q24H, Elgergawy, Leana Roe, MD, Last Rate: 200 mL/hr at 03/29/21 1707, 2 g at 03/29/21 1707   Chlorhexidine Gluconate Cloth 2 % PADS 6 each, 6 each, Topical, Daily, Emokpae, Courage, MD, 6 each at 03/29/21 4660   folic acid (FOLVITE) tablet 1 mg, 1 mg, Oral, Daily, Elgergawy, Leana Roe, MD, 1 mg at 03/29/21 0926   lactulose (CHRONULAC) 10 GM/15ML solution 10 g, 10 g, Oral, BID, Marguerita Merles, Jacey Pelc, MD, 10 g at 03/29/21 2142   multivitamin with minerals tablet 1 tablet, 1 tablet, Oral, Daily, Elgergawy, Leana Roe, MD, 1 tablet at 03/29/21 5637   nicotine (NICODERM CQ - dosed in mg/24 hours) patch 21 mg, 21 mg, Transdermal, Daily, Elgergawy, Leana Roe, MD, 21 mg at 03/29/21 2942   oxyCODONE (Oxy IR/ROXICODONE) immediate release tablet 5 mg, 5 mg, Oral, Q6H PRN, Mariea Clonts, Courage, MD, 5 mg at 03/29/21 2242   pantoprazole (PROTONIX) EC tablet 40 mg, 40 mg, Oral, BID, Marguerita Merles, Tjuana Vickrey, MD, 40 mg at 03/29/21 2140   potassium chloride (KLOR-CON) packet 60 mEq, 60 mEq, Oral, Once, Johnson, Clanford L, MD   thiamine tablet 100 mg, 100 mg, Oral,  Daily, Elgergawy, Leana Roe, MD, 100 mg at 03/29/21 0926   traZODone (DESYREL) tablet 100 mg, 100 mg, Oral, QHS PRN, Elgergawy, Leana Roe, MD, 100 mg at 03/29/21 2242   I/O    Intake/Output Summary (Last 24 hours) at 03/30/2021 1033 Last data filed at 03/30/2021 0800 Gross per 24 hour  Intake 743.16 ml  Output 1120 ml  Net -376.84 ml     Physical Exam: Temp:  [98.2 F (36.8 C)-99 F (37.2 C)] 98.9 F (37.2 C) (11/27 0723) Pulse Rate:  [97-108] 102 (11/27 0800) Resp:  [14-38] 14 (11/27 0800) BP: (103-142)/(71-98) 127/81 (11/27 0800) SpO2:  [84 %-96 %] 84 % (11/27 0800)  Temp (24hrs), Avg:98.5 F (36.9 C), Min:98.2 F (36.8 C), Max:99 F (37.2 C) GENERAL: The patient is AO x3, in no acute distress. HEENT: Head is normocephalic and atraumatic. EOMI are intact. Mouth is well hydrated and without lesions. NECK: Supple. No masses LUNGS: Clear to auscultation. No presence of rhonchi/wheezing/rales. Adequate chest expansion HEART: RRR, normal s1 and s2. ABDOMEN: Soft, nontender, no guarding, no peritoneal signs. Hsa presence of moderate distention with shifting dullness. BS +. No masses. EXTREMITIES: Without any cyanosis, clubbing, rash, lesions. No edema. NEUROLOGIC: AOx3, no focal motor deficit. SKIN: no jaundice, no rashes  Laboratory Data: CBC:     Component Value Date/Time   WBC 8.0 03/30/2021 0250   RBC 2.18 (L) 03/30/2021 0250   HGB 7.0 (L) 03/30/2021 0250   HCT 22.7 (L) 03/30/2021 0250  PLT 112 (L) 03/30/2021 0250   MCV 104.1 (H) 03/30/2021 0250   MCH 32.1 03/30/2021 0250   MCHC 30.8 03/30/2021 0250   RDW 21.1 (H) 03/30/2021 0250   LYMPHSABS 1.0 03/28/2021 1534   MONOABS 0.9 03/28/2021 1534   EOSABS 0.2 03/28/2021 1534   BASOSABS 0.1 03/28/2021 1534   COAG:  Lab Results  Component Value Date   INR 1.6 (H) 03/30/2021   INR 1.4 (H) 03/28/2021    BMP:  BMP Latest Ref Rng & Units 03/30/2021 03/29/2021 03/28/2021  Glucose 70 - 99 mg/dL 97 105(H) 107(H)  BUN 6  - 20 mg/dL <5(L) 5(L) 8  Creatinine 0.61 - 1.24 mg/dL 0.67 0.66 0.84  Sodium 135 - 145 mmol/L 135 135 131(L)  Potassium 3.5 - 5.1 mmol/L 3.1(L) 3.2(L) 2.3(LL)  Chloride 98 - 111 mmol/L 102 99 92(L)  CO2 22 - 32 mmol/L $RemoveB'26 26 30  '[id number]$ Calcium 8.9 - 10.3 mg/dL 7.9(L) 7.2(L) 7.3(L)    HEPATIC:  Hepatic Function Latest Ref Rng & Units 03/30/2021 03/29/2021 03/28/2021  Total Protein 6.5 - 8.1 g/dL 6.1(L) 5.7(L) 6.3(L)  Albumin 3.5 - 5.0 g/dL 2.7(L) 1.7(L) 2.0(L)  AST 15 - 41 U/L 84(H) 86(H) 101(H)  ALT 0 - 44 U/L 37 40 46(H)  Alk Phosphatase 38 - 126 U/L 85 93 110  Total Bilirubin 0.3 - 1.2 mg/dL 5.1(H) 6.6(H) 7.2(H)  Bilirubin, Direct 0.0 - 0.2 mg/dL - 3.2(H) 3.7(H)    CARDIAC: No results found for: CKTOTAL, CKMB, CKMBINDEX, TROPONINI    Imaging: I personally reviewed and interpreted the available labs, imaging and endoscopic files.   Assessment/Plan: Craig Andrews is a 40 y.o. male with history of alcohol abuse and alcoholic liver cirrhosis, hypertension, who was admitted to the hospital after presenting worsening ascites and new onset jaundice.  The patient has presence of evidence of cirrhosis on imaging with clinical decompensation.  He is CPT class C with a MELD score is 18.  I suspect that he is concomitantly presenting acute on chronic decompensation due to alcoholic hepatitis.  His Maddrey score is 30 and will not benefit from starting steroids at this moment -in fact his total bilirubin is slowly trending down.  I had a thorough discussion with the patient and his daughter stressing the importance of alcohol cessation to avoid further decompensation of his disease.  For now, we will continue conservative management and checking his MELD labs on a daily basis.  He may eventually be a candidate for liver transplant but it would be important to determine if he is willing and is able to abstain from alcohol intake in the future. In the other hand, the patient was found to have 2 lesions in his  liver of unclear etiology.  AFP is pending but I consider it would be important to characterize these lesions with an MRI of the abdomen  with IV contrast while he is hospitalized as this will determine if they correspond to [facility] or other etiology.  I explained to the patient that one of the lesions may preclude him from becoming a liver transplant candidate for now given that it size is borderline (3.0).  However, if this is indeed [facility] he may need to have locoregional therapy to downsize the tumor burden and potentially have an evaluation for liver transplant in the future.   Patient has presented ascites as a new symptom for decompensation. Pending liver Doppler to assess flow of portal vein but it will also be important to perform a diagnostic/therapeutic paracentesis  to characterize his ascites further and provide symptom relief.  Given his electrolyte derangements, we will hold on starting diuretics today but this will need to be started later.   In terms of his new onset iron deficiency anemia, he has responded to blood transfusion adequately.  It may be reasonable to perform an EGD while he is hospitalized to evaluate for esophageal varices but we will plan to perform this once he is more compensated -possibly on Tuesday after he undergoes paracentesis and his electrolytes have been corrected. Will recommend transfusing one unit PRBC today.   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  Should continue lactulose for goal of 2-3 bowel movements per day.   - Daily MELD labs - f/u A/B ab, ANA, ASMA, IgG,  ceruloplasmin, AFP - Alcohol cessation - Abdominal MRI with and without IV con - Diagnostic and therapeutic parancentesis, check cell count, gram stain, culture, albumin and protein and cytology.  Can only remove max of 10 L. -Albumin 50 g every 8 hours IV. - Start lactulose p.o., titrate to achieve 2-3 bowel movements per day -We  will consider performing EGD for esophageal varices evaluation during current hospitalization -Pantoprazole 40 mg p.o. twice a day -Check H&H every day -Electrolyte derangement correction by primary hospitalist - Reduce salt intake to <2 g per day - heart healthy diet - Can take Tylenol max of 2 g per day (650 mg q8h) for pain - Avoid NSAIDs for pain - Ensure/protein shake every night before going to sleep  Craig Peppers, MD Gastroenterology and Hepatology [facility]

## 2021-03-30 NOTE — Progress Notes (Signed)
PROGRESS NOTE   Craig Andrews  BFX:[id number] DOB: Apr 01, 1981 DOA: 03/28/2021 PCP: Elenora Gamma, MD (Inactive)   Chief Complaint  Patient presents with   Jaundice   Level of care: Stepdown  Brief Admission History:  40 y.o. male, with past medical history of longstanding alcohol use, he reports last drink was 02/15/2021, he presents to ED secondary to complaints of tremors, jaundice, abdominal pain, reports symptoms of fatigue started on October, he was told in the past by his PCP he did develop cirrhosis, he continued to drink despite that, he reports swelling in lower extremities, and abdomen over last few weeks, and gradual yellowing of his skin, reports he quit drinking, but symptoms are gradually worsening, he denies any coffee-ground emesis, and any melena or bright red blood per rectum, he reports his stools used to be black in color, but only has been brown, he denies any chest pain or shortness of breath, he denies any illicit drug use besides occasional marijuana.  In ED patient work-up significant for potassium 2.3, sodium 131, phosphorus 1.8, lipase 131, total bilirubin of 7.2, hemoglobin 6.6, with MCV of 109, but Hemoccult negative, platelet count at 140 2K, CT abdomen pelvis significant for ascites, liver cirrhosis and 2 liver lesions, [facility] hospitalist consulted to admit.  Assessment & Plan:   Principal Problem:   Alcoholic hepatitis Active Problems:   Macrocytic anemia   Hypokalemia   Alcoholic cirrhosis of liver with ascites (HCC)  Decompensated liver cirrhosis  - appreciate GI consultation and recommendations - daily MELD labs being followed - Follow up AFP and MRI liver pending to be done 11/28 - Follow ammonia on lactulose  Chronic alcohol abuse  - Pt reports he has abstained from alcohol since mid Oct 2022.   IDA  - Hg down to 7.0 post transfusion  - continue iron supplementation - GI considering inpatient EGD to evaluate varices.  - transfuse 1 unit  PRBC, recheck in AM   Thrombocytopenia  - secondary to chronic liver disease - recheck CBC in AM  Liver lesions - GI team planning MRI liver on 11/28  - follow up AFP testing and diagnostic paracentesis planned for 11/28    DVT prophylaxis: SCDs Code Status: Full  Family Communication: patient  Disposition: transfer to telemetry  Status is: Inpatient  Remains inpatient appropriate because: IV therapies, need for PRBC transfusion, paracentesis, MRI liver    Consultants:  GI   Procedures:  Pending   Antimicrobials:  Ceftriaxone IV    Subjective: Pt without complaints.  No abdominal pain, no alcohol withdrawal symptoms.   Objective: Vitals:   03/30/21 0700 03/30/21 0723 03/30/21 0800 03/30/21 1125  BP: 128/73  127/81   Pulse: 100 100 (!) 102   Resp: (!) 21 19 14    Temp:  98.9 F (37.2 C)  98.6 F (37 C)  TempSrc:  Oral  Oral  SpO2: 93% 94% (!) 84%   Weight:      Height:        Intake/Output Summary (Last 24 hours) at 03/30/2021 1157 Last data filed at 03/30/2021 0800 Gross per 24 hour  Intake 743.16 ml  Output 1120 ml  Net -376.84 ml   Filed Weights   03/28/21 1258  Weight: 92.8 kg    Examination:  General exam: jaundiced skin, Appears calm and comfortable, NAD.  Respiratory system: shallow but clear sounds bilateral.  Cardiovascular system: normal S1 & S2 heard. No JVD, murmurs, rubs, gallops or clicks. No pedal edema. Gastrointestinal system: Abdomen is  distended, tympanitic, soft and nontender.  hepatomegaly or masses felt. Normal bowel sounds heard. Central nervous system: Alert and oriented. No focal neurological deficits. Extremities: Symmetric 5 x 5 power. Skin: diffuse jaundice and bilateral scleral icterus.  Psychiatry: Judgement and insight poor. Mood & affect appropriate.   Data Reviewed: I have personally reviewed following labs and imaging studies  CBC: Recent Labs  Lab 03/28/21 1534 03/29/21 0355 03/30/21 0250  WBC 11.6* 9.3 8.0   NEUTROABS 9.4*  --   --   HGB 6.6* 7.5* 7.0*  HCT 21.7* 23.7* 22.7*  MCV 109.0* 100.0 104.1*  PLT 142* 118* 112*    Basic Metabolic Panel: Recent Labs  Lab 03/28/21 1534 03/29/21 0355 03/29/21 0817 03/30/21 0250  NA 131*  --  135 135  K 2.3*  --  3.2* 3.1*  CL 92*  --  99 102  CO2 30  --  26 26  GLUCOSE 107*  --  105* 97  BUN 8  --  5* <5*  CREATININE 0.84  --  0.66 0.67  CALCIUM 7.3*  --  7.2* 7.9*  MG 1.9 1.8  --  1.8  PHOS 1.8* 3.0  --   --     GFR: Estimated Creatinine Clearance: 144.3 mL/min (by C-G formula based on SCr of 0.67 mg/dL).  Liver Function Tests: Recent Labs  Lab 03/28/21 1534 03/29/21 0355 03/30/21 0250  AST 101* 86* 84*  ALT 46* 40 37  ALKPHOS 110 93 85  BILITOT 7.2* 6.6* 5.1*  PROT 6.3* 5.7* 6.1*  ALBUMIN 2.0* 1.7* 2.7*    CBG: No results for input(s): GLUCAP in the last 168 hours.  Recent Results (from the past 240 hour(s))  Resp Panel by RT-PCR (Flu A&B, Covid) Nasopharyngeal Swab     Status: None   Collection Time: 03/28/21  5:09 PM   Specimen: Nasopharyngeal Swab; Nasopharyngeal(NP) swabs in vial transport medium  Result Value Ref Range Status   SARS Coronavirus 2 by RT PCR NEGATIVE NEGATIVE Final    Comment: (NOTE) SARS-CoV-2 target nucleic acids are NOT DETECTED.  The SARS-CoV-2 RNA is generally detectable in upper respiratory specimens during the acute phase of infection. The lowest concentration of SARS-CoV-2 viral copies this assay can detect is 138 copies/mL. A negative result does not preclude SARS-Cov-2 infection and should not be used as the sole basis for treatment or other patient management decisions. A negative result may occur with  improper specimen collection/handling, submission of specimen other than nasopharyngeal swab, presence of viral mutation(s) within the areas targeted by this assay, and inadequate number of viral copies(<138 copies/mL). A negative result must be combined with clinical observations,  patient history, and epidemiological information. The expected result is Negative.  Fact Sheet for Patients:  BloggerCourse.com  Fact Sheet for Healthcare Providers:  SeriousBroker.it  This test is no t yet approved or cleared by the [place] FDA and  has been authorized for detection and/or diagnosis of SARS-CoV-2 by FDA under an Emergency Use Authorization (EUA). This EUA will remain  in effect (meaning this test can be used) for the duration of the COVID-19 declaration under Section 564(b)(1) of the Act, 21 U.S.C.section 360bbb-3(b)(1), unless the authorization is terminated  or revoked sooner.       Influenza A by PCR NEGATIVE NEGATIVE Final   Influenza B by PCR NEGATIVE NEGATIVE Final    Comment: (NOTE) The Xpert Xpress SARS-CoV-2/FLU/RSV plus assay is intended as an aid in the diagnosis of influenza from Nasopharyngeal swab specimens and  should not be used as a sole basis for treatment. Nasal washings and aspirates are unacceptable for Xpert Xpress SARS-CoV-2/FLU/RSV testing.  Fact Sheet for Patients: BloggerCourse.com  Fact Sheet for Healthcare Providers: SeriousBroker.it  This test is not yet approved or cleared by the [place] FDA and has been authorized for detection and/or diagnosis of SARS-CoV-2 by FDA under an Emergency Use Authorization (EUA). This EUA will remain in effect (meaning this test can be used) for the duration of the COVID-19 declaration under Section 564(b)(1) of the Act, 21 U.S.C. section 360bbb-3(b)(1), unless the authorization is terminated or revoked.  Performed at [facility], [address], [place], [place] [address]   MRSA Next Gen by PCR, Nasal     Status: None   Collection Time: 03/28/21  7:31 PM   Specimen: Nasal Mucosa; Nasal Swab  Result Value Ref Range Status   MRSA by PCR Next Gen NOT DETECTED NOT DETECTED Final     Comment: (NOTE) The GeneXpert MRSA Assay (FDA approved for NASAL specimens only), is one component of a comprehensive MRSA colonization surveillance program. It is not intended to diagnose MRSA infection nor to guide or monitor treatment for MRSA infections. Test performance is not FDA approved in patients less than 32 years old. Performed at [facility], [address] [place], [place] [address]      Radiology Studies: CT Abdomen Pelvis W Contrast  Result Date: 03/28/2021 CLINICAL DATA:  Abdominal distension, nausea, jaundice, alcohol use, increasing jaundice and bowel incontinence, patient reports stopping drinking in October, history hypertension, smoking EXAM: CT ABDOMEN AND PELVIS WITH CONTRAST TECHNIQUE: Multidetector CT imaging of the abdomen and pelvis was performed using the standard protocol following bolus administration of intravenous contrast. CONTRAST:  OMNIPAQUE IOHEXOL 300 MG/ML SOLN IV. No oral contrast. COMPARISON:  None FINDINGS: Lower chest: Minimal bibasilar atelectasis Hepatobiliary: Mildly thickened gallbladder wall, nonspecific in the setting of ascites. Nodular cirrhotic appearing liver. 6 mm low-attenuation focus LEFT lobe liver question tiny cyst. Small subcapsular mass laterally RIGHT lobe liver 2.1 x 1.9 x 2.0 cm. Question additional nodule medially RIGHT lobe liver adjacent to gallbladder fossa, 3.0 x 2.0 x 1.7 cm. No biliary dilatation. Pancreas: Normal appearance Spleen: Normal appearance Adrenals/Urinary Tract: Adrenal glands, kidneys, ureters, and bladder normal appearance Stomach/Bowel: Normal appendix. Mild wall thickening of distal cecum without evidence of appendicitis. Few small bowel loops in the LEFT mid abdomen is of questionable wall thickening versus artifact from underdistention and ascites. Stomach and bowel loops otherwise normal appearance. Question esophageal versus paraesophageal varices. Vascular/Lymphatic: Atherosclerotic calcifications aorta.  Anterior abdominal wall collaterals and recannulization of umbilical vein. Vascular structures patent. No adenopathy. Reproductive: Unremarkable prostate gland and seminal vesicles Other: Ascites, predominantly in pelvis.  No free air.  No hernia. Musculoskeletal: Unremarkable IMPRESSION: Cirrhotic appearing liver with ascites, anterior abdominal wall collaterals, and recannulization of umbilical vein. Question 2 RIGHT lobe liver masses 2.1 and 3.0 cm in greatest sizes, indeterminate; further characterization by non emergent MR imaging with and without contrast recommended. Mildly thickened gallbladder wall, nonspecific in the setting of ascites. Few small bowel loops in the LEFT mid abdomen with questionable wall thickening versus artifact from underdistention and ascites. Aortic Atherosclerosis (ICD10-I70.0). Electronically Signed   By: Ulyses Southward M.D.   On: 03/28/2021 16:54    Scheduled Meds:  sodium chloride   Intravenous Once   Chlorhexidine Gluconate Cloth  6 each Topical Daily   folic acid  1 mg Oral Daily   lactulose  10 g Oral  BID   multivitamin with minerals  1 tablet Oral Daily   nicotine  21 mg Transdermal Daily   pantoprazole  40 mg Oral BID   potassium chloride  60 mEq Oral Once   thiamine  100 mg Oral Daily   Continuous Infusions:  albumin human 50 g (03/30/21 0510)   cefTRIAXone (ROCEPHIN)  IV 2 g (03/29/21 1707)     LOS: 2 days   Time spent: 38 mins   Milynn Quirion Laural Benes, MD How to contact the [facility] Attending or Consulting provider 7A - 7P or covering provider during after hours 7P -7A, for this patient?  Check the care team in [facility] and look for a) attending/consulting [facility] provider listed and b) the [facility] team listed Log into [email or web address] and use [place]'s universal password to access. If you do not have the password, please contact the hospital operator. Locate the [facility] provider you are looking for under [facility] and page to a number that you can be directly  reached. If you still have difficulty reaching the provider, please page the [facility] (Director on Call) for the Hospitalists listed on amion for assistance.  03/30/2021, 11:57 AM

## 2021-03-31 ENCOUNTER — Encounter (HOSPITAL_COMMUNITY): Payer: Self-pay | Admitting: Internal Medicine

## 2021-03-31 ENCOUNTER — Inpatient Hospital Stay (HOSPITAL_COMMUNITY): Payer: Self-pay

## 2021-03-31 DIAGNOSIS — R0602 Shortness of breath: Secondary | ICD-10-CM

## 2021-03-31 DIAGNOSIS — J90 Pleural effusion, not elsewhere classified: Secondary | ICD-10-CM

## 2021-03-31 LAB — CERULOPLASMIN: Ceruloplasmin: 24.5 mg/dL (ref 16.0–31.0)

## 2021-03-31 LAB — TYPE AND SCREEN
ABO/RH(D): O POS
Antibody Screen: NEGATIVE
Unit division: 0
Unit division: 0
Unit division: 0

## 2021-03-31 LAB — COMPREHENSIVE METABOLIC PANEL
ALT: 30 U/L (ref 0–44)
AST: 67 U/L — ABNORMAL HIGH (ref 15–41)
Albumin: 3.4 g/dL — ABNORMAL LOW (ref 3.5–5.0)
Alkaline Phosphatase: 67 U/L (ref 38–126)
Anion gap: 7 (ref 5–15)
BUN: <5 mg/dL — ABNORMAL LOW (ref 6–20)
CO2: 23 mmol/L (ref 22–32)
Calcium: 8.3 mg/dL — ABNORMAL LOW (ref 8.9–10.3)
Chloride: 108 mmol/L (ref 98–111)
Creatinine, Ser: 0.58 mg/dL — ABNORMAL LOW (ref 0.61–1.24)
GFR, Estimated: >60 mL/min (ref >60)
Glucose, Bld: 95 mg/dL (ref 70–99)
Potassium: 3.3 mmol/L — ABNORMAL LOW (ref 3.5–5.1)
Sodium: 138 mmol/L (ref 135–145)
Total Bilirubin: 7.5 mg/dL — ABNORMAL HIGH (ref 0.3–1.2)
Total Protein: 6.3 g/dL — ABNORMAL LOW (ref 6.5–8.1)

## 2021-03-31 LAB — GRAM STAIN

## 2021-03-31 LAB — BPAM RBC
Blood Product Expiration Date: 202211262359
Blood Product Expiration Date: 202212272359
Blood Product Expiration Date: 202212272359
ISSUE DATE / TIME: 202211251808
ISSUE DATE / TIME: 202211252128
ISSUE DATE / TIME: 202211271707
Unit Type and Rh: 5100
Unit Type and Rh: 5100
Unit Type and Rh: 9500

## 2021-03-31 LAB — AMMONIA: Ammonia: 66 umol/L — ABNORMAL HIGH (ref 9–35)

## 2021-03-31 LAB — PROTEIN, PLEURAL OR PERITONEAL FLUID: Total protein, fluid: <3 g/dL

## 2021-03-31 LAB — CBC
HCT: 26.1 % — ABNORMAL LOW (ref 39.0–52.0)
Hemoglobin: 7.8 g/dL — ABNORMAL LOW (ref 13.0–17.0)
MCH: 31.3 pg (ref 26.0–34.0)
MCHC: 29.9 g/dL — ABNORMAL LOW (ref 30.0–36.0)
MCV: 104.8 fL — ABNORMAL HIGH (ref 80.0–100.0)
Platelets: 108 10*3/uL — ABNORMAL LOW (ref 150–400)
RBC: 2.49 MIL/uL — ABNORMAL LOW (ref 4.22–5.81)
RDW: 19.9 % — ABNORMAL HIGH (ref 11.5–15.5)
WBC: 7.5 10*3/uL (ref 4.0–10.5)
nRBC: 0 % (ref 0.0–0.2)

## 2021-03-31 LAB — MAGNESIUM: Magnesium: 1.7 mg/dL (ref 1.7–2.4)

## 2021-03-31 LAB — AFP TUMOR MARKER: AFP, Serum, Tumor Marker: 2.7 ng/mL (ref 0.0–6.9)

## 2021-03-31 LAB — GLUCOSE, PLEURAL OR PERITONEAL FLUID: Glucose, Fluid: 138 mg/dL

## 2021-03-31 LAB — ALBUMIN, PLEURAL OR PERITONEAL FLUID: Albumin, Fluid: <1.5 g/dL

## 2021-03-31 LAB — BODY FLUID CELL COUNT WITH DIFFERENTIAL
Eos, Fluid: 0 %
Lymphs, Fluid: 64 %
Monocyte-Macrophage-Serous Fluid: 34 % — ABNORMAL LOW (ref 50–90)
Neutrophil Count, Fluid: 2 % (ref 0–25)
Total Nucleated Cell Count, Fluid: 274 cu mm (ref 0–1000)

## 2021-03-31 LAB — ANA: Anti Nuclear Antibody (ANA): NEGATIVE

## 2021-03-31 LAB — IGG: IgG (Immunoglobin G), Serum: 1601 mg/dL (ref 603–1613)

## 2021-03-31 LAB — ANTI-SMOOTH MUSCLE ANTIBODY, IGG: F-Actin IgG: 14 Units (ref 0–19)

## 2021-03-31 LAB — PROTIME-INR
INR: 1.7 — ABNORMAL HIGH (ref 0.8–1.2)
Prothrombin Time: 20.4 seconds — ABNORMAL HIGH (ref 11.4–15.2)

## 2021-03-31 MED ORDER — FUROSEMIDE 40 MG PO TABS
40.0000 mg | ORAL_TABLET | Freq: Once | ORAL | Status: AC
  Administered 2021-03-31: 16:00:00 40 mg via ORAL
  Filled 2021-03-31: qty 1

## 2021-03-31 MED ORDER — POTASSIUM CHLORIDE CRYS ER 20 MEQ PO TBCR
40.0000 meq | EXTENDED_RELEASE_TABLET | Freq: Once | ORAL | Status: AC
  Administered 2021-03-31: 19:00:00 40 meq via ORAL
  Filled 2021-03-31: qty 2

## 2021-03-31 MED ORDER — POTASSIUM CHLORIDE 20 MEQ PO PACK
60.0000 meq | PACK | Freq: Once | ORAL | Status: AC
  Administered 2021-03-31: 09:00:00 60 meq via ORAL
  Filled 2021-03-31: qty 3

## 2021-03-31 MED ORDER — MAGNESIUM SULFATE 4 GM/100ML IV SOLN
4.0000 g | Freq: Once | INTRAVENOUS | Status: AC
  Administered 2021-03-31: 16:00:00 4 g via INTRAVENOUS
  Filled 2021-03-31: qty 100

## 2021-03-31 MED ORDER — GADOBUTROL 1 MMOL/ML IV SOLN
10.0000 mL | Freq: Once | INTRAVENOUS | Status: AC | PRN
  Administered 2021-03-31: 10:00:00 10 mL via INTRAVENOUS

## 2021-03-31 NOTE — Plan of Care (Signed)

## 2021-03-31 NOTE — Progress Notes (Signed)
Gram stain with gram positive cocci. Culture pending. Cell count with PMNs less than 250. He has been on antibiotics since admission. Holding on prednisolone in case dealing with SBP. Query possible contaminant, but with history, unable to be sure. Continue with IV empiric antibiotics.   NPO after midnight. Reassess for EGD in morning.

## 2021-03-31 NOTE — Progress Notes (Signed)
Subjective: Tolerating full liquids. No abdominal pain. No BMs. +flatus. No overt GI bleeding. Denies mental status changes or confusion. States he feels he will be able to maintain sobriety as outpatient. Noted prior to alcohol cessation in October, his stool would be black. He has not seen black stool since that time.   Objective: Vital signs in last 24 hours: Temp:  [98.3 F (36.8 C)-100.2 F (37.9 C)] 98.9 F (37.2 C) (11/28 0428) Pulse Rate:  [89-109] 89 (11/28 0428) Resp:  [15-25] 16 (11/28 0428) BP: (115-156)/(75-96) 121/75 (11/28 0428) SpO2:  [89 %-98 %] 90 % (11/28 0428) Last BM Date: 03/27/21 General:   Alert and oriented, jaundiced, acutely ill appearing Head:  Normocephalic and atraumatic. Eyes:  +scleral icterus Abdomen:  Bowel sounds present, distended and tense, no TTP Extremities:  Without edema. Neurologic:  Alert and  oriented x4; mild asterixis Psych:  Alert and cooperative. Normal mood and affect.  Intake/Output from previous day: 11/27 0701 - 11/28 0700 In: 1120.3 [P.O.:600; Blood:413.3; IV Piggyback:107] Out: 2425 [Urine:2425] Intake/Output this shift: No intake/output data recorded.  Lab Results: Recent Labs    03/29/21 0355 03/30/21 0250 03/31/21 0351  WBC 9.3 8.0 7.5  HGB 7.5* 7.0* 7.8*  HCT 23.7* 22.7* 26.1*  PLT 118* 112* 108*   BMET Recent Labs    03/29/21 0817 03/30/21 0250 03/31/21 0351  NA 135 135 138  K 3.2* 3.1* 3.3*  CL 99 102 108  CO2 26 26 23   GLUCOSE 105* 97 95  BUN 5* <5* <5*  CREATININE 0.66 0.67 0.58*  CALCIUM 7.2* 7.9* 8.3*   LFT Recent Labs    03/28/21 1534 03/29/21 0355 03/30/21 0250 03/31/21 0351  PROT 6.3* 5.7* 6.1* 6.3*  ALBUMIN 2.0* 1.7* 2.7* 3.4*  AST 101* 86* 84* 67*  ALT 46* 40 37 30  ALKPHOS 110 93 85 67  BILITOT 7.2* 6.6* 5.1* 7.5*  BILIDIR 3.7* 3.2*  --   --   IBILI  --  3.4*  --   --    PT/INR Recent Labs    03/30/21 0250 03/31/21 0351  LABPROT 18.8* 20.4*  INR 1.6* 1.7*    Hepatitis Panel Recent Labs    03/28/21 1536  HEPBSAG NON REACTIVE  HCVAB NON REACTIVE  HEPAIGM NON REACTIVE  HEPBIGM NON REACTIVE     Studies/Results: 03/30/21 LIVER DOPPLER  Result Date: 03/31/2021 CLINICAL DATA:  Alcoholic cirrhosis EXAM: DUPLEX ULTRASOUND OF LIVER TECHNIQUE: Color and duplex Doppler ultrasound was performed to evaluate the hepatic in-flow and out-flow vessels. Technologist describes technically difficult study secondary to body habitus and overlying bowel gas. COMPARISON:  CT 03/28/2021 FINDINGS: Liver: Heterogenous echotexture.  Nodular contour. The 2 right lobe lesions suspected on recent CT are less conspicuous on this exam. Main Portal Vein size: 1.4 cm Portal Vein Velocities (all hepatopetal): Main Prox:  29 cm/sec Main Mid: 33 cm/sec Main Dist:  37 cm/sec Right: 30 cm/sec Left: 23 cm/sec Hepatic Vein Velocities (all hepatofugal): Right:  48 cm/sec Middle:  36 cm/sec Left:  35 cm/sec IVC: Present and patent with normal respiratory phasicity., 2.7 cm diameter Hepatic Artery Velocity:  129 cm/sec Splenic Vein Velocity:  41 cm/sec Spleen: 10.7 cm x 7.9 cm x 13.9 cm with a total volume of 610 cm^3 (411 cm^3 is upper limit normal) Portal Vein Occlusion/Thrombus: No Splenic Vein Occlusion/Thrombus: No Ascites: Small volume Varices: None identified IMPRESSION: 1. Unremarkable hepatic vascular Doppler evaluation 2. Nodular liver contour suggesting cirrhosis. 3. Small volume abdominal ascites Electronically  Signed   By: Corlis Leak M.D.   On: 03/31/2021 07:40    Assessment: 40 year old male with history of ETOH abuse and cirrhosis, presenting with decompensation in setting of worsening ascites, new onset jaundice, and alcoholic hepatitis. Additionally, he was found to have 2 right lobe liver masses on CT. MELD Na 20 today, Child Pugh Class C. DF calculated with PT average of 13 is 41.5 today.  Cirrhosis: secondary to ETOH. Acute hepatitis panel negative. Immune to Hep A. Needs Hep B  vaccination. Additional serologies pending including ANA, ASMA, ceruloplasmin, IgG. US liver doppler yesterday without thrombus. Small volume ascites noted. He does have mild asterixis on exam but without overt mental status changes. Need to titrate lactulose.   Ascites: on exam, he is distended. [place] para ordered today. I query if he has evolving ileus as well. Denies BM despite BID lactulose, but he is passing flatus and denies any obstructive signs. Continue to monitor and await [place] para results.   Liver lesions: MRI today. AFP remains pending.  Alcoholic hepatitis: DF is worsening and now above 32. No obvious signs of infection. Will await para first to ensure no SBP prior to initiating steroid therapy. Would need to monitor response at 7 days. Transaminases improving. Bilirubin rising; it is expected to have a "lag" effect prior to downward trend.   Anemia: with IDA. Heme negative this admission. Reported black stool  while drinking but none since October. Received 3 units PRBCs. Lowest Hgb 6.6, now 7.8 today. Recommend EGD this admission and colonoscopy as outpatient.  Plan: [place] para today MRI today for liver lesions Follow-up on para fluid analysis Follow-up on pending serologies and AFP Titrate lactulose: monitor for any signs of evolving ileus/obstruction. May need abdominal xray Soft diet after MRI: discussed with nursing staff NPO after midnight Anticipate EGD on 11/29 Decision on prednisolone to be made after [place] para. Would need for 28 days with rapid taper thereafter.     Gelene Mink, PhD, ANP-BC [facility]    LOS: 3 days    03/31/2021, 8:04 AM

## 2021-03-31 NOTE — Progress Notes (Signed)
PROGRESS NOTE   Craig Andrews  TKP:[id number] DOB: 1981-04-08 DOA: 03/28/2021 PCP: Elenora Gamma, MD (Inactive)   Chief Complaint  Patient presents with   Jaundice   Level of care: Telemetry  Brief Admission History:  40 y.o. male, with past medical history of longstanding alcohol use, he reports last drink was 02/15/2021, he presents to ED secondary to complaints of tremors, jaundice, abdominal pain, reports symptoms of fatigue started on October, he was told in the past by his PCP he did develop cirrhosis, he continued to drink despite that, he reports swelling in lower extremities, and abdomen over last few weeks, and gradual yellowing of his skin, reports he quit drinking, but symptoms are gradually worsening, he denies any coffee-ground emesis, and any melena or bright red blood per rectum, he reports his stools used to be black in color, but only has been brown, he denies any chest pain or shortness of breath, he denies any illicit drug use besides occasional marijuana.  In ED patient work-up significant for potassium 2.3, sodium 131, phosphorus 1.8, lipase 131, total bilirubin of 7.2, hemoglobin 6.6, with MCV of 109, but Hemoccult negative, platelet count at 140 2K, CT abdomen pelvis significant for ascites, liver cirrhosis and 2 liver lesions, [facility] consulted to admit.  Assessment & Plan:   Principal Problem:   Alcoholic hepatitis Active Problems:   Macrocytic anemia   Hypokalemia   Alcoholic cirrhosis of liver with ascites (HCC)   Anemia   Ascites  Decompensated liver cirrhosis  - appreciate GI consultation and recommendations - daily MELD labs being followed - Follow up AFP and MRI liver pending to be done 11/28 - Follow ammonia on lactulose - lactulose ordered for goal of 2-3 BMs per day  Chronic alcohol abuse  - Pt reports he has abstained from alcohol since mid Oct 2022.  - Pt says he has no plans to restart alcohol consumption as he does want a liver  transplant  IDA  - Hg down to 7.0 post transfusion  - continue iron supplementation - GI considering inpatient EGD to evaluate varices.  - transfuse total of 3 unit PRBC since admission - GI planning inpatient EGD 11/29 and outpatient colon survey    Thrombocytopenia  - secondary to chronic liver disease - recheck CBC in AM  Hypokalemia - give additional magnesium 11/28 - give additional oral potassium  - recheck in AM   Liver lesions - GI team planning MRI liver on 11/28  - follow up AFP testing and diagnostic paracentesis planned for 11/28  - MRI inconclusive as was poor study, recommended outpatient multiphasic contrast enhanced liver protocol CT - GI team will arrange outpatient study on ambulatory follow up.  Possible SBP - prelim fluid testing from paracentesis G pos cocci  - pt has remained on IV ceftriaxone since admission - continue IV antibiotics and follow cultures  Pleural effusions - Pt had symptoms of SOB when lying flat for MRI and paracentesis - discussed with Dyanne Iha in radiology, repeat CXR ordered - likely to get US thoracentesis done for symptoms  - follow up CXR    DVT prophylaxis: SCDs Code Status: Full  Family Communication: patient  Disposition: transfer to telemetry  Status is: Inpatient  Remains inpatient appropriate because: IV therapies, need for PRBC transfusion, paracentesis, MRI liver    Consultants:  GI   Procedures:  MRI Liver 11/28 Paracentesis 11/28   Antimicrobials:  Ceftriaxone IV    Subjective: Pt reports that he feels fine, he  is alert and oriented.   Objective: Vitals:   03/31/21 1011 03/31/21 1022 03/31/21 1032 03/31/21 1403  BP: 128/88 120/84 121/83   Pulse: (!) 110 (!) 110 (!) 108 100  Resp: 18 18 18    Temp: 98.3 F (36.8 C)     TempSrc: Oral     SpO2: 91% 91% 92% 96%  Weight:      Height:        Intake/Output Summary (Last 24 hours) at 03/31/2021 1405 Last data filed at 03/31/2021 0900 Gross  per 24 hour  Intake 2030.32 ml  Output 2775 ml  Net -744.68 ml   Filed Weights   03/28/21 1258  Weight: 92.8 kg    Examination:  General exam: jaundiced skin, Appears calm and comfortable, NAD.  Respiratory system: shallow but clear sounds bilateral.  Cardiovascular system: normal S1 & S2 heard. No JVD, murmurs, rubs, gallops or clicks. No pedal edema. Gastrointestinal system: Abdomen is distended, tympanitic, soft and nontender.  hepatomegaly or masses felt. Normal bowel sounds heard. Central nervous system: Alert and oriented. No focal neurological deficits. Extremities: Symmetric 5 x 5 power. Skin: diffuse jaundice and bilateral scleral icterus.  Psychiatry: Judgement and insight poor. Mood & affect appropriate.   Data Reviewed: I have personally reviewed following labs and imaging studies  CBC: Recent Labs  Lab 03/28/21 1534 03/29/21 0355 03/30/21 0250 03/31/21 0351  WBC 11.6* 9.3 8.0 7.5  NEUTROABS 9.4*  --   --   --   HGB 6.6* 7.5* 7.0* 7.8*  HCT 21.7* 23.7* 22.7* 26.1*  MCV 109.0* 100.0 104.1* 104.8*  PLT 142* 118* 112* 108*    Basic Metabolic Panel: Recent Labs  Lab 03/28/21 1534 03/29/21 0355 03/29/21 0817 03/30/21 0250 03/31/21 0351  NA 131*  --  135 135 138  K 2.3*  --  3.2* 3.1* 3.3*  CL 92*  --  99 102 108  CO2 30  --  26 26 23   GLUCOSE 107*  --  105* 97 95  BUN 8  --  5* <5* <5*  CREATININE 0.84  --  0.66 0.67 0.58*  CALCIUM 7.3*  --  7.2* 7.9* 8.3*  MG 1.9 1.8  --  1.8 1.7  PHOS 1.8* 3.0  --   --   --     GFR: Estimated Creatinine Clearance: 144.3 mL/min (A) (by C-G formula based on SCr of 0.58 mg/dL (L)).  Liver Function Tests: Recent Labs  Lab 03/28/21 1534 03/29/21 0355 03/30/21 0250 03/31/21 0351  AST 101* 86* 84* 67*  ALT 46* 40 37 30  ALKPHOS 110 93 85 67  BILITOT 7.2* 6.6* 5.1* 7.5*  PROT 6.3* 5.7* 6.1* 6.3*  ALBUMIN 2.0* 1.7* 2.7* 3.4*    CBG: No results for input(s): GLUCAP in the last 168 hours.  Recent Results  (from the past 240 hour(s))  Resp Panel by RT-PCR (Flu A&B, Covid) Nasopharyngeal Swab     Status: None   Collection Time: 03/28/21  5:09 PM   Specimen: Nasopharyngeal Swab; Nasopharyngeal(NP) swabs in vial transport medium  Result Value Ref Range Status   SARS Coronavirus 2 by RT PCR NEGATIVE NEGATIVE Final    Comment: (NOTE) SARS-CoV-2 target nucleic acids are NOT DETECTED.  The SARS-CoV-2 RNA is generally detectable in upper respiratory specimens during the acute phase of infection. The lowest concentration of SARS-CoV-2 viral copies this assay can detect is 138 copies/mL. A negative result does not preclude SARS-Cov-2 infection and should not be used as the sole basis for  treatment or other patient management decisions. A negative result may occur with  improper specimen collection/handling, submission of specimen other than nasopharyngeal swab, presence of viral mutation(s) within the areas targeted by this assay, and inadequate number of viral copies(<138 copies/mL). A negative result must be combined with clinical observations, patient history, and epidemiological information. The expected result is Negative.  Fact Sheet for Patients:  BloggerCourse.com  Fact Sheet for Healthcare Providers:  SeriousBroker.it  This test is no t yet approved or cleared by the [place] FDA and  has been authorized for detection and/or diagnosis of SARS-CoV-2 by FDA under an Emergency Use Authorization (EUA). This EUA will remain  in effect (meaning this test can be used) for the duration of the COVID-19 declaration under Section 564(b)(1) of the Act, 21 U.S.C.section 360bbb-3(b)(1), unless the authorization is terminated  or revoked sooner.       Influenza A by PCR NEGATIVE NEGATIVE Final   Influenza B by PCR NEGATIVE NEGATIVE Final    Comment: (NOTE) The Xpert Xpress SARS-CoV-2/FLU/RSV plus assay is intended as an aid in the  diagnosis of influenza from Nasopharyngeal swab specimens and should not be used as a sole basis for treatment. Nasal washings and aspirates are unacceptable for Xpert Xpress SARS-CoV-2/FLU/RSV testing.  Fact Sheet for Patients: BloggerCourse.com  Fact Sheet for Healthcare Providers: SeriousBroker.it  This test is not yet approved or cleared by the [place] FDA and has been authorized for detection and/or diagnosis of SARS-CoV-2 by FDA under an Emergency Use Authorization (EUA). This EUA will remain in effect (meaning this test can be used) for the duration of the COVID-19 declaration under Section 564(b)(1) of the Act, 21 U.S.C. section 360bbb-3(b)(1), unless the authorization is terminated or revoked.  Performed at [facility], [address], [place], [place] [address]   MRSA Next Gen by PCR, Nasal     Status: None   Collection Time: 03/28/21  7:31 PM   Specimen: Nasal Mucosa; Nasal Swab  Result Value Ref Range Status   MRSA by PCR Next Gen NOT DETECTED NOT DETECTED Final    Comment: (NOTE) The GeneXpert MRSA Assay (FDA approved for NASAL specimens only), is one component of a comprehensive MRSA colonization surveillance program. It is not intended to diagnose MRSA infection nor to guide or monitor treatment for MRSA infections. Test performance is not FDA approved in patients less than 34 years old. Performed at [facility], [address] [place], [place] [address]   Gram stain     Status: None   Collection Time: 03/31/21 10:25 AM   Specimen: Peritoneal Washings  Result Value Ref Range Status   Specimen Description PERITONEAL  Final   Special Requests NONE  Final   Gram Stain   Final    GRAM POSITIVE COCCI WBC PRESENT, PREDOMINANTLY MONONUCLEAR CYTOSPIN SMEAR Gram Stain Report Called to,Read Back By and Verified With: Vivi Martens RN [phone] K FORSYTH Performed at [facility], [address] [place], [place]  [address]    Report Status 03/31/2021 FINAL  Final  Culture, body fluid w Gram Stain-bottle     Status: None (Preliminary result)   Collection Time: 03/31/21 10:25 AM   Specimen: Peritoneal Washings  Result Value Ref Range Status   Specimen Description PERITONEAL ASCITES  Final   Special Requests   Final    BOTTLES DRAWN AEROBIC AND ANAEROBIC Blood Culture results may not be optimal due to an excessive volume of blood received in culture bottles Performed at [facility], [address] [place], [place] [address]    Culture PENDING  Incomplete   Report Status PENDING  Incomplete     Radiology Studies: MR ABDOMEN W WO CONTRAST  Result Date: 03/31/2021 CLINICAL DATA:  Characterize possible liver lesions identified by prior CT EXAM: MRI ABDOMEN WITHOUT AND WITH CONTRAST TECHNIQUE: Multiplanar multisequence MR imaging of the abdomen was performed both before and after the administration of intravenous contrast. CONTRAST:  14mL GADAVIST GADOBUTROL 1 MMOL/ML IV SOLN COMPARISON:  CT abdomen pelvis, 03/28/2021 FINDINGS: Lower chest: Small bilateral pleural effusions. Hepatobiliary: Somewhat coarse, nodular contour of the liver. Examination of the liver parenchyma is significantly limited by extensive breath motion and field inhomogeneity artifact. Within this limitation, no mass or suspicious contrast enhancement identified. Subcapsular lesions adjacent to the gallbladder fossa identified by prior CT are not clearly appreciated; the contour of the subcapsular lesion laterally is visible, however there is no intrinsic signal abnormality or differential contrast enhancement to distinguish this from background liver parenchyma (series 13, image 61). Gallbladder wall thickening and pericholecystic fluid, nonspecific in the setting of ascites. No gallstones or biliary ductal dilatation. Pancreas: No mass, inflammatory changes, or other parenchymal abnormality identified. No biliary ductal dilatation. Spleen:   Splenomegaly, maximum coronal span 16.3 cm. Adrenals/Urinary Tract: No masses identified. No evidence of hydronephrosis. Stomach/Bowel: Visualized portions within the abdomen are unremarkable. Vascular/Lymphatic: No pathologically enlarged lymph nodes identified. No abdominal aortic aneurysm demonstrated. Recanalization of the umbilical vein with abdominal wall varices. Other:  Small volume ascites throughout the abdomen.  Anasarca. Musculoskeletal: No suspicious bone lesions identified. IMPRESSION: 1. Examination of the liver parenchyma is significantly limited by extensive breath motion and field inhomogeneity artifact secondary to ascites. Within this limitation, no mass or suspicious contrast enhancement is clearly identified. 2. Subcapsular lesions adjacent to the gallbladder fossa identified by prior CT are not clearly appreciated by MR. The contour of the subcapsular lesion laterally is seen, however there is no intrinsic signal abnormality or differential contrast enhancement to distinguish this from background liver parenchyma. These findings are of uncertain significance, particularly within the limitations of this MR examination. Consider follow-up multiphasic contrast enhanced liver protocol CT, generally more robust to breath motion artifact,at 6 months to ensure stability. 3. Gallbladder wall thickening and pericholecystic fluid, nonspecific in the setting of ascites. No gallstones or biliary ductal dilatation. 4. Cirrhosis, splenomegaly, and umbilical varices. 5. Pleural effusions, ascites, and anasarca. Electronically Signed   By: Jearld Lesch M.D.   On: 03/31/2021 10:13   US Paracentesis  Result Date: 03/31/2021 INDICATION: Jaundice, ascites, cirrhotic appearing liver by CT question alcoholic cirrhosis EXAM: ULTRASOUND GUIDED DIAGNOSTIC AND THERAPEUTIC PARACENTESIS MEDICATIONS: None. COMPLICATIONS: None immediate. PROCEDURE: Informed written consent was obtained from the patient after a  discussion of the risks, benefits and alternatives to treatment. A timeout was performed prior to the initiation of the procedure. Initial ultrasound scanning demonstrates a moderate amount of ascites within the right lower abdominal quadrant. The right lower abdomen was prepped and draped in the usual sterile fashion. 1% lidocaine was used for local anesthesia. Following this, a 5 [place] Yueh catheter was introduced. An ultrasound image was saved for documentation purposes. The paracentesis was performed. The catheter was removed and a dressing was applied. The patient tolerated the procedure well without immediate post procedural complication. FINDINGS: A total of approximately 1.5 L of slightly cloudy bright yellow ascitic fluid was removed. Samples were sent to the laboratory as requested by the clinical team. IMPRESSION: Successful ultrasound-guided paracentesis yielding 1.5  liters of peritoneal fluid. Electronically Signed   By: Ulyses Southward M.D.   On: 03/31/2021 11:00   US LIVER DOPPLER  Result Date: 03/31/2021 CLINICAL DATA:  Alcoholic cirrhosis EXAM: DUPLEX ULTRASOUND OF LIVER TECHNIQUE: Color and duplex Doppler ultrasound was performed to evaluate the hepatic in-flow and out-flow vessels. Technologist describes technically difficult study secondary to body habitus and overlying bowel gas. COMPARISON:  CT 03/28/2021 FINDINGS: Liver: Heterogenous echotexture.  Nodular contour. The 2 right lobe lesions suspected on recent CT are less conspicuous on this exam. Main Portal Vein size: 1.4 cm Portal Vein Velocities (all hepatopetal): Main Prox:  29 cm/sec Main Mid: 33 cm/sec Main Dist:  37 cm/sec Right: 30 cm/sec Left: 23 cm/sec Hepatic Vein Velocities (all hepatofugal): Right:  48 cm/sec Middle:  36 cm/sec Left:  35 cm/sec IVC: Present and patent with normal respiratory phasicity., 2.7 cm diameter Hepatic Artery Velocity:  129 cm/sec Splenic Vein Velocity:  41 cm/sec Spleen: 10.7 cm x 7.9 cm x 13.9 cm with a  total volume of 610 cm^3 (411 cm^3 is upper limit normal) Portal Vein Occlusion/Thrombus: No Splenic Vein Occlusion/Thrombus: No Ascites: Small volume Varices: None identified IMPRESSION: 1. Unremarkable hepatic vascular Doppler evaluation 2. Nodular liver contour suggesting cirrhosis. 3. Small volume abdominal ascites Electronically Signed   By: Corlis Leak M.D.   On: 03/31/2021 07:40    Scheduled Meds:  sodium chloride   Intravenous Once   folic acid  1 mg Oral Daily   lactulose  10 g Oral BID   multivitamin with minerals  1 tablet Oral Daily   nicotine  21 mg Transdermal Daily   pantoprazole  40 mg Oral BID   thiamine  100 mg Oral Daily   Continuous Infusions:  albumin human 50 g (03/31/21 1342)   cefTRIAXone (ROCEPHIN)  IV 2 g (03/30/21 1844)     LOS: 3 days   Time spent: 34 mins   Delainie Chavana Laural Benes, MD How to contact the [facility] Attending or Consulting provider 7A - 7P or covering provider during after hours 7P -7A, for this patient?  Check the care team in [facility] and look for a) attending/consulting [facility] provider listed and b) the [facility] team listed Log into [email or web address] and use [place]'s universal password to access. If you do not have the password, please contact the hospital operator. Locate the [facility] provider you are looking for under [facility] and page to a number that you can be directly reached. If you still have difficulty reaching the provider, please page the [facility] (Director on Call) for the Hospitalists listed on amion for assistance.  03/31/2021, 2:05 PM

## 2021-03-31 NOTE — Procedures (Signed)
PreOperative Dx: Cirrhosis, ascites Postoperative Dx: Cirrhosis, ascites Procedure:   US guided paracentesis Radiologist:  Tyron Russell Anesthesia:  10 ml of1% lidocaine Specimen:  1.5 L of  slightly cloudy bright yellow ascitic fluid EBL:   < 1 ml Complications: None

## 2021-03-31 NOTE — Progress Notes (Signed)
PT tolerated right sided paracentesis procedure well today and 1.5 Liters of clear yellow fluid removed with labs collected and sent for processing. PT returned to inpatient bed assignment via wheelchair at this time with NAD. PT verbalized understanding of post procedure instructions.

## 2021-04-01 ENCOUNTER — Inpatient Hospital Stay (HOSPITAL_COMMUNITY): Payer: Self-pay | Admitting: Anesthesiology

## 2021-04-01 ENCOUNTER — Encounter (HOSPITAL_COMMUNITY): Payer: Self-pay | Admitting: Internal Medicine

## 2021-04-01 ENCOUNTER — Encounter (HOSPITAL_COMMUNITY)
Admission: EM | Disposition: A | Discharge status: Home / Self Care | Payer: Self-pay | Source: Home / Self Care | Attending: Family Medicine

## 2021-04-01 DIAGNOSIS — D509 Iron deficiency anemia, unspecified: Secondary | ICD-10-CM

## 2021-04-01 DIAGNOSIS — I8510 Secondary esophageal varices without bleeding: Secondary | ICD-10-CM

## 2021-04-01 DIAGNOSIS — K3189 Other diseases of stomach and duodenum: Secondary | ICD-10-CM

## 2021-04-01 DIAGNOSIS — K769 Liver disease, unspecified: Secondary | ICD-10-CM

## 2021-04-01 DIAGNOSIS — Z13810 Encounter for screening for upper gastrointestinal disorder: Secondary | ICD-10-CM

## 2021-04-01 DIAGNOSIS — K652 Spontaneous bacterial peritonitis: Secondary | ICD-10-CM

## 2021-04-01 HISTORY — PX: ESOPHAGEAL BANDING: SHX5518

## 2021-04-01 HISTORY — PX: ESOPHAGOGASTRODUODENOSCOPY (EGD) WITH PROPOFOL: SHX5813

## 2021-04-01 LAB — COMPREHENSIVE METABOLIC PANEL
ALT: 31 U/L (ref 0–44)
AST: 67 U/L — ABNORMAL HIGH (ref 15–41)
Albumin: 4.1 g/dL (ref 3.5–5.0)
Alkaline Phosphatase: 63 U/L (ref 38–126)
Anion gap: 8 (ref 5–15)
BUN: 5 mg/dL — ABNORMAL LOW (ref 6–20)
CO2: 20 mmol/L — ABNORMAL LOW (ref 22–32)
Calcium: 8.5 mg/dL — ABNORMAL LOW (ref 8.9–10.3)
Chloride: 108 mmol/L (ref 98–111)
Creatinine, Ser: 0.72 mg/dL (ref 0.61–1.24)
GFR, Estimated: >60 mL/min (ref >60)
Glucose, Bld: 109 mg/dL — ABNORMAL HIGH (ref 70–99)
Potassium: 3.8 mmol/L (ref 3.5–5.1)
Sodium: 136 mmol/L (ref 135–145)
Total Bilirubin: 5.6 mg/dL — ABNORMAL HIGH (ref 0.3–1.2)
Total Protein: 7.1 g/dL (ref 6.5–8.1)

## 2021-04-01 LAB — CYTOLOGY - NON PAP

## 2021-04-01 LAB — CBC
HCT: 25.5 % — ABNORMAL LOW (ref 39.0–52.0)
Hemoglobin: 7.7 g/dL — ABNORMAL LOW (ref 13.0–17.0)
MCH: 31.6 pg (ref 26.0–34.0)
MCHC: 30.2 g/dL (ref 30.0–36.0)
MCV: 104.5 fL — ABNORMAL HIGH (ref 80.0–100.0)
Platelets: 111 10*3/uL — ABNORMAL LOW (ref 150–400)
RBC: 2.44 MIL/uL — ABNORMAL LOW (ref 4.22–5.81)
RDW: 20 % — ABNORMAL HIGH (ref 11.5–15.5)
WBC: 8.2 10*3/uL (ref 4.0–10.5)
nRBC: 0 % (ref 0.0–0.2)

## 2021-04-01 LAB — MAGNESIUM: Magnesium: 2 mg/dL (ref 1.7–2.4)

## 2021-04-01 LAB — PROTIME-INR
INR: 1.7 — ABNORMAL HIGH (ref 0.8–1.2)
Prothrombin Time: 20 seconds — ABNORMAL HIGH (ref 11.4–15.2)

## 2021-04-01 LAB — GLUCOSE, CAPILLARY: Glucose-Capillary: 97 mg/dL (ref 70–99)

## 2021-04-01 LAB — AMMONIA: Ammonia: 44 umol/L — ABNORMAL HIGH (ref 9–35)

## 2021-04-01 SURGERY — ESOPHAGOGASTRODUODENOSCOPY (EGD) WITH PROPOFOL
Anesthesia: General

## 2021-04-01 MED ORDER — ALBUMIN HUMAN 25 % IV SOLN
INTRAVENOUS | Status: AC
  Filled 2021-04-01: qty 50

## 2021-04-01 MED ORDER — LACTATED RINGERS IV SOLN: INTRAVENOUS | Status: DC | PRN

## 2021-04-01 MED ORDER — FUROSEMIDE 20 MG PO TABS
20.0000 mg | ORAL_TABLET | Freq: Every day | ORAL | Status: DC
  Administered 2021-04-02 – 2021-04-03 (×2): 20 mg via ORAL
  Filled 2021-04-01 (×2): qty 1

## 2021-04-01 MED ORDER — SPIRONOLACTONE 25 MG PO TABS
50.0000 mg | ORAL_TABLET | Freq: Every day | ORAL | Status: DC
  Administered 2021-04-02 – 2021-04-03 (×2): 50 mg via ORAL
  Filled 2021-04-01 (×2): qty 2

## 2021-04-01 MED ORDER — LIDOCAINE HCL (CARDIAC) PF 100 MG/5ML IV SOSY
PREFILLED_SYRINGE | INTRAVENOUS | Status: DC | PRN
  Administered 2021-04-01: 50 mg via INTRAVENOUS

## 2021-04-01 MED ORDER — PROPOFOL 10 MG/ML IV BOLUS
INTRAVENOUS | Status: DC | PRN
  Administered 2021-04-01: 30 mg via INTRAVENOUS
  Administered 2021-04-01 (×2): 20 mg via INTRAVENOUS
  Administered 2021-04-01: 130 mg via INTRAVENOUS

## 2021-04-01 MED ORDER — PROPOFOL 500 MG/50ML IV EMUL
INTRAVENOUS | Status: DC | PRN
  Administered 2021-04-01: 150 ug/kg/min via INTRAVENOUS

## 2021-04-01 MED ORDER — SODIUM CHLORIDE 0.9 % IV SOLN: INTRAVENOUS | Status: DC

## 2021-04-01 MED ORDER — PHYTONADIONE 5 MG PO TABS
ORAL_TABLET | ORAL | Status: AC
  Filled 2021-04-01: qty 2

## 2021-04-01 MED ORDER — PHYTONADIONE 5 MG PO TABS
10.0000 mg | ORAL_TABLET | Freq: Once | ORAL | Status: AC
  Administered 2021-04-01: 10 mg via ORAL

## 2021-04-01 NOTE — Plan of Care (Signed)

## 2021-04-01 NOTE — Anesthesia Preprocedure Evaluation (Addendum)
Anesthesia Evaluation  Patient identified by MRN, date of birth, ID band Patient awake    Reviewed: Allergy & Precautions, H&P , NPO status , Patient's Chart, lab work & pertinent test results, reviewed documented beta blocker date and time   Airway Mallampati: II  TM Distance: >3 FB Neck ROM: full    Dental no notable dental hx.    Pulmonary neg pulmonary ROS, Current Smoker,    Pulmonary exam normal breath sounds clear to auscultation       Cardiovascular Exercise Tolerance: Good hypertension, negative cardio ROS   Rhythm:regular Rate:Normal     Neuro/Psych PSYCHIATRIC DISORDERS negative neurological ROS     GI/Hepatic negative GI ROS, (+)     substance abuse  alcohol use, Hepatitis -, Toxin Related  Endo/Other  negative endocrine ROS  Renal/GU negative Renal ROS  negative genitourinary   Musculoskeletal   Abdominal   Peds  Hematology  (+) Blood dyscrasia, anemia ,   Anesthesia Other Findings   Reproductive/Obstetrics negative OB ROS                             Anesthesia Physical Anesthesia Plan  ASA: 3 and emergent  Anesthesia Plan: General   Post-op Pain Management:    Induction:   PONV Risk Score and Plan: Propofol infusion  Airway Management Planned:   Additional Equipment:   Intra-op Plan:   Post-operative Plan:   Informed Consent: I have reviewed the patients History and Physical, chart, labs and discussed the procedure including the risks, benefits and alternatives for the proposed anesthesia with the patient or authorized representative who has indicated his/her understanding and acceptance.     Dental Advisory Given  Plan Discussed with: CRNA  Anesthesia Plan Comments:        Anesthesia Quick Evaluation

## 2021-04-01 NOTE — Progress Notes (Signed)
PROGRESS NOTE   Craig Andrews  ZOX:[id number] DOB: 03/14/1981 DOA: 03/28/2021 PCP: Elenora Gamma, MD (Inactive)   Chief Complaint  Patient presents with   Jaundice   Level of care: Telemetry  Brief Admission History:  40 y.o. male, with past medical history of longstanding alcohol use, he reports last drink was 02/15/2021, he presents to ED secondary to complaints of tremors, jaundice, abdominal pain, reports symptoms of fatigue started on October, he was told in the past by his PCP he did develop cirrhosis, he continued to drink despite that, he reports swelling in lower extremities, and abdomen over last few weeks, and gradual yellowing of his skin, reports he quit drinking, but symptoms are gradually worsening, he denies any coffee-ground emesis, and any melena or bright red blood per rectum, he reports his stools used to be black in color, but only has been brown, he denies any chest pain or shortness of breath, he denies any illicit drug use besides occasional marijuana.  In ED patient work-up significant for potassium 2.3, sodium 131, phosphorus 1.8, lipase 131, total bilirubin of 7.2, hemoglobin 6.6, with MCV of 109, but Hemoccult negative, platelet count at 140 2K, CT abdomen pelvis significant for ascites, liver cirrhosis and 2 liver lesions, [facility] hospitalist consulted to admit.  Assessment & Plan:   Principal Problem:   Alcoholic hepatitis Active Problems:   Macrocytic anemia   Hypokalemia   Alcoholic cirrhosis of liver with ascites (HCC)   Anemia   Ascites   Pleural effusion   SOB (shortness of breath)   SBP (spontaneous bacterial peritonitis) (HCC)   Lesion of liver  Decompensated liver cirrhosis  - appreciate GI consultation and recommendations - MRI liver indeterminate  - Follow ammonia on lactulose - lactulose ordered for goal of 2-3 BMs per day - GI team to start daily diuretics 11/29  Chronic alcohol abuse  - Pt reports he has abstained from alcohol since  mid Oct 2022.  - Pt says he has no plans to restart alcohol consumption as he does want a liver transplant  IDA  - Hg down to 7.7 post transfusion  - continue iron supplementation - GI planning inpatient EGD to evaluate varices.  - transfused total of 3 unit PRBC since admission - GI planning inpatient EGD 11/29 and outpatient colon survey    Hyperammonemia - lactulose started with goal of 2-3 BMs per day - Ammonia is trending down  - Pt not having symptoms of hepatic encephalopathy at this time  Thrombocytopenia  - secondary to chronic liver disease - no active bleeding found   Mild pulmonary edema - TREATED - I gave him lasix 40 mg po on 11/28 and he diuresed 2100 cc urine   Hypokalemia - gave additional magnesium 11/28 - gave additional oral potassium  - repleted   Liver lesions - MRI liver on 11/28 was indeterminate - AFP was 2.7 (WNL) and diagnostic paracentesis studies pending from 11/28  - MRI inconclusive as it was a poor study, recommended outpatient multiphasic contrast enhanced liver protocol CT - GI team will arrange outpatient study on ambulatory follow up.  SBP - prelim fluid testing from paracentesis G pos cocci  - pt has remained on IV ceftriaxone since admission - continue IV antibiotics and follow cultures - steroids being held due to SBP treatment   Pleural effusions - Pt had symptoms of SOB when lying flat for MRI and paracentesis - discussed with Dyanne Iha in radiology who asked about thoracentesis; repeat CXR ordered -  follow up CXR did not show evidence of any significant pleural effusion  DVT prophylaxis: SCDs Code Status: Full  Family Communication: patient, he said he preferred to update parents himself and I respected his wishes  Disposition: anticipating DC home in 1-2 days  Status is: Inpatient Remains inpatient appropriate because: IV therapies, inpatient EGD required    Consultants:  GI   Procedures:  MRI Liver  11/28 Paracentesis 11/28   Antimicrobials:  Ceftriaxone IV    Subjective: Pt says he is now having 2-3 bowel movements daily after having paracentesis and feeling better overall.    Objective: Vitals:   03/31/21 1403 03/31/21 1406 03/31/21 2202 04/01/21 0547  BP:  118/82 126/88 128/81  Pulse: 100 (!) 101 (!) 102 98  Resp:  20 18 18   Temp:  98.9 F (37.2 C) 98.8 F (37.1 C) (!) 100.7 F (38.2 C)  TempSrc:  Oral  Oral  SpO2: 96% 96% 94% 95%  Weight:      Height:        Intake/Output Summary (Last 24 hours) at 04/01/2021 1208 Last data filed at 04/01/2021 04/03/2021 Gross per 24 hour  Intake 1558.78 ml  Output 1750 ml  Net -191.22 ml   Filed Weights   03/28/21 1258  Weight: 92.8 kg    Examination:  General exam: jaundiced skin unchanged, Appears calm and comfortable, NAD.  Respiratory system: shallow but clear sounds bilateral.  Cardiovascular system: normal S1 & S2 heard. No JVD, murmurs, rubs, gallops or clicks. No pedal edema. Gastrointestinal system: Abdomen is less distended, soft and nontender.  hepatomegaly or masses felt. Normal bowel sounds heard. Central nervous system: Alert and oriented x3. No focal neurological deficits. Extremities: Symmetric 5 x 5 power. Skin: diffuse jaundice and bilateral scleral icterus.  Psychiatry: Judgement and insight UTD. Mood & affect appropriate.   Data Reviewed: I have personally reviewed following labs and imaging studies  CBC: Recent Labs  Lab 03/28/21 1534 03/29/21 0355 03/30/21 0250 03/31/21 0351 04/01/21 0542  WBC 11.6* 9.3 8.0 7.5 8.2  NEUTROABS 9.4*  --   --   --   --   HGB 6.6* 7.5* 7.0* 7.8* 7.7*  HCT 21.7* 23.7* 22.7* 26.1* 25.5*  MCV 109.0* 100.0 104.1* 104.8* 104.5*  PLT 142* 118* 112* 108* 111*    Basic Metabolic Panel: Recent Labs  Lab 03/28/21 1534 03/29/21 0355 03/29/21 0817 03/30/21 0250 03/31/21 0351 04/01/21 0542  NA 131*  --  135 135 138 136  K 2.3*  --  3.2* 3.1* 3.3* 3.8  CL 92*  --   99 102 108 108  CO2 30  --  26 26 23  20*  GLUCOSE 107*  --  105* 97 95 109*  BUN 8  --  5* <5* <5* 5*  CREATININE 0.84  --  0.66 0.67 0.58* 0.72  CALCIUM 7.3*  --  7.2* 7.9* 8.3* 8.5*  MG 1.9 1.8  --  1.8 1.7 2.0  PHOS 1.8* 3.0  --   --   --   --     GFR: Estimated Creatinine Clearance: 144.3 mL/min (by C-G formula based on SCr of 0.72 mg/dL).  Liver Function Tests: Recent Labs  Lab 03/28/21 1534 03/29/21 0355 03/30/21 0250 03/31/21 0351 04/01/21 0542  AST 101* 86* 84* 67* 67*  ALT 46* 40 37 30 31  ALKPHOS 110 93 85 67 63  BILITOT 7.2* 6.6* 5.1* 7.5* 5.6*  PROT 6.3* 5.7* 6.1* 6.3* 7.1  ALBUMIN 2.0* 1.7* 2.7* 3.4* 4.1  CBG: No results for input(s): GLUCAP in the last 168 hours.  Recent Results (from the past 240 hour(s))  Resp Panel by RT-PCR (Flu A&B, Covid) Nasopharyngeal Swab     Status: None   Collection Time: 03/28/21  5:09 PM   Specimen: Nasopharyngeal Swab; Nasopharyngeal(NP) swabs in vial transport medium  Result Value Ref Range Status   SARS Coronavirus 2 by RT PCR NEGATIVE NEGATIVE Final    Comment: (NOTE) SARS-CoV-2 target nucleic acids are NOT DETECTED.  The SARS-CoV-2 RNA is generally detectable in upper respiratory specimens during the acute phase of infection. The lowest concentration of SARS-CoV-2 viral copies this assay can detect is 138 copies/mL. A negative result does not preclude SARS-Cov-2 infection and should not be used as the sole basis for treatment or other patient management decisions. A negative result may occur with  improper specimen collection/handling, submission of specimen other than nasopharyngeal swab, presence of viral mutation(s) within the areas targeted by this assay, and inadequate number of viral copies(<138 copies/mL). A negative result must be combined with clinical observations, patient history, and epidemiological information. The expected result is Negative.  Fact Sheet for Patients:   BloggerCourse.com  Fact Sheet for Healthcare Providers:  SeriousBroker.it  This test is no t yet approved or cleared by the [place] FDA and  has been authorized for detection and/or diagnosis of SARS-CoV-2 by FDA under an Emergency Use Authorization (EUA). This EUA will remain  in effect (meaning this test can be used) for the duration of the COVID-19 declaration under Section 564(b)(1) of the Act, 21 U.S.C.section 360bbb-3(b)(1), unless the authorization is terminated  or revoked sooner.       Influenza A by PCR NEGATIVE NEGATIVE Final   Influenza B by PCR NEGATIVE NEGATIVE Final    Comment: (NOTE) The Xpert Xpress SARS-CoV-2/FLU/RSV plus assay is intended as an aid in the diagnosis of influenza from Nasopharyngeal swab specimens and should not be used as a sole basis for treatment. Nasal washings and aspirates are unacceptable for Xpert Xpress SARS-CoV-2/FLU/RSV testing.  Fact Sheet for Patients: BloggerCourse.com  Fact Sheet for Healthcare Providers: SeriousBroker.it  This test is not yet approved or cleared by the [place] FDA and has been authorized for detection and/or diagnosis of SARS-CoV-2 by FDA under an Emergency Use Authorization (EUA). This EUA will remain in effect (meaning this test can be used) for the duration of the COVID-19 declaration under Section 564(b)(1) of the Act, 21 U.S.C. section 360bbb-3(b)(1), unless the authorization is terminated or revoked.  Performed at [facility], [address] [place], [place] [address]   MRSA Next Gen by PCR, Nasal     Status: None   Collection Time: 03/28/21  7:31 PM   Specimen: Nasal Mucosa; Nasal Swab  Result Value Ref Range Status   MRSA by PCR Next Gen NOT DETECTED NOT DETECTED Final    Comment: (NOTE) The GeneXpert MRSA Assay (FDA approved for NASAL specimens only), is one component of a  comprehensive MRSA colonization surveillance program. It is not intended to diagnose MRSA infection nor to guide or monitor treatment for MRSA infections. Test performance is not FDA approved in patients less than 52 years old. Performed at [facility], [address], [place], [place] [address]   Gram stain     Status: None   Collection Time: 03/31/21 10:25 AM   Specimen: Peritoneal Washings  Result Value Ref Range Status   Specimen Description PERITONEAL  Final   Special Requests NONE  Final  Gram Stain   Final    GRAM POSITIVE COCCI WBC PRESENT, PREDOMINANTLY MONONUCLEAR CYTOSPIN SMEAR Gram Stain Report Called to,Read Back By and Verified With: Vivi Martens RN [phone] K FORSYTH Performed at [facility], [address], [place], [place] [address]    Report Status 03/31/2021 FINAL  Final  Culture, body fluid w Gram Stain-bottle     Status: None (Preliminary result)   Collection Time: 03/31/21 10:25 AM   Specimen: Peritoneal Washings  Result Value Ref Range Status   Specimen Description PERITONEAL ASCITES  Final   Special Requests   Final    BOTTLES DRAWN AEROBIC AND ANAEROBIC Blood Culture results may not be optimal due to an excessive volume of blood received in culture bottles   Culture   Final    NO GROWTH < 24 HOURS Performed at [facility], [address], [place], [place] [address]    Report Status PENDING  Incomplete     Radiology Studies: MR ABDOMEN W WO CONTRAST  Result Date: 03/31/2021 CLINICAL DATA:  Characterize possible liver lesions identified by prior CT EXAM: MRI ABDOMEN WITHOUT AND WITH CONTRAST TECHNIQUE: Multiplanar multisequence MR imaging of the abdomen was performed both before and after the administration of intravenous contrast. CONTRAST:  46mL GADAVIST GADOBUTROL 1 MMOL/ML IV SOLN COMPARISON:  CT abdomen pelvis, 03/28/2021 FINDINGS: Lower chest: Small bilateral pleural effusions. Hepatobiliary: Somewhat coarse, nodular contour of the liver. Examination  of the liver parenchyma is significantly limited by extensive breath motion and field inhomogeneity artifact. Within this limitation, no mass or suspicious contrast enhancement identified. Subcapsular lesions adjacent to the gallbladder fossa identified by prior CT are not clearly appreciated; the contour of the subcapsular lesion laterally is visible, however there is no intrinsic signal abnormality or differential contrast enhancement to distinguish this from background liver parenchyma (series 13, image 61). Gallbladder wall thickening and pericholecystic fluid, nonspecific in the setting of ascites. No gallstones or biliary ductal dilatation. Pancreas: No mass, inflammatory changes, or other parenchymal abnormality identified. No biliary ductal dilatation. Spleen:  Splenomegaly, maximum coronal span 16.3 cm. Adrenals/Urinary Tract: No masses identified. No evidence of hydronephrosis. Stomach/Bowel: Visualized portions within the abdomen are unremarkable. Vascular/Lymphatic: No pathologically enlarged lymph nodes identified. No abdominal aortic aneurysm demonstrated. Recanalization of the umbilical vein with abdominal wall varices. Other:  Small volume ascites throughout the abdomen.  Anasarca. Musculoskeletal: No suspicious bone lesions identified. IMPRESSION: 1. Examination of the liver parenchyma is significantly limited by extensive breath motion and field inhomogeneity artifact secondary to ascites. Within this limitation, no mass or suspicious contrast enhancement is clearly identified. 2. Subcapsular lesions adjacent to the gallbladder fossa identified by prior CT are not clearly appreciated by MR. The contour of the subcapsular lesion laterally is seen, however there is no intrinsic signal abnormality or differential contrast enhancement to distinguish this from background liver parenchyma. These findings are of uncertain significance, particularly within the limitations of this MR examination. Consider  follow-up multiphasic contrast enhanced liver protocol CT, generally more robust to breath motion artifact,at 6 months to ensure stability. 3. Gallbladder wall thickening and pericholecystic fluid, nonspecific in the setting of ascites. No gallstones or biliary ductal dilatation. 4. Cirrhosis, splenomegaly, and umbilical varices. 5. Pleural effusions, ascites, and anasarca. Electronically Signed   By: Jearld Lesch M.D.   On: 03/31/2021 10:13   US Paracentesis  Result Date: 03/31/2021 INDICATION: Jaundice, ascites, cirrhotic appearing liver by CT question alcoholic cirrhosis EXAM: ULTRASOUND GUIDED DIAGNOSTIC AND THERAPEUTIC PARACENTESIS MEDICATIONS: None. COMPLICATIONS: None immediate. PROCEDURE:  Informed written consent was obtained from the patient after a discussion of the risks, benefits and alternatives to treatment. A timeout was performed prior to the initiation of the procedure. Initial ultrasound scanning demonstrates a moderate amount of ascites within the right lower abdominal quadrant. The right lower abdomen was prepped and draped in the usual sterile fashion. 1% lidocaine was used for local anesthesia. Following this, a 5 [place] Yueh catheter was introduced. An ultrasound image was saved for documentation purposes. The paracentesis was performed. The catheter was removed and a dressing was applied. The patient tolerated the procedure well without immediate post procedural complication. FINDINGS: A total of approximately 1.5 L of slightly cloudy bright yellow ascitic fluid was removed. Samples were sent to the laboratory as requested by the clinical team. IMPRESSION: Successful ultrasound-guided paracentesis yielding 1.5 liters of peritoneal fluid. Electronically Signed   By: Ulyses Southward M.D.   On: 03/31/2021 11:00   DG Chest Port 1 View  Result Date: 03/31/2021 CLINICAL DATA:  Shortness of breath, pleural effusions, cirrhosis, ascites EXAM: PORTABLE CHEST 1 VIEW COMPARISON:  Portable exam  1317 hours compared to 08/27/2020 FINDINGS: Upper normal heart size with slight vascular congestion. Mediastinal contours normal. Slight accentuation of interstitial markings, question minimal edema. No gross pleural effusions identified on portable exam. No pneumothorax. Osseous structures unremarkable. IMPRESSION: Slight pulmonary vascular congestion and accentuation of interstitial markings, question minimal pulmonary edema. Electronically Signed   By: Ulyses Southward M.D.   On: 03/31/2021 14:11   US LIVER DOPPLER  Result Date: 03/31/2021 CLINICAL DATA:  Alcoholic cirrhosis EXAM: DUPLEX ULTRASOUND OF LIVER TECHNIQUE: Color and duplex Doppler ultrasound was performed to evaluate the hepatic in-flow and out-flow vessels. Technologist describes technically difficult study secondary to body habitus and overlying bowel gas. COMPARISON:  CT 03/28/2021 FINDINGS: Liver: Heterogenous echotexture.  Nodular contour. The 2 right lobe lesions suspected on recent CT are less conspicuous on this exam. Main Portal Vein size: 1.4 cm Portal Vein Velocities (all hepatopetal): Main Prox:  29 cm/sec Main Mid: 33 cm/sec Main Dist:  37 cm/sec Right: 30 cm/sec Left: 23 cm/sec Hepatic Vein Velocities (all hepatofugal): Right:  48 cm/sec Middle:  36 cm/sec Left:  35 cm/sec IVC: Present and patent with normal respiratory phasicity., 2.7 cm diameter Hepatic Artery Velocity:  129 cm/sec Splenic Vein Velocity:  41 cm/sec Spleen: 10.7 cm x 7.9 cm x 13.9 cm with a total volume of 610 cm^3 (411 cm^3 is upper limit normal) Portal Vein Occlusion/Thrombus: No Splenic Vein Occlusion/Thrombus: No Ascites: Small volume Varices: None identified IMPRESSION: 1. Unremarkable hepatic vascular Doppler evaluation 2. Nodular liver contour suggesting cirrhosis. 3. Small volume abdominal ascites Electronically Signed   By: Corlis Leak M.D.   On: 03/31/2021 07:40    Scheduled Meds:  sodium chloride   Intravenous Once   folic acid  1 mg Oral Daily   [START  ON 04/02/2021] furosemide  20 mg Oral Daily   lactulose  10 g Oral BID   multivitamin with minerals  1 tablet Oral Daily   nicotine  21 mg Transdermal Daily   pantoprazole  40 mg Oral BID   [START ON 04/02/2021] spironolactone  50 mg Oral Daily   thiamine  100 mg Oral Daily   Continuous Infusions:  cefTRIAXone (ROCEPHIN)  IV Stopped (03/31/21 1912)    LOS: 4 days   Time spent: 35 mins   Sheily Lineman Laural Benes, MD How to contact the [facility] Attending or Consulting provider 7A - 7P or covering provider during after  hours 7P -7A, for this patient?  Check the care team in [facility] and look for a) attending/consulting [facility] provider listed and b) the [facility] team listed Log into [email or web address] and use [place]'s universal password to access. If you do not have the password, please contact the hospital operator. Locate the [facility] provider you are looking for under [facility] and page to a number that you can be directly reached. If you still have difficulty reaching the provider, please page the [facility] (Director on Call) for the Hospitalists listed on amion for assistance.  04/01/2021, 12:08 PM

## 2021-04-01 NOTE — Progress Notes (Signed)
Subjective: Feeling well this morning. No confusion, abdominal pain, nausea, or vomiting. Had 3 Bms yesterday. No brbpr or melena. Abdomen is much less distended since having paracentesis.  States it is about back to normal.  No peripheral edema. Plans to continue with alcohol cessation.   Objective: Vital signs in last 24 hours: Temp:  [98.3 F (36.8 C)-100.7 F (38.2 C)] 100.7 F (38.2 C) (11/29 0547) Pulse Rate:  [98-110] 98 (11/29 0547) Resp:  [18-20] 18 (11/29 0547) BP: (118-128)/(81-88) 128/81 (11/29 0547) SpO2:  [91 %-96 %] 95 % (11/29 0547) Last BM Date: 03/31/21 General:   Alert and oriented, pleasant, NAD, jaundiced. Head:  Normocephalic and atraumatic. Eyes:  + Scleral icterus Abdomen:  Bowel sounds present.  Protuberant abdomen, but soft and nontender to palpation. No HSM or hernias noted. No rebound or guarding. No masses appreciated  Msk:  Symmetrical without gross deformities. Normal posture. Extremities:  Without edema. Neurologic:  Alert and  oriented x4;  grossly normal neurologically.  No asterixis. Skin:  Warm and dry, intact without significant lesions.  Psych:  Normal mood and affect.  Intake/Output from previous day: 11/28 0701 - 11/29 0700 In: 2518.8 [P.O.:1560; IV Piggyback:958.8] Out: 2100 [Urine:2100] Intake/Output this shift: No intake/output data recorded.  Lab Results: Recent Labs    03/30/21 0250 03/31/21 0351 04/01/21 0542  WBC 8.0 7.5 8.2  HGB 7.0* 7.8* 7.7*  HCT 22.7* 26.1* 25.5*  PLT 112* 108* 111*   BMET Recent Labs    03/30/21 0250 03/31/21 0351 04/01/21 0542  NA 135 138 136  K 3.1* 3.3* 3.8  CL 102 108 108  CO2 26 23 20*  GLUCOSE 97 95 109*  BUN <5* <5* 5*  CREATININE 0.67 0.58* 0.72  CALCIUM 7.9* 8.3* 8.5*   LFT Recent Labs    03/30/21 0250 03/31/21 0351 04/01/21 0542  PROT 6.1* 6.3* 7.1  ALBUMIN 2.7* 3.4* 4.1  AST 84* 67* 67*  ALT 37 30 31  ALKPHOS 85 67 63  BILITOT 5.1* 7.5* 5.6*   PT/INR Recent Labs     03/31/21 0351 04/01/21 0542  LABPROT 20.4* 20.0*  INR 1.7* 1.7*    Studies/Results: MR ABDOMEN W WO CONTRAST  Result Date: 03/31/2021 CLINICAL DATA:  Characterize possible liver lesions identified by prior CT EXAM: MRI ABDOMEN WITHOUT AND WITH CONTRAST TECHNIQUE: Multiplanar multisequence MR imaging of the abdomen was performed both before and after the administration of intravenous contrast. CONTRAST:  27mL GADAVIST GADOBUTROL 1 MMOL/ML IV SOLN COMPARISON:  CT abdomen pelvis, 03/28/2021 FINDINGS: Lower chest: Small bilateral pleural effusions. Hepatobiliary: Somewhat coarse, nodular contour of the liver. Examination of the liver parenchyma is significantly limited by extensive breath motion and field inhomogeneity artifact. Within this limitation, no mass or suspicious contrast enhancement identified. Subcapsular lesions adjacent to the gallbladder fossa identified by prior CT are not clearly appreciated; the contour of the subcapsular lesion laterally is visible, however there is no intrinsic signal abnormality or differential contrast enhancement to distinguish this from background liver parenchyma (series 13, image 61). Gallbladder wall thickening and pericholecystic fluid, nonspecific in the setting of ascites. No gallstones or biliary ductal dilatation. Pancreas: No mass, inflammatory changes, or other parenchymal abnormality identified. No biliary ductal dilatation. Spleen:  Splenomegaly, maximum coronal span 16.3 cm. Adrenals/Urinary Tract: No masses identified. No evidence of hydronephrosis. Stomach/Bowel: Visualized portions within the abdomen are unremarkable. Vascular/Lymphatic: No pathologically enlarged lymph nodes identified. No abdominal aortic aneurysm demonstrated. Recanalization of the umbilical vein with abdominal wall varices. Other:  Small volume ascites throughout the abdomen.  Anasarca. Musculoskeletal: No suspicious bone lesions identified. IMPRESSION: 1. Examination of the  liver parenchyma is significantly limited by extensive breath motion and field inhomogeneity artifact secondary to ascites. Within this limitation, no mass or suspicious contrast enhancement is clearly identified. 2. Subcapsular lesions adjacent to the gallbladder fossa identified by prior CT are not clearly appreciated by MR. The contour of the subcapsular lesion laterally is seen, however there is no intrinsic signal abnormality or differential contrast enhancement to distinguish this from background liver parenchyma. These findings are of uncertain significance, particularly within the limitations of this MR examination. Consider follow-up multiphasic contrast enhanced liver protocol CT, generally more robust to breath motion artifact,at 6 months to ensure stability. 3. Gallbladder wall thickening and pericholecystic fluid, nonspecific in the setting of ascites. No gallstones or biliary ductal dilatation. 4. Cirrhosis, splenomegaly, and umbilical varices. 5. Pleural effusions, ascites, and anasarca. Electronically Signed   By: Jearld Lesch M.D.   On: 03/31/2021 10:13   US Paracentesis  Result Date: 03/31/2021 INDICATION: Jaundice, ascites, cirrhotic appearing liver by CT question alcoholic cirrhosis EXAM: ULTRASOUND GUIDED DIAGNOSTIC AND THERAPEUTIC PARACENTESIS MEDICATIONS: None. COMPLICATIONS: None immediate. PROCEDURE: Informed written consent was obtained from the patient after a discussion of the risks, benefits and alternatives to treatment. A timeout was performed prior to the initiation of the procedure. Initial ultrasound scanning demonstrates a moderate amount of ascites within the right lower abdominal quadrant. The right lower abdomen was prepped and draped in the usual sterile fashion. 1% lidocaine was used for local anesthesia. Following this, a 5 [place] Yueh catheter was introduced. An ultrasound image was saved for documentation purposes. The paracentesis was performed. The catheter was  removed and a dressing was applied. The patient tolerated the procedure well without immediate post procedural complication. FINDINGS: A total of approximately 1.5 L of slightly cloudy bright yellow ascitic fluid was removed. Samples were sent to the laboratory as requested by the clinical team. IMPRESSION: Successful ultrasound-guided paracentesis yielding 1.5 liters of peritoneal fluid. Electronically Signed   By: Ulyses Southward M.D.   On: 03/31/2021 11:00   DG Chest Port 1 View  Result Date: 03/31/2021 CLINICAL DATA:  Shortness of breath, pleural effusions, cirrhosis, ascites EXAM: PORTABLE CHEST 1 VIEW COMPARISON:  Portable exam 1317 hours compared to 08/27/2020 FINDINGS: Upper normal heart size with slight vascular congestion. Mediastinal contours normal. Slight accentuation of interstitial markings, question minimal edema. No gross pleural effusions identified on portable exam. No pneumothorax. Osseous structures unremarkable. IMPRESSION: Slight pulmonary vascular congestion and accentuation of interstitial markings, question minimal pulmonary edema. Electronically Signed   By: Ulyses Southward M.D.   On: 03/31/2021 14:11   US LIVER DOPPLER  Result Date: 03/31/2021 CLINICAL DATA:  Alcoholic cirrhosis EXAM: DUPLEX ULTRASOUND OF LIVER TECHNIQUE: Color and duplex Doppler ultrasound was performed to evaluate the hepatic in-flow and out-flow vessels. Technologist describes technically difficult study secondary to body habitus and overlying bowel gas. COMPARISON:  CT 03/28/2021 FINDINGS: Liver: Heterogenous echotexture.  Nodular contour. The 2 right lobe lesions suspected on recent CT are less conspicuous on this exam. Main Portal Vein size: 1.4 cm Portal Vein Velocities (all hepatopetal): Main Prox:  29 cm/sec Main Mid: 33 cm/sec Main Dist:  37 cm/sec Right: 30 cm/sec Left: 23 cm/sec Hepatic Vein Velocities (all hepatofugal): Right:  48 cm/sec Middle:  36 cm/sec Left:  35 cm/sec IVC: Present and patent with  normal respiratory phasicity., 2.7 cm diameter Hepatic Artery Velocity:  129 cm/sec Splenic  Vein Velocity:  41 cm/sec Spleen: 10.7 cm x 7.9 cm x 13.9 cm with a total volume of 610 cm^3 (411 cm^3 is upper limit normal) Portal Vein Occlusion/Thrombus: No Splenic Vein Occlusion/Thrombus: No Ascites: Small volume Varices: None identified IMPRESSION: 1. Unremarkable hepatic vascular Doppler evaluation 2. Nodular liver contour suggesting cirrhosis. 3. Small volume abdominal ascites Electronically Signed   By: Corlis Leak M.D.   On: 03/31/2021 07:40    Assessment: 40 year old male with history of ETOH abuse and cirrhosis, presenting with decompensation in setting of worsening ascites, new onset jaundice, and alcoholic hepatitis. Additionally, he was found to have 2 right lobe liver masses on CT.   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 MELD Na 21 today. Acute hepatitis panel negative. Immune to Hep A. Needs Hep B vaccination. Additional serologies-IgG within normal limits, ASMA negative, ANA negative, ceruloplasmin 24.5.  Ultrasound liver doppler this admission without thrombus.  Mildly elevated ammonia this admission with mild asterixis without overt encephalopathy on lactulose twice daily.  Had 3 BMs yesterday, ammonia downtrending, and asterixis improved.  He doesn't have peripheral edema, but was found to have small volume ascites, now s/p paracentesis yesterday yielding 1.5 L, PMNs less than 250, but gram stain with gram-positive cocci.  Culture is pending.  Unclear if this may be a contaminant; however, he has been on antibiotics since admission which could skew his PMNs.  Alcoholic hepatitis: DF 37.8 today with PT average of 13; this is down from 41.5 yesterday.  Encouragingly, bilirubin is starting to trend down, peaked at 7.5 yesterday, down to 5.6 today.  INR increasing this admission from 1.4-1.7 today, but stable compared to yesterday.  Unfortunately, we are not able to start steroids at this time due to SBP.   We will continue supportive measures and monitor closely.  Liver lesions: CT with 2 possible right lobe liver masses 2.1 and 3.0 cm in greatest sizes.  AFP within normal limits.  MRI yesterday and attempt to further characterize these lesions, but exam was limited due to breath motion and ascites; therefore, no masses or suspicious contrast-enhancement was clearly identified.  He will need outpatient triple phase CT.  Anemia:  With IDA. Heme negative this admission. Reported black stool  while drinking but none since October. Received 3 units PRBCs this admission.  Lowest hemoglobin 6.6, now 7.7 today which is stable compared to yesterday. Planning on EGD today and outpatient colonoscopy.    Plan: EGD with propofol with Dr. Levon Hedger today. The risks, benefits, and alternatives have been discussed with the patient in detail. The patient states understanding and desires to proceed.  Will give 10 mg of vitamin K PO after EGD today. Continue to follow LFTs and INR daily. Follow-up on paracentesis culture. Continue lactulose twice daily with goal of 3 BMs daily. Monitor for signs of hepatic encephalopathy. No prednisolone in the setting of SBP. Start Lasix 20 mg and Aldactone 50 mg daily.    LOS: 4 days    04/01/2021, 7:33 AM   Ermalinda Memos, PA-C [facility]

## 2021-04-01 NOTE — Op Note (Signed)
[facility] Patient Name: Craig Andrews Procedure Date: 04/01/2021 2:36 PM MRN: [id number] Date of Birth: 1980/05/18 Attending MD: Katrinka Blazing ,  CSN: [id number] Age: 40 Admit Type: Inpatient Procedure:                Upper GI endoscopy Indications:              Iron deficiency anemia, Cirrhosis rule out                            esophageal varices Providers:                Katrinka Blazing, Angelica Ran, Pandora Leiter,                            Technician Referring MD:              Medicines:                Monitored Anesthesia Care Complications:            No immediate complications. Estimated Blood Loss:     Estimated blood loss: none. Procedure:                Pre-Anesthesia Assessment:                           - Prior to the procedure, a History and Physical                            was performed, and patient medications, allergies                            and sensitivities were reviewed. The patient's                            tolerance of previous anesthesia was reviewed.                           - The risks and benefits of the procedure and the                            sedation options and risks were discussed with the                            patient. All questions were answered and informed                            consent was obtained.                           - ASA Grade Assessment: III - A patient with severe                            systemic disease.                           After obtaining informed consent, the endoscope was  passed under direct vision. Throughout the                            procedure, the patient's blood pressure, pulse, and                            oxygen saturations were monitored continuously. The                            [id number]-H190 ([id number]) scope was introduced through the                            mouth, and advanced to the second part of duodenum.                            The  upper GI endoscopy was accomplished without                            difficulty. The patient tolerated the procedure                            well. Scope In: 2:49:20 PM Scope Out: 3:03:25 PM Total Procedure Duration: 0 hours 14 minutes 5 seconds  Findings:      Three columns of grade III varices with stigmata of recent bleeding were       found in the middle third of the esophagus and in the lower third of the       esophagus,. Red wale signs were present in one of the columns, as there       was presence of of a cherry red spot and a white spot overlying it.       Three bands were successfully placed with complete eradication,       resulting in deflation of varices. There was no bleeding during the       procedure.      Mild portal hypertensive gastropathy was found in the entire examined       stomach. No gastric varices were observed upon careful inspection       inforward and retroflexed view      The examined duodenum was normal. Impression:               - Grade III esophageal varices with stigmata of                            recent bleeding. Completely eradicated. Banded.                           - Portal hypertensive gastropathy.                           - Normal examined duodenum.                           - No specimens collected. Moderate Sedation:      Per Anesthesia Care Recommendation:           - Return patient to hospital ward for ongoing care.                           -  Soft diet today.                           - Repeat upper endoscopy in 4 weeks for                            surveillance.                           - Continue pantoprazole 40 mg twice a day PO.                           - Start nadolol 20 mg qday upon discharge.                           - If negative for SBP, will need for finish                            antibiotics for 7 days - suspect he may have bled                            from varix previously. Procedure Code(s):        ---  Professional ---                           [id number], Esophagogastroduodenoscopy, flexible,                            transoral; with band ligation of esophageal/gastric                            varices Diagnosis Code(s):        --- Professional ---                           K74.60, Unspecified cirrhosis of liver                           I85.11, Secondary esophageal varices with bleeding                           K76.6, Portal hypertension                           K31.89, Other diseases of stomach and duodenum                           D50.9, Iron deficiency anemia, unspecified CPT copyright 2019 [employer]. All rights reserved. The codes documented in this report are preliminary and upon coder review may  be revised to meet current compliance requirements. Katrinka Blazing, MD Katrinka Blazing,  04/01/2021 3:20:52 PM This report has been signed electronically. Number of Addenda: 0

## 2021-04-01 NOTE — Transfer of Care (Signed)
%  Immediate Anesthesia Transfer of Care Note  Patient: Craig Andrews  Procedure(s) Performed: ESOPHAGOGASTRODUODENOSCOPY (EGD) WITH PROPOFOL ESOPHAGEAL BANDING  Patient Location: PACU  Anesthesia Type:General  Level of Consciousness: drowsy  Airway & Oxygen Therapy: Patient Spontanous Breathing and Patient connected to nasal cannula oxygen  Post-op Assessment: Report given to RN and Post -op Vital signs reviewed and stable  Post vital signs: Reviewed and stable  Last Vitals:  Vitals Value Taken Time  BP    Temp    Pulse 95   Resp    SpO2 95%     Last Pain:  Vitals:   04/01/21 1444  TempSrc:   PainSc: 0-No pain      Patients Stated Pain Goal: 0 (03/29/21 1554)  Complications: No notable events documented.

## 2021-04-01 NOTE — Brief Op Note (Signed)
03/28/2021 - 04/01/2021  3:19 PM  PATIENT:  Craig Andrews  40 y.o. male  PRE-OPERATIVE DIAGNOSIS:  IDA, cirrhosis, esophageal variceal screening  POST-OPERATIVE DIAGNOSIS:  Grade 2 esophageal varcies banded x3 , portal hypertensive gastropathy  PROCEDURE:  Procedure(s): ESOPHAGOGASTRODUODENOSCOPY (EGD) WITH PROPOFOL (N/A) ESOPHAGEAL BANDING  SURGEON:  Surgeon(s) and Role:    * Dolores Frame, MD - Primary  Patient underwent EGD under propofol sedation.  Tolerated the procedure adequately. Three columns of grade III varices with stigmata of recent bleeding were found in the middle third of the esophagus and in the lower third of the esophagus,.  Red wale signs were present in one of the columns, as there was presence of of a cherry red spot and a white spot overlying it.  Three bands were successfully placed with complete eradication, resulting in deflation of varices.  There was no bleeding during the procedure.  There was presence of mild portal hypertensive gastropathy, no gastric varices were seen.  Normal duodenum.  RECOMMENDATIONS: - Return patient to hospital ward for ongoing care.  - Soft diet today.  - Repeat upper endoscopy in 4 weeks for surveillance.  - Continue pantoprazole 40 mg twice a day PO. - Start nadolol 20 mg qday upon discharge. - If negative for SBP, will need for finish antibiotics for 7 days - suspect he may have bled from varix previously.  Katrinka Blazing, MD Gastroenterology and Hepatology [facility]

## 2021-04-02 ENCOUNTER — Encounter (HOSPITAL_COMMUNITY): Payer: Self-pay | Admitting: Gastroenterology

## 2021-04-02 LAB — COMPREHENSIVE METABOLIC PANEL
ALT: 30 U/L (ref 0–44)
AST: 67 U/L — ABNORMAL HIGH (ref 15–41)
Albumin: 3.9 g/dL (ref 3.5–5.0)
Alkaline Phosphatase: 59 U/L (ref 38–126)
Anion gap: 8 (ref 5–15)
BUN: 8 mg/dL (ref 6–20)
CO2: 19 mmol/L — ABNORMAL LOW (ref 22–32)
Calcium: 8.5 mg/dL — ABNORMAL LOW (ref 8.9–10.3)
Chloride: 110 mmol/L (ref 98–111)
Creatinine, Ser: 0.73 mg/dL (ref 0.61–1.24)
GFR, Estimated: >60 mL/min (ref >60)
Glucose, Bld: 96 mg/dL (ref 70–99)
Potassium: 4 mmol/L (ref 3.5–5.1)
Sodium: 137 mmol/L (ref 135–145)
Total Bilirubin: 5.3 mg/dL — ABNORMAL HIGH (ref 0.3–1.2)
Total Protein: 6.9 g/dL (ref 6.5–8.1)

## 2021-04-02 LAB — DIFFERENTIAL
Abs Immature Granulocytes: 0.03 10*3/uL (ref 0.00–0.07)
Basophils Absolute: 0.1 10*3/uL (ref 0.0–0.1)
Basophils Relative: 1 %
Eosinophils Absolute: 0.3 10*3/uL (ref 0.0–0.5)
Eosinophils Relative: 4 %
Immature Granulocytes: 0 %
Lymphocytes Relative: 15 %
Lymphs Abs: 1.1 10*3/uL (ref 0.7–4.0)
Monocytes Absolute: 0.6 10*3/uL (ref 0.1–1.0)
Monocytes Relative: 8 %
Neutro Abs: 5 10*3/uL (ref 1.7–7.7)
Neutrophils Relative %: 72 %

## 2021-04-02 LAB — CBC
HCT: 26.8 % — ABNORMAL LOW (ref 39.0–52.0)
Hemoglobin: 8.1 g/dL — ABNORMAL LOW (ref 13.0–17.0)
MCH: 31.8 pg (ref 26.0–34.0)
MCHC: 30.2 g/dL (ref 30.0–36.0)
MCV: 105.1 fL — ABNORMAL HIGH (ref 80.0–100.0)
Platelets: 114 10*3/uL — ABNORMAL LOW (ref 150–400)
RBC: 2.55 MIL/uL — ABNORMAL LOW (ref 4.22–5.81)
RDW: 19.5 % — ABNORMAL HIGH (ref 11.5–15.5)
WBC: 7.1 10*3/uL (ref 4.0–10.5)
nRBC: 0 % (ref 0.0–0.2)

## 2021-04-02 LAB — PROTIME-INR
INR: 1.8 — ABNORMAL HIGH (ref 0.8–1.2)
Prothrombin Time: 20.7 seconds — ABNORMAL HIGH (ref 11.4–15.2)

## 2021-04-02 LAB — AMMONIA: Ammonia: 46 umol/L — ABNORMAL HIGH (ref 9–35)

## 2021-04-02 MED ORDER — PREDNISOLONE 5 MG PO TABS
40.0000 mg | ORAL_TABLET | Freq: Every day | ORAL | Status: DC
  Administered 2021-04-02 – 2021-04-03 (×2): 40 mg via ORAL
  Filled 2021-04-02 (×3): qty 8

## 2021-04-02 NOTE — Anesthesia Postprocedure Evaluation (Signed)
Anesthesia Post Note  Patient: Craig Andrews  Procedure(s) Performed: ESOPHAGOGASTRODUODENOSCOPY (EGD) WITH PROPOFOL ESOPHAGEAL BANDING  Patient location during evaluation: Phase II Anesthesia Type: General Level of consciousness: awake Pain management: pain level controlled Vital Signs Assessment: post-procedure vital signs reviewed and stable Respiratory status: spontaneous breathing and respiratory function stable Cardiovascular status: blood pressure returned to baseline and stable Postop Assessment: no headache and no apparent nausea or vomiting Anesthetic complications: no Comments: Late entry   No notable events documented.   Last Vitals:  Vitals:   04/01/21 1539 04/01/21 2052  BP: 127/87 125/83  Pulse: 87 (!) 102  Resp:  20  Temp: 36.9 C 37.9 C  SpO2: 99% 94%    Last Pain:  Vitals:   04/02/21 0844  TempSrc:   PainSc: 0-No pain                 Windell Norfolk

## 2021-04-02 NOTE — Progress Notes (Signed)
PROGRESS NOTE   Craig Andrews  CZY:[id number] DOB: July 05, 1980 DOA: 03/28/2021 PCP: Elenora Gamma, MD (Inactive)   Chief Complaint  Patient presents with   Jaundice   Level of care: Telemetry  Brief Admission History:  40 y.o. male, with past medical history of longstanding alcohol use, he reports last drink was 02/15/2021, he presents to ED secondary to complaints of tremors, jaundice, abdominal pain, reports symptoms of fatigue started on October, he was told in the past by his PCP he did develop cirrhosis, he continued to drink despite that, he reports swelling in lower extremities, and abdomen over last few weeks, and gradual yellowing of his skin, reports he quit drinking, but symptoms are gradually worsening, he denies any coffee-ground emesis, and any melena or bright red blood per rectum, he reports his stools used to be black in color, but only has been brown, he denies any chest pain or shortness of breath, he denies any illicit drug use besides occasional marijuana.  In ED patient work-up significant for potassium 2.3, sodium 131, phosphorus 1.8, lipase 131, total bilirubin of 7.2, hemoglobin 6.6, with MCV of 109, but Hemoccult negative, platelet count at 140 2K, CT abdomen pelvis significant for ascites, liver cirrhosis and 2 liver lesions, [facility] hospitalist consulted to admit.  Assessment & Plan:   Principal Problem:   Alcoholic hepatitis Active Problems:   Macrocytic anemia   Hypokalemia   Alcoholic cirrhosis of liver with ascites (HCC)   Anemia   Ascites   Pleural effusion   SOB (shortness of breath)   SBP (spontaneous bacterial peritonitis) (HCC)   Lesion of liver  Decompensated alcoholic liver Cirrhosis  - appreciate GI consultation and recommendations - MRI liver indeterminate  -Continue lactulose - lactulose ordered for goal of 2-3 BMs per day - GI team started Aldactone and Lasix on 11/29 -GI team recommends starting prednisone 40mg  daily x1 week, decrease  by 5mg  weekly  Chronic Alcohol Abuse  - Pt reports he has abstained from alcohol since mid Oct 2022.  - Pt says he has no plans to restart alcohol consumption as he does want a liver transplant  IDA  --Hemoglobin stable above 8 posttransfusion - continue iron supplementation -Status post EGD with banding of esophageal varices - transfused total of 3 unit PRBC since admission -Outpatient colonoscopy advised  Hyperammonemia - lactulose started with goal of 2-3 BMs per day - Ammonia is trending down  - Pt not having symptoms of hepatic encephalopathy at this time  Thrombocytopenia  - secondary to chronic liver disease - no active bleeding found   Mild pulmonary edema - TREATED - I gave him lasix 40 mg po on 11/28 and he diuresed 2100 cc urine   Hypokalemia - gave additional magnesium 11/28 - gave additional oral potassium  - repleted   Liver lesions - MRI liver on 11/28 was indeterminate - AFP was 2.7 (WNL) and diagnostic paracentesis studies pending from 11/28  - MRI inconclusive as it was a poor study, recommended outpatient multiphasic contrast enhanced liver protocol CT - GI team will arrange outpatient study on ambulatory follow up.  SBP - prelim fluid testing from paracentesis G pos cocci  -Okay to complete IV Rocephin x7 days on 04/03/2021 - Pleural effusions - Pt had symptoms of SOB when lying flat for MRI and paracentesis --- follow up CXR did not show evidence of any significant pleural effusion = Respiratory status improving with diuretics  DVT prophylaxis: SCDs Code Status: Full  Family Communication: patient, he said  he preferred to update parents himself  Disposition: anticipating DC home in a.m. if continues to improve  status is: Inpatient Remains inpatient appropriate because: IV therapies, inpatient EGD required    Consultants:  GI   Procedures:  MRI Liver 11/28 Paracentesis 11/28   Antimicrobials:  Ceftriaxone IV    Subjective: - Had 1  BM, nonbloody -Tolerating oral intake, abdominal discomfort is better, no fevers  Objective: Vitals:   04/01/21 1515 04/01/21 1539 04/01/21 2052 04/02/21 1316  BP: 129/82 127/87 125/83 122/90  Pulse: 90 87 (!) 102 94  Resp: (!) Temp:  98.4 F (36.9 C) 100.3 F (37.9 C) 98.9 F (37.2 C)  TempSrc:  Oral Oral Oral  SpO2:  99% 94% 95%  Weight:      Height:        Intake/Output Summary (Last 24 hours) at 04/02/2021 1725 Last data filed at 04/02/2021 1300 Gross per 24 hour  Intake 840 ml  Output 200 ml  Net 640 ml   Filed Weights   03/28/21 1258  Weight: 92.8 kg    Examination:   Physical Exam  Gen:- Awake Alert, in no acute distress  HEENT:- [place].AT, +ve sclera icterus Neck-Supple Neck,No JVD,.  Lungs-  CTAB , fair air movement bilaterally  CV- S1, S2 normal, RRR Abd-  +ve B.Sounds, Abd Soft, No tenderness,    Extremity/Skin:- No  edema,   good pedal pulses  Psych-affect is appropriate, oriented x3 Neuro-no new focal deficits, no tremors   Data Reviewed: I have personally reviewed following labs and imaging studies  CBC: Recent Labs  Lab 03/28/21 1534 03/29/21 0355 03/30/21 0250 03/31/21 0351 04/01/21 0542 04/02/21 0505  WBC 11.6* 9.3 8.0 7.5 8.2 7.1  NEUTROABS 9.4*  --   --   --   --  5.0  HGB 6.6* 7.5* 7.0* 7.8* 7.7* 8.1*  HCT 21.7* 23.7* 22.7* 26.1* 25.5* 26.8*  MCV 109.0* 100.0 104.1* 104.8* 104.5* 105.1*  PLT 142* 118* 112* 108* 111* 114*    Basic Metabolic Panel: Recent Labs  Lab 03/28/21 1534 03/29/21 0355 03/29/21 0817 03/30/21 0250 03/31/21 0351 04/01/21 0542 04/02/21 0505  NA 131*  --  135 135 138 136 137  K 2.3*  --  3.2* 3.1* 3.3* 3.8 4.0  CL 92*  --  99 102 108 108 110  CO2 30  --  20* 19*  GLUCOSE 107*  --  105* 97 95 109* 96  BUN 8  --  5* <5* <5* 5* 8  CREATININE 0.84  --  0.66 0.67 0.58* 0.72 0.73  CALCIUM 7.3*  --  7.2* 7.9* 8.3* 8.5* 8.5*  MG 1.9 1.8  --  1.8 1.7 2.0  --   PHOS 1.8* 3.0  --   --   --    --   --     GFR: Estimated Creatinine Clearance: 144.3 mL/min (by C-G formula based on SCr of 0.73 mg/dL).  Liver Function Tests: Recent Labs  Lab 03/29/21 0355 03/30/21 0250 03/31/21 0351 04/01/21 0542 04/02/21 0505  AST 86* 84* 67* 67* 67*  ALT 40 37 ALKPHOS 93 85 67 63 59  BILITOT 6.6* 5.1* 7.5* 5.6* 5.3*  PROT 5.7* 6.1* 6.3* 7.1 6.9  ALBUMIN 1.7* 2.7* 3.4* 4.1 3.9    CBG: Recent Labs  Lab 04/01/21 1515  GLUCAP 97    Recent Results (from the past 240 hour(s))  Resp Panel by RT-PCR (Flu A&B, Covid)  Nasopharyngeal Swab     Status: None   Collection Time: 03/28/21  5:09 PM   Specimen: Nasopharyngeal Swab; Nasopharyngeal(NP) swabs in vial transport medium  Result Value Ref Range Status   SARS Coronavirus 2 by RT PCR NEGATIVE NEGATIVE Final    Comment: (NOTE) SARS-CoV-2 target nucleic acids are NOT DETECTED.  The SARS-CoV-2 RNA is generally detectable in upper respiratory specimens during the acute phase of infection. The lowest concentration of SARS-CoV-2 viral copies this assay can detect is 138 copies/mL. A negative result does not preclude SARS-Cov-2 infection and should not be used as the sole basis for treatment or other patient management decisions. A negative result may occur with  improper specimen collection/handling, submission of specimen other than nasopharyngeal swab, presence of viral mutation(s) within the areas targeted by this assay, and inadequate number of viral copies(<138 copies/mL). A negative result must be combined with clinical observations, patient history, and epidemiological information. The expected result is Negative.  Fact Sheet for Patients:  BloggerCourse.com  Fact Sheet for Healthcare Providers:  SeriousBroker.it  This test is no t yet approved or cleared by the [place] FDA and  has been authorized for detection and/or diagnosis of SARS-CoV-2 by FDA under an  Emergency Use Authorization (EUA). This EUA will remain  in effect (meaning this test can be used) for the duration of the COVID-19 declaration under Section 564(b)(1) of the Act, 21 U.S.C.section 360bbb-3(b)(1), unless the authorization is terminated  or revoked sooner.       Influenza A by PCR NEGATIVE NEGATIVE Final   Influenza B by PCR NEGATIVE NEGATIVE Final    Comment: (NOTE) The Xpert Xpress SARS-CoV-2/FLU/RSV plus assay is intended as an aid in the diagnosis of influenza from Nasopharyngeal swab specimens and should not be used as a sole basis for treatment. Nasal washings and aspirates are unacceptable for Xpert Xpress SARS-CoV-2/FLU/RSV testing.  Fact Sheet for Patients: BloggerCourse.com  Fact Sheet for Healthcare Providers: SeriousBroker.it  This test is not yet approved or cleared by the [place] FDA and has been authorized for detection and/or diagnosis of SARS-CoV-2 by FDA under an Emergency Use Authorization (EUA). This EUA will remain in effect (meaning this test can be used) for the duration of the COVID-19 declaration under Section 564(b)(1) of the Act, 21 U.S.C. section 360bbb-3(b)(1), unless the authorization is terminated or revoked.  Performed at [facility], [address] [place], [place] [address]   MRSA Next Gen by PCR, Nasal     Status: None   Collection Time: 03/28/21  7:31 PM   Specimen: Nasal Mucosa; Nasal Swab  Result Value Ref Range Status   MRSA by PCR Next Gen NOT DETECTED NOT DETECTED Final    Comment: (NOTE) The GeneXpert MRSA Assay (FDA approved for NASAL specimens only), is one component of a comprehensive MRSA colonization surveillance program. It is not intended to diagnose MRSA infection nor to guide or monitor treatment for MRSA infections. Test performance is not FDA approved in patients less than 64 years old. Performed at [facility], [address], [place], [place]  [address]   Gram stain     Status: None   Collection Time: 03/31/21 10:25 AM   Specimen: Peritoneal Washings  Result Value Ref Range Status   Specimen Description PERITONEAL  Final   Special Requests NONE  Final   Gram Stain   Final    GRAM POSITIVE COCCI WBC PRESENT, PREDOMINANTLY MONONUCLEAR CYTOSPIN SMEAR Gram Stain Report Called to,Read Back By and Verified With:  Vivi Martens RN [phone] Marveen Reeks Performed at [facility], [address] [place], [place] [address]    Report Status 03/31/2021 FINAL  Final  Culture, body fluid w Gram Stain-bottle     Status: None (Preliminary result)   Collection Time: 03/31/21 10:25 AM   Specimen: Peritoneal Washings  Result Value Ref Range Status   Specimen Description PERITONEAL ASCITES  Final   Special Requests   Final    BOTTLES DRAWN AEROBIC AND ANAEROBIC Blood Culture results may not be optimal due to an excessive volume of blood received in culture bottles   Culture   Final    NO GROWTH 2 DAYS Performed at [facility], [address], [place], [place] [address]    Report Status PENDING  Incomplete     Radiology Studies: No results found.  Scheduled Meds:  sodium chloride   Intravenous Once   folic acid  1 mg Oral Daily   furosemide  20 mg Oral Daily   lactulose  10 g Oral BID   multivitamin with minerals  1 tablet Oral Daily   nicotine  21 mg Transdermal Daily   pantoprazole  40 mg Oral BID   prednisoLONE  40 mg Oral Daily   spironolactone  50 mg Oral Daily   thiamine  100 mg Oral Daily   Continuous Infusions:  cefTRIAXone (ROCEPHIN)  IV 2 g (04/01/21 1719)    LOS: 5 days   Shon Hale, MD How to contact the [facility] Attending or Consulting provider 7A - 7P or covering provider during after hours 7P -7A, for this patient?  Check the care team in [facility] and look for a) attending/consulting [facility] provider listed and b) the [facility] team listed Log into [email or web address] and use [place]'s universal password to access. If you do not have  the password, please contact the hospital operator. Locate the [facility] provider you are looking for under [facility] and page to a number that you can be directly reached. If you still have difficulty reaching the provider, please page the [facility] [place] [facility] (Director on Call) for the Hospitalists listed on amion for assistance.  04/02/2021, 5:25 PM

## 2021-04-02 NOTE — Progress Notes (Signed)
Subjective: Patient is doing well. Tolerating food without nausea or vomiting. Denies any pain. He had one BM this morning. Denies melena or rectal bleeding. No episodes of confusion. No worsening abdominal swelling or edema to extremeties  Objective: Vital signs in last 24 hours: Temp:  [98.4 F (36.9 C)-100.3 F (37.9 C)] 100.3 F (37.9 C) (11/29 2052) Pulse Rate:  [87-102] 102 (11/29 2052) Resp:  [18-23] 20 (11/29 2052) BP: (122-187)/(82-93) 125/83 (11/29 2052) SpO2:  [94 %-99 %] 94 % (11/29 2052) Last BM Date: 04/01/21 General:   Alert and oriented, pleasant Head:  Normocephalic and atraumatic. Eyes:  sclera icteric  Mouth:  Without lesions, mucosa pink and moist.  Heart:  S1, S2 present, no murmurs noted.  Lungs: Clear to auscultation bilaterally, without wheezing, rales, or rhonchi.  Abdomen:  Bowel sounds present, no guarding or rebound, abdomen full but soft Msk:  Symmetrical without gross deformities. Normal posture. Pulses:  Normal pulses noted. Extremities:  Without clubbing or edema. Neurologic:  Alert and  oriented x4;  grossly normal neurologically. Very mild asterixis present Skin:  Warm and dry, intact without significant lesions. jaundiced Psych:  Alert and cooperative. Normal mood and affect.  Intake/Output from previous day: 11/29 0701 - 11/30 0700 In: 700 [P.O.:400; I.V.:300] Out: 200 [Urine:200]  Lab Results: Recent Labs    03/31/21 0351 04/01/21 0542 04/02/21 0505  WBC 7.5 8.2 7.1  HGB 7.8* 7.7* 8.1*  HCT 26.1* 25.5* 26.8*  PLT 108* 111* 114*   BMET Recent Labs    03/31/21 0351 04/01/21 0542 04/02/21 0505  NA 138 136 137  K 3.3* 3.8 4.0  CL 108 108 110  CO2 23 20* 19*  GLUCOSE 95 109* 96  BUN <5* 5* 8  CREATININE 0.58* 0.72 0.73  CALCIUM 8.3* 8.5* 8.5*   LFT Recent Labs    03/31/21 0351 04/01/21 0542 04/02/21 0505  PROT 6.3* 7.1 6.9  ALBUMIN 3.4* 4.1 3.9  AST 67* 67* 67*  ALT 30 31 30   ALKPHOS 67 63 59  BILITOT 7.5* 5.6*  5.3*   PT/INR Recent Labs    04/01/21 0542 04/02/21 0505  LABPROT 20.0* 20.7*  INR 1.7* 1.8*    Studies/Results: MR ABDOMEN W WO CONTRAST  Result Date: 03/31/2021 CLINICAL DATA:  Characterize possible liver lesions identified by prior CT EXAM: MRI ABDOMEN WITHOUT AND WITH CONTRAST TECHNIQUE: Multiplanar multisequence MR imaging of the abdomen was performed both before and after the administration of intravenous contrast. CONTRAST:  36mL GADAVIST GADOBUTROL 1 MMOL/ML IV SOLN COMPARISON:  CT abdomen pelvis, 03/28/2021 FINDINGS: Lower chest: Small bilateral pleural effusions. Hepatobiliary: Somewhat coarse, nodular contour of the liver. Examination of the liver parenchyma is significantly limited by extensive breath motion and field inhomogeneity artifact. Within this limitation, no mass or suspicious contrast enhancement identified. Subcapsular lesions adjacent to the gallbladder fossa identified by prior CT are not clearly appreciated; the contour of the subcapsular lesion laterally is visible, however there is no intrinsic signal abnormality or differential contrast enhancement to distinguish this from background liver parenchyma (series 13, image 61). Gallbladder wall thickening and pericholecystic fluid, nonspecific in the setting of ascites. No gallstones or biliary ductal dilatation. Pancreas: No mass, inflammatory changes, or other parenchymal abnormality identified. No biliary ductal dilatation. Spleen:  Splenomegaly, maximum coronal span 16.3 cm. Adrenals/Urinary Tract: No masses identified. No evidence of hydronephrosis. Stomach/Bowel: Visualized portions within the abdomen are unremarkable. Vascular/Lymphatic: No pathologically enlarged lymph nodes identified. No abdominal aortic aneurysm demonstrated. Recanalization of the umbilical vein with  abdominal wall varices. Other:  Small volume ascites throughout the abdomen.  Anasarca. Musculoskeletal: No suspicious bone lesions identified.  IMPRESSION: 1. Examination of the liver parenchyma is significantly limited by extensive breath motion and field inhomogeneity artifact secondary to ascites. Within this limitation, no mass or suspicious contrast enhancement is clearly identified. 2. Subcapsular lesions adjacent to the gallbladder fossa identified by prior CT are not clearly appreciated by MR. The contour of the subcapsular lesion laterally is seen, however there is no intrinsic signal abnormality or differential contrast enhancement to distinguish this from background liver parenchyma. These findings are of uncertain significance, particularly within the limitations of this MR examination. Consider follow-up multiphasic contrast enhanced liver protocol CT, generally more robust to breath motion artifact,at 6 months to ensure stability. 3. Gallbladder wall thickening and pericholecystic fluid, nonspecific in the setting of ascites. No gallstones or biliary ductal dilatation. 4. Cirrhosis, splenomegaly, and umbilical varices. 5. Pleural effusions, ascites, and anasarca. Electronically Signed   By: Jearld Lesch M.D.   On: 03/31/2021 10:13   US Paracentesis  Result Date: 03/31/2021 INDICATION: Jaundice, ascites, cirrhotic appearing liver by CT question alcoholic cirrhosis EXAM: ULTRASOUND GUIDED DIAGNOSTIC AND THERAPEUTIC PARACENTESIS MEDICATIONS: None. COMPLICATIONS: None immediate. PROCEDURE: Informed written consent was obtained from the patient after a discussion of the risks, benefits and alternatives to treatment. A timeout was performed prior to the initiation of the procedure. Initial ultrasound scanning demonstrates a moderate amount of ascites within the right lower abdominal quadrant. The right lower abdomen was prepped and draped in the usual sterile fashion. 1% lidocaine was used for local anesthesia. Following this, a 5 [place] Yueh catheter was introduced. An ultrasound image was saved for documentation purposes. The paracentesis  was performed. The catheter was removed and a dressing was applied. The patient tolerated the procedure well without immediate post procedural complication. FINDINGS: A total of approximately 1.5 L of slightly cloudy bright yellow ascitic fluid was removed. Samples were sent to the laboratory as requested by the clinical team. IMPRESSION: Successful ultrasound-guided paracentesis yielding 1.5 liters of peritoneal fluid. Electronically Signed   By: Ulyses Southward M.D.   On: 03/31/2021 11:00   DG Chest Port 1 View  Result Date: 03/31/2021 CLINICAL DATA:  Shortness of breath, pleural effusions, cirrhosis, ascites EXAM: PORTABLE CHEST 1 VIEW COMPARISON:  Portable exam 1317 hours compared to 08/27/2020 FINDINGS: Upper normal heart size with slight vascular congestion. Mediastinal contours normal. Slight accentuation of interstitial markings, question minimal edema. No gross pleural effusions identified on portable exam. No pneumothorax. Osseous structures unremarkable. IMPRESSION: Slight pulmonary vascular congestion and accentuation of interstitial markings, question minimal pulmonary edema. Electronically Signed   By: Ulyses Southward M.D.   On: 03/31/2021 14:11    Assessment: 40 year old male with history of ETOH abuse and cirrhosis who presented with decompensation in setting of worsening ascites, new onset jaundice, and alcoholic hepatitis. Notably, found to have 2 right lobe liver masses on CT.  Cirrhosis: Secondary to ETOH abuse, MELD today: 13, down from 21 yesterday.  Acute hepatitis panel negative, immune to Hep A, needs Hep B vaccination. Additional serologies to r/o Autoimmune hepatitis were WNL. Mildly elevated ammonia during admission without overt HE, on lactulose BID, has had one BM today so far. Ammonia relatively the same as yesterday 46 (44). Had small volume ascites for which he underwent paracentesis on Monday, yielding 1.5L, PMNs low at 2%, gram stain with gram positive Cocci, however, culture  negative. [facility] pharmacist had organism slides sent to [facility]  [place] for re-evaluation of initial slide and re-staining of secondary slide to determine if initial result was incorrect, there were no organisms seen with either slide, indicating initial result of gram positive Cocci was incorrect. Will finish up course of Ceftriaxone as tomorrow will be the 7th and final day, given hx of possible variceal bleeding prior to admission, however, no evidence of SBP.   Alcoholic Hepatitis: DF today 39.3 (PT control 13.3), up from 37.8 yesterday. Bilirubin continuing to trend down, however, INR continues to trend up at 1.8 today, from 1.7 yesterday, despite administration of Vitamin K yesterday afternoon. Will start prednisolone given re evaluation of organism slides from paracentesis were negative, indicating no presence of SBP.  Liver lesions: CT with two R lobe liver lesions, 2.1 and 3 cm at largest points. AFP WNL. MRI Monday was not successful at clearly identifying lesions due to movement and ascites, will need outpatient triple phase CT for further evaluation of these.   Anemia: IDA, FOBT negative. Previously reported melena, but none since October, has received 3 units PRBCs total this admission, lowest hgb 6.6, now 8.1. had EGD yesterday with findings of esophageal varices, s/p banding. No further melena or rectal bleeding reported.   Plan: Finish course of ceftriaxone, tomorrow will be 7th and final day Trend LFTs and INR daily Continue lactulose BID, goal 2-3 soft BMs daily Continue to monitor for signs of overt HE Continue lasix 20mg  and aldactone 50mg  daily Will Start prednisolone taper for alcoholic hepatitis (40mg  x1 week, decrease by 5mg  weekly, thereafter) Continue PPI BID   LOS: 5 days    04/02/2021, 8:50 AM   Aaleigha Bozza L. , MSN, APRN, AGNP-C Adult-Gerontology Nurse Practitioner [facility]

## 2021-04-03 ENCOUNTER — Telehealth: Payer: Self-pay | Admitting: Gastroenterology

## 2021-04-03 LAB — COMPREHENSIVE METABOLIC PANEL
ALT: 35 U/L (ref 0–44)
AST: 68 U/L — ABNORMAL HIGH (ref 15–41)
Albumin: 3.8 g/dL (ref 3.5–5.0)
Alkaline Phosphatase: 74 U/L (ref 38–126)
Anion gap: 6 (ref 5–15)
BUN: 10 mg/dL (ref 6–20)
CO2: 19 mmol/L — ABNORMAL LOW (ref 22–32)
Calcium: 8.7 mg/dL — ABNORMAL LOW (ref 8.9–10.3)
Chloride: 111 mmol/L (ref 98–111)
Creatinine, Ser: 0.6 mg/dL — ABNORMAL LOW (ref 0.61–1.24)
GFR, Estimated: >60 mL/min (ref >60)
Glucose, Bld: 120 mg/dL — ABNORMAL HIGH (ref 70–99)
Potassium: 4.2 mmol/L (ref 3.5–5.1)
Sodium: 136 mmol/L (ref 135–145)
Total Bilirubin: 4.3 mg/dL — ABNORMAL HIGH (ref 0.3–1.2)
Total Protein: 7.2 g/dL (ref 6.5–8.1)

## 2021-04-03 LAB — PROTIME-INR
INR: 1.7 — ABNORMAL HIGH (ref 0.8–1.2)
Prothrombin Time: 19.5 seconds — ABNORMAL HIGH (ref 11.4–15.2)

## 2021-04-03 LAB — CBC
HCT: 27.1 % — ABNORMAL LOW (ref 39.0–52.0)
Hemoglobin: 8.4 g/dL — ABNORMAL LOW (ref 13.0–17.0)
MCH: 32.8 pg (ref 26.0–34.0)
MCHC: 31 g/dL (ref 30.0–36.0)
MCV: 105.9 fL — ABNORMAL HIGH (ref 80.0–100.0)
Platelets: 122 10*3/uL — ABNORMAL LOW (ref 150–400)
RBC: 2.56 MIL/uL — ABNORMAL LOW (ref 4.22–5.81)
RDW: 18.9 % — ABNORMAL HIGH (ref 11.5–15.5)
WBC: 6.3 10*3/uL (ref 4.0–10.5)
nRBC: 0 % (ref 0.0–0.2)

## 2021-04-03 MED ORDER — FUROSEMIDE 20 MG PO TABS
20.0000 mg | ORAL_TABLET | Freq: Every day | ORAL | 3 refills | Status: DC
Start: 2021-04-04 — End: 2021-08-04

## 2021-04-03 MED ORDER — PREDNISONE 10 MG PO TABS: 10.0000 mg | ORAL_TABLET | ORAL | 1 refills | Status: DC

## 2021-04-03 MED ORDER — NICOTINE 21 MG/24HR TD PT24: 21.0000 mg | MEDICATED_PATCH | Freq: Every day | TRANSDERMAL | 0 refills | Status: DC

## 2021-04-03 MED ORDER — LACTULOSE 10 GM/15ML PO SOLN
10.0000 g | Freq: Two times a day (BID) | ORAL | 3 refills | Status: DC
Start: 2021-04-03 — End: 2021-05-06

## 2021-04-03 MED ORDER — SODIUM CHLORIDE 0.9 % IV SOLN
2.0000 g | Freq: Once | INTRAVENOUS | Status: AC
  Administered 2021-04-03: 2 g via INTRAVENOUS
  Filled 2021-04-03: qty 20

## 2021-04-03 MED ORDER — OMEPRAZOLE MAGNESIUM 20 MG PO TBEC: 20.0000 mg | DELAYED_RELEASE_TABLET | Freq: Two times a day (BID) | ORAL | 2 refills | Status: DC

## 2021-04-03 MED ORDER — FOLIC ACID 1 MG PO TABS: 1.0000 mg | ORAL_TABLET | Freq: Every day | ORAL | 3 refills | Status: DC

## 2021-04-03 MED ORDER — SPIRONOLACTONE 50 MG PO TABS: 50.0000 mg | ORAL_TABLET | Freq: Every day | ORAL | 3 refills | Status: DC

## 2021-04-03 MED ORDER — THIAMINE HCL 100 MG PO TABS
100.0000 mg | ORAL_TABLET | Freq: Every day | ORAL | 3 refills | Status: DC
Start: 2021-04-04 — End: 2021-08-06

## 2021-04-03 NOTE — Discharge Instructions (Signed)
1)Complete Abstinence from Tobacco advised--May use over-the-counter nicotine patch 2)Complete Abstinence from Alcohol advised--- outpatient alcohol rehab program including AA meetings strongly advised 3)Take Prednisone 4 Tab (40 mg) daily x 4 weeks, then 3 Tabs (30 mg) daily x 5 days, then 2 tab (20mg ) daily x 5 days, then 10 mg (1 tab) daily x 5 days --- Then STOP 4)Follow up with Gastroenterologist Dr. for repeat EGD-upper endoscopy in 4 weeks for esophageal varices.  5)Repeat CBC, CMET, PT/INR on Monday 04/07/21 6)You will need follow up CT Liver imaging for liver lesions. 7)Follow up with Dr. 14/05/22-- address [address], [place] Number [phone]  8)Avoid ibuprofen/Advil/Aleve/Motrin/Goody Powders/Naproxen/BC powders/Meloxicam/Diclofenac/Indomethacin and other Nonsteroidal anti-inflammatory medications as these will make you more likely to bleed and can cause stomach ulcers, can also cause Kidney problems. 9)You Will need paracentesis procedure--- from time to time to remove fluid from your belly

## 2021-04-03 NOTE — Telephone Encounter (Addendum)
Patient to be discharged today.   He needs repeat EGD in 4 weeks for esophageal varices.  May need to be seen in office prior to that, would recommend discussing with Dr. Levon Hedger.  He also needs CBC, CMET, PT/INR on Monday.  Needs follow up liver imaging for liver lesions.

## 2021-04-03 NOTE — Progress Notes (Addendum)
Subjective:  No complaints. No abdominal pain, n/v.   Objective: Vital signs in last 24 hours: Temp:  [97 F (36.1 C)-98.9 F (37.2 C)] 98 F (36.7 C) (12/01 0448) Pulse Rate:  [84-94] 84 (12/01 0448) Resp:  [19-20] 20 (12/01 0448) BP: (114-122)/(80-90) 114/80 (12/01 0448) SpO2:  [95 %-97 %] 96 % (12/01 0448) Last BM Date: 04/02/21 General:   Alert,  Well-developed, well-nourished, pleasant and cooperative in NAD. Head:  Normocephalic and atraumatic. Eyes:  Sclera clear, no icterus.  Abdomen:  Soft, nontender. Mild distention. Normal bowel sounds, without guarding, and without rebound.   Extremities:  Without clubbing, deformity or edema. Neurologic:  Alert and  oriented x4;  grossly normal neurologically. Skin:  Intact without significant lesions or rashes. Psych:  Alert and cooperative. Normal mood and affect.  Intake/Output from previous day: 11/30 0701 - 12/01 0700 In: 1080 [P.O.:1080] Out: -  Intake/Output this shift: No intake/output data recorded.  Lab Results: CBC Recent Labs    04/01/21 0542 04/02/21 0505 04/03/21 0525  WBC 8.2 7.1 6.3  HGB 7.7* 8.1* 8.4*  HCT 25.5* 26.8* 27.1*  MCV 104.5* 105.1* 105.9*  PLT 111* 114* 122*   BMET Recent Labs    04/01/21 0542 04/02/21 0505 04/03/21 0525  NA 136 137 136  K 3.8 4.0 4.2  CL 108 110 111  CO2 20* 19* 19*  GLUCOSE 109* 96 120*  BUN 5* 8 10  CREATININE 0.72 0.73 0.60*  CALCIUM 8.5* 8.5* 8.7*   LFTs Recent Labs    04/01/21 0542 04/02/21 0505 04/03/21 0525  BILITOT 5.6* 5.3* 4.3*  ALKPHOS 63 59 74  AST 67* 67* 68*  ALT 31 30 35  PROT 7.1 6.9 7.2  ALBUMIN 4.1 3.9 3.8   No results for input(s): LIPASE in the last 72 hours. PT/INR Recent Labs    04/01/21 0542 04/02/21 0505 04/03/21 0525  LABPROT 20.0* 20.7* 19.5*  INR 1.7* 1.8* 1.7*      Imaging Studies: MR ABDOMEN W WO CONTRAST  Result Date: 03/31/2021 CLINICAL DATA:  Characterize possible liver lesions identified by prior CT EXAM:  MRI ABDOMEN WITHOUT AND WITH CONTRAST TECHNIQUE: Multiplanar multisequence MR imaging of the abdomen was performed both before and after the administration of intravenous contrast. CONTRAST:  10mL GADAVIST GADOBUTROL 1 MMOL/ML IV SOLN COMPARISON:  CT abdomen pelvis, 03/28/2021 FINDINGS: Lower chest: Small bilateral pleural effusions. Hepatobiliary: Somewhat coarse, nodular contour of the liver. Examination of the liver parenchyma is significantly limited by extensive breath motion and field inhomogeneity artifact. Within this limitation, no mass or suspicious contrast enhancement identified. Subcapsular lesions adjacent to the gallbladder fossa identified by prior CT are not clearly appreciated; the contour of the subcapsular lesion laterally is visible, however there is no intrinsic signal abnormality or differential contrast enhancement to distinguish this from background liver parenchyma (series 13, image 61). Gallbladder wall thickening and pericholecystic fluid, nonspecific in the setting of ascites. No gallstones or biliary ductal dilatation. Pancreas: No mass, inflammatory changes, or other parenchymal abnormality identified. No biliary ductal dilatation. Spleen:  Splenomegaly, maximum coronal span 16.3 cm. Adrenals/Urinary Tract: No masses identified. No evidence of hydronephrosis. Stomach/Bowel: Visualized portions within the abdomen are unremarkable. Vascular/Lymphatic: No pathologically enlarged lymph nodes identified. No abdominal aortic aneurysm demonstrated. Recanalization of the umbilical vein with abdominal wall varices. Other:  Small volume ascites throughout the abdomen.  Anasarca. Musculoskeletal: No suspicious bone lesions identified. IMPRESSION: 1. Examination of the liver parenchyma is significantly limited by extensive breath motion and field inhomogeneity  artifact secondary to ascites. Within this limitation, no mass or suspicious contrast enhancement is clearly identified. 2. Subcapsular  lesions adjacent to the gallbladder fossa identified by prior CT are not clearly appreciated by MR. The contour of the subcapsular lesion laterally is seen, however there is no intrinsic signal abnormality or differential contrast enhancement to distinguish this from background liver parenchyma. These findings are of uncertain significance, particularly within the limitations of this MR examination. Consider follow-up multiphasic contrast enhanced liver protocol CT, generally more robust to breath motion artifact,at 6 months to ensure stability. 3. Gallbladder wall thickening and pericholecystic fluid, nonspecific in the setting of ascites. No gallstones or biliary ductal dilatation. 4. Cirrhosis, splenomegaly, and umbilical varices. 5. Pleural effusions, ascites, and anasarca. Electronically Signed   By: Jearld Lesch M.D.   On: 03/31/2021 10:13   CT Abdomen Pelvis W Contrast  Result Date: 03/28/2021 CLINICAL DATA:  Abdominal distension, nausea, jaundice, alcohol use, increasing jaundice and bowel incontinence, patient reports stopping drinking in October, history hypertension, smoking EXAM: CT ABDOMEN AND PELVIS WITH CONTRAST TECHNIQUE: Multidetector CT imaging of the abdomen and pelvis was performed using the standard protocol following bolus administration of intravenous contrast. CONTRAST:  OMNIPAQUE IOHEXOL 300 MG/ML SOLN IV. No oral contrast. COMPARISON:  None FINDINGS: Lower chest: Minimal bibasilar atelectasis Hepatobiliary: Mildly thickened gallbladder wall, nonspecific in the setting of ascites. Nodular cirrhotic appearing liver. 6 mm low-attenuation focus LEFT lobe liver question tiny cyst. Small subcapsular mass laterally RIGHT lobe liver 2.1 x 1.9 x 2.0 cm. Question additional nodule medially RIGHT lobe liver adjacent to gallbladder fossa, 3.0 x 2.0 x 1.7 cm. No biliary dilatation. Pancreas: Normal appearance Spleen: Normal appearance Adrenals/Urinary Tract: Adrenal glands, kidneys, ureters,  and bladder normal appearance Stomach/Bowel: Normal appendix. Mild wall thickening of distal cecum without evidence of appendicitis. Few small bowel loops in the LEFT mid abdomen is of questionable wall thickening versus artifact from underdistention and ascites. Stomach and bowel loops otherwise normal appearance. Question esophageal versus paraesophageal varices. Vascular/Lymphatic: Atherosclerotic calcifications aorta. Anterior abdominal wall collaterals and recannulization of umbilical vein. Vascular structures patent. No adenopathy. Reproductive: Unremarkable prostate gland and seminal vesicles Other: Ascites, predominantly in pelvis.  No free air.  No hernia. Musculoskeletal: Unremarkable IMPRESSION: Cirrhotic appearing liver with ascites, anterior abdominal wall collaterals, and recannulization of umbilical vein. Question 2 RIGHT lobe liver masses 2.1 and 3.0 cm in greatest sizes, indeterminate; further characterization by non emergent MR imaging with and without contrast recommended. Mildly thickened gallbladder wall, nonspecific in the setting of ascites. Few small bowel loops in the LEFT mid abdomen with questionable wall thickening versus artifact from underdistention and ascites. Aortic Atherosclerosis (ICD10-I70.0). Electronically Signed   By: Ulyses Southward M.D.   On: 03/28/2021 16:54   US Paracentesis  Result Date: 03/31/2021 INDICATION: Jaundice, ascites, cirrhotic appearing liver by CT question alcoholic cirrhosis EXAM: ULTRASOUND GUIDED DIAGNOSTIC AND THERAPEUTIC PARACENTESIS MEDICATIONS: None. COMPLICATIONS: None immediate. PROCEDURE: Informed written consent was obtained from the patient after a discussion of the risks, benefits and alternatives to treatment. A timeout was performed prior to the initiation of the procedure. Initial ultrasound scanning demonstrates a moderate amount of ascites within the right lower abdominal quadrant. The right lower abdomen was prepped and draped in the usual  sterile fashion. 1% lidocaine was used for local anesthesia. Following this, a 5 [place] Yueh catheter was introduced. An ultrasound image was saved for documentation purposes. The paracentesis was performed. The catheter was removed and a dressing was applied. The patient  tolerated the procedure well without immediate post procedural complication. FINDINGS: A total of approximately 1.5 L of slightly cloudy bright yellow ascitic fluid was removed. Samples were sent to the laboratory as requested by the clinical team. IMPRESSION: Successful ultrasound-guided paracentesis yielding 1.5 liters of peritoneal fluid. Electronically Signed   By: Ulyses Southward M.D.   On: 03/31/2021 11:00   DG Chest Port 1 View  Result Date: 03/31/2021 CLINICAL DATA:  Shortness of breath, pleural effusions, cirrhosis, ascites EXAM: PORTABLE CHEST 1 VIEW COMPARISON:  Portable exam 1317 hours compared to 08/27/2020 FINDINGS: Upper normal heart size with slight vascular congestion. Mediastinal contours normal. Slight accentuation of interstitial markings, question minimal edema. No gross pleural effusions identified on portable exam. No pneumothorax. Osseous structures unremarkable. IMPRESSION: Slight pulmonary vascular congestion and accentuation of interstitial markings, question minimal pulmonary edema. Electronically Signed   By: Ulyses Southward M.D.   On: 03/31/2021 14:11   US LIVER DOPPLER  Result Date: 03/31/2021 CLINICAL DATA:  Alcoholic cirrhosis EXAM: DUPLEX ULTRASOUND OF LIVER TECHNIQUE: Color and duplex Doppler ultrasound was performed to evaluate the hepatic in-flow and out-flow vessels. Technologist describes technically difficult study secondary to body habitus and overlying bowel gas. COMPARISON:  CT 03/28/2021 FINDINGS: Liver: Heterogenous echotexture.  Nodular contour. The 2 right lobe lesions suspected on recent CT are less conspicuous on this exam. Main Portal Vein size: 1.4 cm Portal Vein Velocities (all hepatopetal):  Main Prox:  29 cm/sec Main Mid: 33 cm/sec Main Dist:  37 cm/sec Right: 30 cm/sec Left: 23 cm/sec Hepatic Vein Velocities (all hepatofugal): Right:  48 cm/sec Middle:  36 cm/sec Left:  35 cm/sec IVC: Present and patent with normal respiratory phasicity., 2.7 cm diameter Hepatic Artery Velocity:  129 cm/sec Splenic Vein Velocity:  41 cm/sec Spleen: 10.7 cm x 7.9 cm x 13.9 cm with a total volume of 610 cm^3 (411 cm^3 is upper limit normal) Portal Vein Occlusion/Thrombus: No Splenic Vein Occlusion/Thrombus: No Ascites: Small volume Varices: None identified IMPRESSION: 1. Unremarkable hepatic vascular Doppler evaluation 2. Nodular liver contour suggesting cirrhosis. 3. Small volume abdominal ascites Electronically Signed   By: Corlis Leak M.D.   On: 03/31/2021 07:40  [2 weeks]   Assessment:  40 y/o male with h/o ETOH abuse and cirrhosis presenting with decompensation in setting of worsening ascites, new onset jaundice, and alcoholic hepatitis. Noted to have 2 right lobe liver masses on CT.   Alcoholic Hepatitis: DF 39.3 yesterday, 32.8 (using same PT control of 13.3) today. Prednisolone started yesterday.  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 MELD 22-->19-->19. Acute Hepatitis panel negative. Immune to Hep A. Needs Hep B vaccination. Additional serologies to r/o Autoimmune hepatitis unremarkable. Ceruloplasmin 24.5. U/S liver doppler without thrombus. Mildly elevated ammonia without overt HE. Had small volume ascites, para on Monday, yielding 1.5L. PMNs low at 2%, gram stain with gram positive cocci, culture negative. [facility] lab had organism slides sent to [facility] for re-evaluation of initial slide and re-staining of secondary slide to determine if initial result was incorrect, there were no organisms seen with either slide, indicating initial result of gram positive Cocci was incorrect. Will finish up course of Ceftriaxone as tomorrow will be the 7th and final day, given hx of possible variceal bleeding  prior to admission, however, no evidence of SBP.    Liver lesions: CT with two R lobe liver lesion, 2.1 X 1.0 X 2.0 cm and 3 X 2 X 1.7 cm. AFP normal. MRI Monday not successful at clearly identifying lesions due  to movement and ascites, will need outpatient triple phase CT for further evaluation.   Anemia: IDA. Ifobt negative. Previously reported melena, but none since October. Has received 3 units of prbcs this admission. EGD yesterday with 3 columns of grade III varices with stigmata of recent bleeding, s/p banding, portal hypertensive gastropathy. Hgb at lowest 6.6. Hgb 8.4 today.   Plan: To complete course of ceftriaxone today.  Repeat EGD in 4 weeks for esophageal variceal surveillance.  Nadolol 20mg  daily, start at discharge. Continue prednisolone 40mg  daily for 4 weeks, then rapid taper over 2 weeks. Will need to check response to steroids at day 7, if no significant improvement then would discontinue.  Continue lasix, aldactone.  2 gram sodium diet.  Etoh cessation.  Follow up outpatient imaging for liver lesions per Dr. .  Will make arrangements for outpatient follow up.   . Levon Hedger [facility] [phone] 12/1/20221:22 PM    LOS: 6 days

## 2021-04-03 NOTE — Telephone Encounter (Signed)
Please schedule a repea t EGD in 4 weeks for esophageal varices, room 3 Also please schedule a CT liver Dx: liver mass, cirrhosis Please send the workup as described below so he can have it done Monday  Follow up in office in 2-3 weeks

## 2021-04-04 ENCOUNTER — Other Ambulatory Visit (INDEPENDENT_AMBULATORY_CARE_PROVIDER_SITE_OTHER): Payer: Self-pay

## 2021-04-04 DIAGNOSIS — K7011 Alcoholic hepatitis with ascites: Secondary | ICD-10-CM

## 2021-04-04 DIAGNOSIS — K7031 Alcoholic cirrhosis of liver with ascites: Secondary | ICD-10-CM

## 2021-04-04 DIAGNOSIS — E876 Hypokalemia: Secondary | ICD-10-CM

## 2021-04-04 DIAGNOSIS — K769 Liver disease, unspecified: Secondary | ICD-10-CM

## 2021-04-04 DIAGNOSIS — D539 Nutritional anemia, unspecified: Secondary | ICD-10-CM

## 2021-04-04 DIAGNOSIS — D649 Anemia, unspecified: Secondary | ICD-10-CM

## 2021-04-04 NOTE — Telephone Encounter (Signed)
Jonna Dittrich:I have called and left a message for the patient to return call to me at the office. Orders placed in Epic.  Please send the workup as described below so he can have it done Monday   LeighAnn:Please schedule a repeat EGD in 4 weeks for esophageal varices, room 3 Also please schedule a CT liver Dx: liver mass, cirrhosis   Mitzie:Follow up in office in 2-3 weeks.

## 2021-04-04 NOTE — Telephone Encounter (Signed)
Orders for labs placed at the front desk patient aware to come pick them up.

## 2021-04-04 NOTE — Telephone Encounter (Signed)
I have spoke with the patient he is aware to come by the office to pick up the orders on Monday 04/09/2021. He is aware that the office will contact him to set up EGD and CT as well as the office visit.

## 2021-04-05 LAB — CULTURE, BODY FLUID W GRAM STAIN -BOTTLE: Culture: NO GROWTH

## 2021-04-08 LAB — COMPREHENSIVE METABOLIC PANEL
AG Ratio: 1.1 (calc) (ref 1.0–2.5)
ALT: 103 U/L — ABNORMAL HIGH (ref 9–46)
AST: 150 U/L — ABNORMAL HIGH (ref 10–40)
Albumin: 4.2 g/dL (ref 3.6–5.1)
Alkaline phosphatase (APISO): 112 U/L (ref 36–130)
BUN: 13 mg/dL (ref 7–25)
CO2: 23 mmol/L (ref 20–32)
Calcium: 9.5 mg/dL (ref 8.6–10.3)
Chloride: 107 mmol/L (ref 98–110)
Creat: 0.64 mg/dL (ref 0.60–1.26)
Globulin: 3.8 g/dL (calc) — ABNORMAL HIGH (ref 1.9–3.7)
Glucose, Bld: 97 mg/dL (ref 65–99)
Potassium: 4 mmol/L (ref 3.5–5.3)
Sodium: 141 mmol/L (ref 135–146)
Total Bilirubin: 3.9 mg/dL — ABNORMAL HIGH (ref 0.2–1.2)
Total Protein: 8 g/dL (ref 6.1–8.1)

## 2021-04-08 LAB — CBC WITH DIFFERENTIAL/PLATELET
Absolute Monocytes: 1074 cells/uL — ABNORMAL HIGH (ref 200–950)
Basophils Absolute: 61 cells/uL (ref 0–200)
Basophils Relative: 0.5 %
Eosinophils Absolute: 220 cells/uL (ref 15–500)
Eosinophils Relative: 1.8 %
HCT: 30.8 % — ABNORMAL LOW (ref 38.5–50.0)
Hemoglobin: 10.1 g/dL — ABNORMAL LOW (ref 13.2–17.1)
Lymphs Abs: 1659 cells/uL (ref 850–3900)
MCH: 31.5 pg (ref 27.0–33.0)
MCHC: 32.8 g/dL (ref 32.0–36.0)
MCV: 96 fL (ref 80.0–100.0)
MPV: 10.9 fL (ref 7.5–12.5)
Monocytes Relative: 8.8 %
Neutro Abs: 9187 cells/uL — ABNORMAL HIGH (ref 1500–7800)
Neutrophils Relative %: 75.3 %
Platelets: 189 10*3/uL (ref 140–400)
RBC: 3.21 10*6/uL — ABNORMAL LOW (ref 4.20–5.80)
RDW: 15.6 % — ABNORMAL HIGH (ref 11.0–15.0)
Total Lymphocyte: 13.6 %
WBC: 12.2 10*3/uL — ABNORMAL HIGH (ref 3.8–10.8)

## 2021-04-08 LAB — PROTIME-INR
INR: 1.3 — ABNORMAL HIGH
Prothrombin Time: 12.9 s — ABNORMAL HIGH (ref 9.0–11.5)

## 2021-04-09 ENCOUNTER — Other Ambulatory Visit (INDEPENDENT_AMBULATORY_CARE_PROVIDER_SITE_OTHER): Payer: Self-pay

## 2021-04-09 ENCOUNTER — Encounter (INDEPENDENT_AMBULATORY_CARE_PROVIDER_SITE_OTHER): Payer: Self-pay

## 2021-04-09 DIAGNOSIS — I8500 Esophageal varices without bleeding: Secondary | ICD-10-CM

## 2021-04-09 DIAGNOSIS — K769 Liver disease, unspecified: Secondary | ICD-10-CM

## 2021-04-09 DIAGNOSIS — K7031 Alcoholic cirrhosis of liver with ascites: Secondary | ICD-10-CM

## 2021-04-10 ENCOUNTER — Encounter (INDEPENDENT_AMBULATORY_CARE_PROVIDER_SITE_OTHER): Payer: Self-pay

## 2021-04-11 NOTE — Discharge Summary (Signed)
Craig Andrews, is a 40 y.o. male  DOB 04/14/1981  MRN [id number].  Admission date:  03/28/2021  Admitting Physician  Starleen Arms, MD  Discharge Date:  04/11/2021   Primary MD  Elenora Gamma, MD (Inactive)  Recommendations for primary care physician for things to follow:    Admission Diagnosis  Hypokalemia [E87.6] Alcoholic hepatitis [K70.10] Alcoholic hepatitis with ascites [K70.11] Anemia, unspecified type [D64.9]   Discharge Diagnosis  Hypokalemia [E87.6] Alcoholic hepatitis [K70.10] Alcoholic hepatitis with ascites [K70.11] Anemia, unspecified type [D64.9]    Principal Problem:   Alcoholic hepatitis Active Problems:   Macrocytic anemia   Hypokalemia   Alcoholic cirrhosis of liver with ascites (HCC)   Anemia   Ascites   Pleural effusion   SOB (shortness of breath)   SBP (spontaneous bacterial peritonitis) (HCC)   Lesion of liver      Past Medical History:  Diagnosis Date   Allergy    Hypertension     Past Surgical History:  Procedure Laterality Date   ESOPHAGEAL BANDING  04/01/2021   Procedure: ESOPHAGEAL BANDING;  Surgeon: Dolores Frame, MD;  Location: [facility];  Service: Gastroenterology;;   ESOPHAGOGASTRODUODENOSCOPY (EGD) WITH PROPOFOL N/A 04/01/2021   Procedure: ESOPHAGOGASTRODUODENOSCOPY (EGD) WITH PROPOFOL;  Surgeon: Dolores Frame, MD;  Location: [facility];  Service: Gastroenterology;  Laterality: N/A;   FRACTURE SURGERY     rod inside leg         HPI  from the history and physical done on the day of admission:   Craig Andrews  is a 40 y.o. male, with past medical history of longstanding alcohol use, he reports last drink was 02/15/2021, he presents to ED secondary to complaints of tremors, jaundice, abdominal pain, reports symptoms of fatigue started on October, he was told in the past by his PCP he did develop cirrhosis,  he continued to drink despite that, he reports swelling in lower extremities, and abdomen over last few weeks, and gradual yellowing of his skin, reports he quit drinking, but symptoms are gradually worsening, he denies any coffee-ground emesis, and any melena or bright red blood per rectum, he reports his stools used to be black in color, but only has been brown, he denies any chest pain or shortness of breath, he denies any illicit drug use besides occasional marijuana. - in ED patient work-up significant for potassium 2.3, sodium 131, phosphorus 1.8, lipase 131, total bilirubin of 7.2, hemoglobin 6.6, with MCV of 109, but Hemoccult negative, platelet count at 140 2K, CT abdomen pelvis significant for ascites, liver cirrhosis and 2 liver lesions, [facility] consulted to admit.      Hospital Course:    Decompensated alcoholic liver Cirrhosis  - appreciate GI consultation and recommendations - MRI liver indeterminate  -Improved with lactulose lactulose -goal of 2-3 BMs per day - GI team started Aldactone and Lasix  -GI team recommends starting prednisone  daily with very slow taper  Chronic Alcohol Abuse  - Pt reports he has abstained from alcohol  since mid Oct 2022.  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   SBP - prelim fluid testing from paracentesis G pos cocci  Treated with IV Rocephin x7 days last dose  on 04/03/2021 - Pleural effusions - Pt had symptoms of SOB when lying flat for MRI and paracentesis --- follow up CXR did not show evidence of any significant pleural effusion = Respiratory status improved with diuretics     Code Status: Full  Family Communication: patient, he said he preferred to update parents himself  Disposition: Home Consultants:  GI    Procedures:  MRI Liver 11/28 Paracentesis 11/28    Antimicrobials:  Ceftriaxone IV     Discharge Condition: Stable  Follow UP--outpatient GI follow-up advised   Consults obtained - Gi  Diet and Activity recommendation:  As advised  Discharge Instructions    Discharge Instructions     Call MD for:  difficulty breathing, headache or visual disturbances   Complete by: As directed    Call MD for:  persistant dizziness or light-headedness   Complete by: As directed    Call MD for:  persistant nausea and vomiting   Complete by: As directed    Call MD for:  temperature >100.4   Complete by: As directed    Diet - low sodium heart healthy   Complete by: As directed    Discharge instructions   Complete by: As directed    1)Complete Abstinence from Tobacco advised--May use over-the-counter nicotine patch 2)Complete Abstinence from Alcohol advised--- outpatient alcohol rehab program including AA meetings strongly advised 3)Take Prednisone 4 Tab (40 mg) daily x 4 weeks, then 3 Tabs (30 mg) daily x 5 days, then 2 tab (20mg ) daily x 5 days, then 10 mg (1 tab) daily x 5 days --- Then STOP 4)Follow up with Gastroenterologist Dr. for  repeat EGD-upper endoscopy in 4 weeks for esophageal varices.  5)Repeat CBC, CMET, PT/INR on Monday 04/07/21 6)You will need follow up CT Liver imaging for liver lesions. 7)Follow up with Dr. 14/05/22-- address [address], [place] Number [phone]  8)Avoid ibuprofen/Advil/Aleve/Motrin/Goody Powders/Naproxen/BC powders/Meloxicam/Diclofenac/Indomethacin and other Nonsteroidal anti-inflammatory medications as these will make you more likely to bleed and can cause stomach ulcers, can also cause Kidney problems. 9)You Will need paracentesis procedure--- from time to time to remove fluid from your belly   Increase activity slowly   Complete by: As directed          Discharge Medications     Allergies as of 04/03/2021   No Known Allergies  Medication List     STOP taking these medications    amLODipine 5 MG tablet Commonly known as: NORVASC   fluticasone 50 MCG/ACT nasal spray Commonly known as: FLONASE   traZODone 50 MG tablet Commonly known as: DESYREL       TAKE these medications    folic acid 1 MG tablet Commonly known as: FOLVITE Take 1 tablet (1 mg total) by mouth daily.   furosemide 20 MG tablet Commonly known as: LASIX Take 1 tablet (20 mg total) by mouth daily.   lactulose 10 GM/15ML solution Commonly known as: CHRONULAC Take 15 mLs (10 g total) by mouth 2 (two) times daily.   loratadine 10 MG tablet Commonly known as: CLARITIN Take 10 mg by mouth daily as needed for allergies.   multivitamin with minerals Tabs tablet Take 1 tablet by mouth daily.   nicotine 21 mg/24hr patch Commonly known as: NICODERM CQ - dosed in mg/24 hours Place 1 patch (21 mg total) onto the skin daily.   omeprazole 20 MG tablet Commonly known as: PriLOSEC OTC Take 1 tablet (20 mg total) by mouth 2 (two) times daily.   predniSONE 10 MG tablet Commonly known as: DELTASONE Take 1 tablet (10 mg total) by mouth See admin  instructions. Take Prednisone 4 Tab (40 mg) daily x 4 weeks, then 3 Tabs (30 mg) daily x 5 days, then 2 tab (20mg ) daily x 5 days, then 10 mg (1 tab) daily x 5 days --- Then STOP   spironolactone 50 MG tablet Commonly known as: ALDACTONE Take 1 tablet (50 mg total) by mouth daily.   thiamine 100 MG tablet Take 1 tablet (100 mg total) by mouth daily.        Major procedures and Radiology Reports - PLEASE review detailed and final reports for all details, in brief -   MR ABDOMEN W WO CONTRAST  Result Date: 03/31/2021 CLINICAL DATA:  Characterize possible liver lesions identified by prior CT EXAM: MRI ABDOMEN WITHOUT AND WITH CONTRAST TECHNIQUE: Multiplanar multisequence MR imaging of the abdomen was performed both before and after the administration of intravenous contrast. CONTRAST:  15mL GADAVIST GADOBUTROL 1 MMOL/ML IV SOLN COMPARISON:  CT abdomen pelvis, 03/28/2021 FINDINGS: Lower chest: Small bilateral pleural effusions. Hepatobiliary: Somewhat coarse, nodular contour of the liver. Examination of the liver parenchyma is significantly limited by extensive breath motion and field inhomogeneity artifact. Within this limitation, no mass or suspicious contrast enhancement identified. Subcapsular lesions adjacent to the gallbladder fossa identified by prior CT are not clearly appreciated; the contour of the subcapsular lesion laterally is visible, however there is no intrinsic signal abnormality or differential contrast enhancement to distinguish this from background liver parenchyma (series 13, image 61). Gallbladder wall thickening and pericholecystic fluid, nonspecific in the setting of ascites. No gallstones or biliary ductal dilatation. Pancreas: No mass, inflammatory changes, or other parenchymal abnormality identified. No biliary ductal dilatation. Spleen:  Splenomegaly, maximum coronal span 16.3 cm. Adrenals/Urinary Tract: No masses identified. No evidence of hydronephrosis. Stomach/Bowel:  Visualized portions within the abdomen are unremarkable. Vascular/Lymphatic: No pathologically enlarged lymph nodes identified. No abdominal aortic aneurysm demonstrated. Recanalization of the umbilical vein with abdominal wall varices. Other:  Small volume ascites throughout the abdomen.  Anasarca. Musculoskeletal: No suspicious bone lesions identified. IMPRESSION: 1. Examination of the liver parenchyma is significantly limited by extensive breath motion and field inhomogeneity artifact secondary to ascites. Within this limitation, no mass or suspicious contrast enhancement is clearly identified. 2. Subcapsular lesions adjacent to the gallbladder  fossa identified by prior CT are not clearly appreciated by MR. The contour of the subcapsular lesion laterally is seen, however there is no intrinsic signal abnormality or differential contrast enhancement to distinguish this from background liver parenchyma. These findings are of uncertain significance, particularly within the limitations of this MR examination. Consider follow-up multiphasic contrast enhanced liver protocol CT, generally more robust to breath motion artifact,at 6 months to ensure stability. 3. Gallbladder wall thickening and pericholecystic fluid, nonspecific in the setting of ascites. No gallstones or biliary ductal dilatation. 4. Cirrhosis, splenomegaly, and umbilical varices. 5. Pleural effusions, ascites, and anasarca. Electronically Signed   By: Jearld Lesch M.D.   On: 03/31/2021 10:13   CT Abdomen Pelvis W Contrast  Result Date: 03/28/2021 CLINICAL DATA:  Abdominal distension, nausea, jaundice, alcohol use, increasing jaundice and bowel incontinence, patient reports stopping drinking in October, history hypertension, smoking EXAM: CT ABDOMEN AND PELVIS WITH CONTRAST TECHNIQUE: Multidetector CT imaging of the abdomen and pelvis was performed using the standard protocol following bolus administration of intravenous contrast. CONTRAST:   OMNIPAQUE IOHEXOL 300 MG/ML SOLN IV. No oral contrast. COMPARISON:  None FINDINGS: Lower chest: Minimal bibasilar atelectasis Hepatobiliary: Mildly thickened gallbladder wall, nonspecific in the setting of ascites. Nodular cirrhotic appearing liver. 6 mm low-attenuation focus LEFT lobe liver question tiny cyst. Small subcapsular mass laterally RIGHT lobe liver 2.1 x 1.9 x 2.0 cm. Question additional nodule medially RIGHT lobe liver adjacent to gallbladder fossa, 3.0 x 2.0 x 1.7 cm. No biliary dilatation. Pancreas: Normal appearance Spleen: Normal appearance Adrenals/Urinary Tract: Adrenal glands, kidneys, ureters, and bladder normal appearance Stomach/Bowel: Normal appendix. Mild wall thickening of distal cecum without evidence of appendicitis. Few small bowel loops in the LEFT mid abdomen is of questionable wall thickening versus artifact from underdistention and ascites. Stomach and bowel loops otherwise normal appearance. Question esophageal versus paraesophageal varices. Vascular/Lymphatic: Atherosclerotic calcifications aorta. Anterior abdominal wall collaterals and recannulization of umbilical vein. Vascular structures patent. No adenopathy. Reproductive: Unremarkable prostate gland and seminal vesicles Other: Ascites, predominantly in pelvis.  No free air.  No hernia. Musculoskeletal: Unremarkable IMPRESSION: Cirrhotic appearing liver with ascites, anterior abdominal wall collaterals, and recannulization of umbilical vein. Question 2 RIGHT lobe liver masses 2.1 and 3.0 cm in greatest sizes, indeterminate; further characterization by non emergent MR imaging with and without contrast recommended. Mildly thickened gallbladder wall, nonspecific in the setting of ascites. Few small bowel loops in the LEFT mid abdomen with questionable wall thickening versus artifact from underdistention and ascites. Aortic Atherosclerosis (ICD10-I70.0). Electronically Signed   By: Ulyses Southward M.D.   On: 03/28/2021 16:54   US  Paracentesis  Result Date: 03/31/2021 INDICATION: Jaundice, ascites, cirrhotic appearing liver by CT question alcoholic cirrhosis EXAM: ULTRASOUND GUIDED DIAGNOSTIC AND THERAPEUTIC PARACENTESIS MEDICATIONS: None. COMPLICATIONS: None immediate. PROCEDURE: Informed written consent was obtained from the patient after a discussion of the risks, benefits and alternatives to treatment. A timeout was performed prior to the initiation of the procedure. Initial ultrasound scanning demonstrates a moderate amount of ascites within the right lower abdominal quadrant. The right lower abdomen was prepped and draped in the usual sterile fashion. 1% lidocaine was used for local anesthesia. Following this, a 5 [place] Yueh catheter was introduced. An ultrasound image was saved for documentation purposes. The paracentesis was performed. The catheter was removed and a dressing was applied. The patient tolerated the procedure well without immediate post procedural complication. FINDINGS: A total of approximately 1.5 L of slightly cloudy bright yellow ascitic fluid was  removed. Samples were sent to the laboratory as requested by the clinical team. IMPRESSION: Successful ultrasound-guided paracentesis yielding 1.5 liters of peritoneal fluid. Electronically Signed   By: Ulyses Southward M.D.   On: 03/31/2021 11:00   DG Chest Port 1 View  Result Date: 03/31/2021 CLINICAL DATA:  Shortness of breath, pleural effusions, cirrhosis, ascites EXAM: PORTABLE CHEST 1 VIEW COMPARISON:  Portable exam 1317 hours compared to 08/27/2020 FINDINGS: Upper normal heart size with slight vascular congestion. Mediastinal contours normal. Slight accentuation of interstitial markings, question minimal edema. No gross pleural effusions identified on portable exam. No pneumothorax. Osseous structures unremarkable. IMPRESSION: Slight pulmonary vascular congestion and accentuation of interstitial markings, question minimal pulmonary edema. Electronically Signed    By: Ulyses Southward M.D.   On: 03/31/2021 14:11   US LIVER DOPPLER  Result Date: 03/31/2021 CLINICAL DATA:  Alcoholic cirrhosis EXAM: DUPLEX ULTRASOUND OF LIVER TECHNIQUE: Color and duplex Doppler ultrasound was performed to evaluate the hepatic in-flow and out-flow vessels. Technologist describes technically difficult study secondary to body habitus and overlying bowel gas. COMPARISON:  CT 03/28/2021 FINDINGS: Liver: Heterogenous echotexture.  Nodular contour. The 2 right lobe lesions suspected on recent CT are less conspicuous on this exam. Main Portal Vein size: 1.4 cm Portal Vein Velocities (all hepatopetal): Main Prox:  29 cm/sec Main Mid: 33 cm/sec Main Dist:  37 cm/sec Right: 30 cm/sec Left: 23 cm/sec Hepatic Vein Velocities (all hepatofugal): Right:  48 cm/sec Middle:  36 cm/sec Left:  35 cm/sec IVC: Present and patent with normal respiratory phasicity., 2.7 cm diameter Hepatic Artery Velocity:  129 cm/sec Splenic Vein Velocity:  41 cm/sec Spleen: 10.7 cm x 7.9 cm x 13.9 cm with a total volume of 610 cm^3 (411 cm^3 is upper limit normal) Portal Vein Occlusion/Thrombus: No Splenic Vein Occlusion/Thrombus: No Ascites: Small volume Varices: None identified IMPRESSION: 1. Unremarkable hepatic vascular Doppler evaluation 2. Nodular liver contour suggesting cirrhosis. 3. Small volume abdominal ascites Electronically Signed   By: Corlis Leak M.D.   On: 03/31/2021 07:40    Micro Results   No results found for this or any previous visit (from the past 240 hour(s)).     Today   Subjective    Craig Andrews today has no new complaints -No confusion or encephalopathy\ -Tolerating lactulose well having BMs No fever  Or chills  -No nausea or vomiting          Patient has been seen and examined prior to discharge   Objective   Blood pressure 114/80, pulse 84, temperature 98 F (36.7 C), resp. rate 20, height  (1.803 m), weight 92.8 kg, SpO2 96 %.  No intake or output data in the 24 hours  ending 04/11/21 1521  Exam Gen:- Awake Alert, no acute distress  HEENT:- [place].AT, No sclera icterus Neck-Supple Neck,No JVD,.  Lungs-  CTAB , good air movement bilaterally  CV- S1, S2 normal, regular Abd-  +ve B.Sounds, Abd Soft, No tenderness,    Extremity/Skin:- No  edema,   good pulses Psych-affect is appropriate, oriented x3 Neuro-no new focal deficits, no tremors    Data Review   CBC w Diff:  Lab Results  Component Value Date   WBC 12.2 (H) 04/07/2021   HGB 10.1 (L) 04/07/2021   HCT 30.8 (L) 04/07/2021   PLT 189 04/07/2021   LYMPHOPCT 15 04/02/2021   MONOPCT 8.8 04/07/2021   EOSPCT 1.8 04/07/2021   BASOPCT 0.5 04/07/2021    CMP:  Lab Results  Component Value Date  NA 141 04/07/2021   K 4.0 04/07/2021   CL 107 04/07/2021   CO2 23 04/07/2021   BUN 13 04/07/2021   CREATININE 0.64 04/07/2021   PROT 8.0 04/07/2021   ALBUMIN 3.8 04/03/2021   BILITOT 3.9 (H) 04/07/2021   ALKPHOS 74 04/03/2021   AST 150 (H) 04/07/2021   ALT 103 (H) 04/07/2021  .   Total Discharge time is about 33 minutes  Shon Hale M.D on 04/11/2021 at 3:21 PM  Go to [email or web address] -  for contact info  [facility] - Office  [phone]

## 2021-04-15 ENCOUNTER — Ambulatory Visit (INDEPENDENT_AMBULATORY_CARE_PROVIDER_SITE_OTHER): Payer: Self-pay | Admitting: Gastroenterology

## 2021-04-16 ENCOUNTER — Other Ambulatory Visit: Payer: Self-pay

## 2021-04-16 ENCOUNTER — Ambulatory Visit (HOSPITAL_BASED_OUTPATIENT_CLINIC_OR_DEPARTMENT_OTHER)
Admission: RE | Admit: 2021-04-16 | Discharge: 2021-04-16 | Disposition: A | Discharge status: Home / Self Care | Payer: Self-pay | Source: Ambulatory Visit | Attending: Gastroenterology | Admitting: Gastroenterology

## 2021-04-16 DIAGNOSIS — K769 Liver disease, unspecified: Secondary | ICD-10-CM | POA: Insufficient documentation

## 2021-04-16 DIAGNOSIS — K7031 Alcoholic cirrhosis of liver with ascites: Secondary | ICD-10-CM | POA: Insufficient documentation

## 2021-04-16 MED ORDER — IOHEXOL 300 MG/ML  SOLN
100.0000 mL | Freq: Once | INTRAMUSCULAR | Status: AC | PRN
  Administered 2021-04-16: 11:00:00 75 mL via INTRAVENOUS

## 2021-04-22 NOTE — Patient Instructions (Signed)
Torion Hulgan  04/22/2021     @PREFPERIOPPHARMACY @   Your procedure is scheduled on  04/29/2021.   Report to 05/01/2021 at  [phone]  A.M.   Call this number if you have problems the morning of surgery:  [phone]   Remember:  Follow the diet instructions given to you by the office.    Take these medicines the morning of surgery with A SIP OF WATER                                           omeprazole.     Do not wear jewelry, make-up or nail polish.  Do not wear lotions, powders, or perfumes, or deodorant.  Do not shave 48 hours prior to surgery.  Men may shave face and neck.  Do not bring valuables to the hospital.  [facility] is not responsible for any belongings or valuables.  Contacts, dentures or bridgework may not be worn into surgery.  Leave your suitcase in the car.  After surgery it may be brought to your room.  For patients admitted to the hospital, discharge time will be determined by your treatment team.  Patients discharged the day of surgery will not be allowed to drive home and must have someone with them for 24 hours.    Special instructions:   DO NOT smoke tobacco or vape for 24 hours before your procedure.  Please read over the following fact sheets that you were given. Anesthesia Post-op Instructions and Care and Recovery After Surgery      Upper Endoscopy, Adult, Care After This sheet gives you information about how to care for yourself after your procedure. Your health care provider may also give you more specific instructions. If you have problems or questions, contact your health care provider. What can I expect after the procedure? After the procedure, it is common to have: A sore throat. Mild stomach pain or discomfort. Bloating. Nausea. Follow these instructions at home:  Follow instructions from your health care provider about what to eat or drink after your procedure. Return to your normal activities as told by your health  care provider. Ask your health care provider what activities are safe for you. Take over-the-counter and prescription medicines only as told by your health care provider. If you were given a sedative during the procedure, it can affect you for several hours. Do not drive or operate machinery until your health care provider says that it is safe. Keep all follow-up visits as told by your health care provider. This is important. Contact a health care provider if you have: A sore throat that lasts longer than one day. Trouble swallowing. Get help right away if: You vomit blood or your vomit looks like coffee grounds. You have: A fever. Bloody, black, or tarry stools. A severe sore throat or you cannot swallow. Difficulty breathing. Severe pain in your chest or abdomen. Summary After the procedure, it is common to have a sore throat, mild stomach discomfort, bloating, and nausea. If you were given a sedative during the procedure, it can affect you for several hours. Do not drive or operate machinery until your health care provider says that it is safe. Follow instructions from your health care provider about what to eat or drink after your procedure. Return to your normal activities as told by your health care  provider. This information is not intended to replace advice given to you by your health care provider. Make sure you discuss any questions you have with your health care provider. Document Revised: 02/24/2019 Document Reviewed: 09/20/2017 Elsevier Patient Education  2022 [employer] Monitored Anesthesia Care, Care After This sheet gives you information about how to care for yourself after your procedure. Your health care provider may also give you more specific instructions. If you have problems or questions, contact your health care provider. What can I expect after the procedure? After the procedure, it is common to have: Tiredness. Forgetfulness about what happened after the  procedure. Impaired judgment for important decisions. Nausea or vomiting. Some difficulty with balance. Follow these instructions at home: For the time period you were told by your health care provider:   Rest as needed. Do not participate in activities where you could fall or become injured. Do not drive or use machinery. Do not drink alcohol. Do not take sleeping pills or medicines that cause drowsiness. Do not make important decisions or sign legal documents. Do not take care of children on your own. Eating and drinking Follow the diet that is recommended by your health care provider. Drink enough fluid to keep your urine pale yellow. If you vomit: Drink water, juice, or soup when you can drink without vomiting. Make sure you have little or no nausea before eating solid foods. General instructions Have a responsible adult stay with you for the time you are told. It is important to have someone help care for you until you are awake and alert. Take over-the-counter and prescription medicines only as told by your health care provider. If you have sleep apnea, surgery and certain medicines can increase your risk for breathing problems. Follow instructions from your health care provider about wearing your sleep device: Anytime you are sleeping, including during daytime naps. While taking prescription pain medicines, sleeping medicines, or medicines that make you drowsy. Avoid smoking. Keep all follow-up visits as told by your health care provider. This is important. Contact a health care provider if: You keep feeling nauseous or you keep vomiting. You feel light-headed. You are still sleepy or having trouble with balance after 24 hours. You develop a rash. You have a fever. You have redness or swelling around the IV site. Get help right away if: You have trouble breathing. You have new-onset confusion at home. Summary For several hours after your procedure, you may feel tired.  You may also be forgetful and have poor judgment. Have a responsible adult stay with you for the time you are told. It is important to have someone help care for you until you are awake and alert. Rest as told. Do not drive or operate machinery. Do not drink alcohol or take sleeping pills. Get help right away if you have trouble breathing, or if you suddenly become confused. This information is not intended to replace advice given to you by your health care provider. Make sure you discuss any questions you have with your health care provider. Document Revised: 01/04/2020 Document Reviewed: 03/23/2019 Elsevier Patient Education  2022 [employer].

## 2021-04-23 ENCOUNTER — Encounter (HOSPITAL_COMMUNITY)
Admission: RE | Admit: 2021-04-23 | Discharge: 2021-04-23 | Disposition: A | Discharge status: Home / Self Care | Payer: Self-pay | Source: Ambulatory Visit | Attending: Gastroenterology | Admitting: Gastroenterology

## 2021-04-23 ENCOUNTER — Other Ambulatory Visit: Payer: Self-pay

## 2021-04-23 ENCOUNTER — Encounter (HOSPITAL_COMMUNITY): Payer: Self-pay

## 2021-04-23 DIAGNOSIS — I8500 Esophageal varices without bleeding: Secondary | ICD-10-CM

## 2021-04-29 ENCOUNTER — Ambulatory Visit (HOSPITAL_COMMUNITY): Payer: Self-pay | Admitting: Anesthesiology

## 2021-04-29 ENCOUNTER — Encounter (HOSPITAL_COMMUNITY): Payer: Self-pay | Admitting: Gastroenterology

## 2021-04-29 ENCOUNTER — Ambulatory Visit (HOSPITAL_COMMUNITY)
Admission: RE | Admit: 2021-04-29 | Discharge: 2021-04-29 | Disposition: A | Discharge status: Home / Self Care | Payer: Self-pay | Attending: Gastroenterology | Admitting: Gastroenterology

## 2021-04-29 ENCOUNTER — Other Ambulatory Visit: Payer: Self-pay

## 2021-04-29 ENCOUNTER — Encounter (HOSPITAL_COMMUNITY)
Admission: RE | Disposition: A | Discharge status: Home / Self Care | Payer: Self-pay | Source: Home / Self Care | Attending: Gastroenterology

## 2021-04-29 DIAGNOSIS — K3189 Other diseases of stomach and duodenum: Secondary | ICD-10-CM | POA: Insufficient documentation

## 2021-04-29 DIAGNOSIS — F17290 Nicotine dependence, other tobacco product, uncomplicated: Secondary | ICD-10-CM | POA: Insufficient documentation

## 2021-04-29 DIAGNOSIS — K766 Portal hypertension: Secondary | ICD-10-CM | POA: Insufficient documentation

## 2021-04-29 DIAGNOSIS — I8500 Esophageal varices without bleeding: Secondary | ICD-10-CM

## 2021-04-29 DIAGNOSIS — I8510 Secondary esophageal varices without bleeding: Secondary | ICD-10-CM | POA: Insufficient documentation

## 2021-04-29 DIAGNOSIS — F17210 Nicotine dependence, cigarettes, uncomplicated: Secondary | ICD-10-CM | POA: Insufficient documentation

## 2021-04-29 DIAGNOSIS — I10 Essential (primary) hypertension: Secondary | ICD-10-CM | POA: Insufficient documentation

## 2021-04-29 DIAGNOSIS — K7030 Alcoholic cirrhosis of liver without ascites: Secondary | ICD-10-CM | POA: Insufficient documentation

## 2021-04-29 HISTORY — PX: ESOPHAGOGASTRODUODENOSCOPY (EGD) WITH PROPOFOL: SHX5813

## 2021-04-29 SURGERY — ESOPHAGOGASTRODUODENOSCOPY (EGD) WITH PROPOFOL
Anesthesia: General

## 2021-04-29 MED ORDER — PROPOFOL 10 MG/ML IV BOLUS
INTRAVENOUS | Status: DC | PRN
  Administered 2021-04-29: 200 mg via INTRAVENOUS

## 2021-04-29 MED ORDER — LIDOCAINE HCL (CARDIAC) PF 50 MG/5ML IV SOSY
PREFILLED_SYRINGE | INTRAVENOUS | Status: DC | PRN
  Administered 2021-04-29: 50 mg via INTRAVENOUS

## 2021-04-29 MED ORDER — LACTATED RINGERS IV SOLN: INTRAVENOUS | Status: DC | PRN

## 2021-04-29 NOTE — Anesthesia Postprocedure Evaluation (Signed)
Anesthesia Post Note  Patient: Craig Andrews  Procedure(s) Performed: ESOPHAGOGASTRODUODENOSCOPY (EGD) WITH PROPOFOL  Patient location during evaluation: Phase II Anesthesia Type: General Level of consciousness: awake Pain management: pain level controlled Vital Signs Assessment: post-procedure vital signs reviewed and stable Respiratory status: spontaneous breathing and respiratory function stable Cardiovascular status: blood pressure returned to baseline and stable Postop Assessment: no headache and no apparent nausea or vomiting Anesthetic complications: no Comments: Late entry   No notable events documented.   Last Vitals:  Vitals:   04/29/21 0757 04/29/21 0759  BP: 103/64 108/62  Pulse: 79   Resp: 15   Temp: 36.6 C   SpO2: 98% 100%    Last Pain:  Vitals:   04/29/21 0759  TempSrc:   PainSc: 0-No pain                 Windell Norfolk

## 2021-04-29 NOTE — Anesthesia Procedure Notes (Signed)
Date/Time: 04/29/2021 7:41 AM Performed by: Franco Nones, CRNA Pre-anesthesia Checklist: Patient identified, Emergency Drugs available, Suction available, Timeout performed and Patient being monitored Patient Re-evaluated:Patient Re-evaluated prior to induction Oxygen Delivery Method: Nasal Cannula

## 2021-04-29 NOTE — Transfer of Care (Signed)
Immediate Anesthesia Transfer of Care Note  Patient: Craig Andrews  Procedure(s) Performed: ESOPHAGOGASTRODUODENOSCOPY (EGD) WITH PROPOFOL  Patient Location: Short Stay  Anesthesia Type:General  Level of Consciousness: sedated  Airway & Oxygen Therapy: Patient Spontanous Breathing  Post-op Assessment: Report given to RN and Post -op Vital signs reviewed and stable  Post vital signs: Reviewed and stable  Last Vitals:  Vitals Value Taken Time  BP    Temp    Pulse    Resp    SpO2      Last Pain:  Vitals:   04/29/21 0744  TempSrc:   PainSc: 0-No pain         Complications: No notable events documented.

## 2021-04-29 NOTE — H&P (Signed)
Craig Andrews is an 40 y.o. male.   Chief Complaint: esophageal varices f/u HPI: 40 year old male with past medical history of hypertension and alcoholic cirrhosis, coming for follow-up of esophageal varices.  The patient was hospitalized on 03/28/2021 after presenting alcoholic hepatitis.  He underwent an EGD on 04/01/2021 was found to have esophageal varices which were banded.  He is coming for follow-up of esophageal varices.  He denies any melena, hematochezia, hematemesis, abdominal pain, abdominal distention, fever or chills.  Past Medical History:  Diagnosis Date   Allergy    Hypertension     Past Surgical History:  Procedure Laterality Date   ESOPHAGEAL BANDING  04/01/2021   Procedure: ESOPHAGEAL BANDING;  Surgeon: Marguerita Merles, Reuel Boom, MD;  Location: [facility];  Service: Gastroenterology;;   ESOPHAGOGASTRODUODENOSCOPY (EGD) WITH PROPOFOL N/A 04/01/2021   Procedure: ESOPHAGOGASTRODUODENOSCOPY (EGD) WITH PROPOFOL;  Surgeon: Dolores Frame, MD;  Location: [facility];  Service: Gastroenterology;  Laterality: N/A;   FRACTURE SURGERY     rod inside leg      History reviewed. No pertinent family history. Social History:  reports that he has been smoking cigarettes and cigars. He has been smoking an average of .5 packs per day. He has never used smokeless tobacco. He reports current alcohol use. He reports current drug use. Frequency: 2.00 times per week. Drug: Marijuana.  Allergies: No Known Allergies  Medications Prior to Admission  Medication Sig Dispense Refill   acetaminophen (TYLENOL) 500 MG tablet Take 1,000 mg by mouth every 6 (six) hours as needed for mild pain.     folic acid (FOLVITE) 1 MG tablet Take 1 tablet (1 mg total) by mouth daily. (Patient taking differently: Take 1 mg by mouth every morning.) 30 tablet 3   furosemide (LASIX) 20 MG tablet Take 1 tablet (20 mg total) by mouth daily. (Patient taking differently: Take 20 mg by mouth every  morning.) 30 tablet 3   lactulose (CHRONULAC) 10 GM/15ML solution Take 15 mLs (10 g total) by mouth 2 (two) times daily. 237 mL 3   loratadine (CLARITIN) 10 MG tablet Take 10 mg by mouth daily as needed for allergies.     Multiple Vitamin (MULTIVITAMIN WITH MINERALS) TABS tablet Take 1 tablet by mouth daily.     omeprazole (PRILOSEC OTC) 20 MG tablet Take 1 tablet (20 mg total) by mouth 2 (two) times daily. (Patient taking differently: Take 20 mg by mouth 2 (two) times daily. Taking Rx  not OTC) 60 tablet 2   predniSONE (DELTASONE) 10 MG tablet Take 1 tablet (10 mg total) by mouth See admin instructions. Take Prednisone 4 Tab (40 mg) daily x 4 weeks, then 3 Tabs (30 mg) daily x 5 days, then 2 tab (20mg ) daily x 5 days, then 10 mg (1 tab) daily x 5 days --- Then STOP 120 tablet 1   spironolactone (ALDACTONE) 50 MG tablet Take 1 tablet (50 mg total) by mouth daily. (Patient taking differently: Take 50 mg by mouth in the morning.) 30 tablet 3   thiamine 100 MG tablet Take 1 tablet (100 mg total) by mouth daily. 30 tablet 3   nicotine (NICODERM CQ - DOSED IN MG/24 HOURS) 21 mg/24hr patch Place 1 patch (21 mg total) onto the skin daily. (Patient not taking: Reported on 04/16/2021) 28 patch 0    No results found for this or any previous visit (from the past 48 hour(s)). No results found.  Review of Systems  Constitutional: Negative.   HENT: Negative.  Eyes: Negative.   Respiratory: Negative.    Cardiovascular: Negative.   Gastrointestinal: Negative.   Endocrine: Negative.   Genitourinary: Negative.   Musculoskeletal: Negative.   Skin: Negative.   Allergic/Immunologic: Negative.   Neurological: Negative.   Hematological: Negative.   Psychiatric/Behavioral: Negative.     Blood pressure 127/86, pulse 70, temperature 98 F (36.7 C), temperature source Oral, resp. rate 16, SpO2 99 %. Physical Exam  GENERAL: The patient is AO x3, in no acute distress. HEENT: Head is normocephalic and  atraumatic. EOMI are intact. Mouth is well hydrated and without lesions. NECK: Supple. No masses LUNGS: Clear to auscultation. No presence of rhonchi/wheezing/rales. Adequate chest expansion HEART: RRR, normal s1 and s2. ABDOMEN: Soft, nontender, no guarding, no peritoneal signs, and nondistended. BS +. No masses. EXTREMITIES: Without any cyanosis, clubbing, rash, lesions or edema. NEUROLOGIC: AOx3, no focal motor deficit. SKIN: no jaundice, no rashes  Assessment/Plan 40 year old male with past medical history of hypertension and alcoholic cirrhosis, coming for follow-up of esophageal varices.  We will proceed with EGD.  Dolores Frame, MD 04/29/2021, 7:36 AM

## 2021-04-29 NOTE — Discharge Instructions (Signed)
You are being discharged to home.  Resume your previous diet.  Your physician has recommended a repeat upper endoscopy in six months for surveillance - needs to be on liquid diet 2 days before procedure.

## 2021-04-29 NOTE — Op Note (Signed)
[facility] Patient Name: Craig Andrews Procedure Date: 04/29/2021 7:10 AM MRN: [id number] Date of Birth: 07-Dec-1980 Attending MD: Katrinka Blazing ,  CSN: [id number] Age: 40 Admit Type: Outpatient Procedure:                Upper GI endoscopy Indications:              Follow-up of esophageal varices Providers:                Katrinka Blazing, Angelica Ran, Burke Keels,                            Technician Referring MD:              Medicines:                Monitored Anesthesia Care Complications:            No immediate complications. Estimated Blood Loss:     Estimated blood loss: none. Procedure:                Pre-Anesthesia Assessment:                           - Prior to the procedure, a History and Physical                            was performed, and patient medications, allergies                            and sensitivities were reviewed. The patient's                            tolerance of previous anesthesia was reviewed.                           - The risks and benefits of the procedure and the                            sedation options and risks were discussed with the                            patient. All questions were answered and informed                            consent was obtained.                           After obtaining informed consent, the endoscope was                            passed under direct vision. Throughout the                            procedure, the patient's blood pressure, pulse, and                            oxygen saturations were monitored continuously. The  [id number]-H190 ([id number]) scope was introduced through the                            mouth, and advanced to the second part of duodenum.                            The upper GI endoscopy was performed with                            difficulty due to presence of food. The patient                            tolerated the procedure well. Scope In:  7:47:29 AM Scope Out: 7:49:39 AM Total Procedure Duration: 0 hours 2 minutes 10 seconds  Findings:      Grade I varices were found in the mid esophagus and in the distal       esophagus.      Mild portal hypertensive gastropathy was found in the stomach.      A large amount of food (residue) was found in the entire examined       stomach.      The examined duodenum was normal. Impression:               - Grade I esophageal varices.                           - Portal hypertensive gastropathy.                           - A large amount of food (residue) in the stomach.                           - Normal examined duodenum.                           - No specimens collected. Moderate Sedation:      Per Anesthesia Care Recommendation:           - Discharge patient to home (ambulatory).                           - Resume previous diet.                           - Repeat upper endoscopy in 6 months for                            surveillance - needs to be on liquid diet 2 days                            before procedure. Procedure Code(s):        --- Professional ---                           [phone], Esophagogastroduodenoscopy, flexible,  transoral; diagnostic, including collection of                            specimen(s) by brushing or washing, when performed                            (separate procedure) Diagnosis Code(s):        --- Professional ---                           I85.00, Esophageal varices without bleeding                           K76.6, Portal hypertension                           K31.89, Other diseases of stomach and duodenum CPT copyright 2019 American Medical Association. All rights reserved. The codes documented in this report are preliminary and upon coder review may  be revised to meet current compliance requirements. Katrinka Blazing, MD Katrinka Blazing,  04/29/2021 7:57:23 AM This report has been signed electronically. Number of  Addenda: 0

## 2021-04-29 NOTE — Anesthesia Preprocedure Evaluation (Signed)
Anesthesia Evaluation  Patient identified by MRN, date of birth, ID band Patient awake    Reviewed: Allergy & Precautions, H&P , NPO status , Patient's Chart, lab work & pertinent test results, reviewed documented beta blocker date and time   Airway Mallampati: II  TM Distance: >3 FB Neck ROM: full    Dental no notable dental hx.    Pulmonary neg pulmonary ROS, Current Smoker and Patient abstained from smoking.,    Pulmonary exam normal breath sounds clear to auscultation       Cardiovascular Exercise Tolerance: Good hypertension, negative cardio ROS   Rhythm:regular Rate:Normal     Neuro/Psych PSYCHIATRIC DISORDERS negative neurological ROS     GI/Hepatic negative GI ROS, (+)     substance abuse  alcohol use, Hepatitis -, Toxin Related  Endo/Other  negative endocrine ROS  Renal/GU negative Renal ROS  negative genitourinary   Musculoskeletal   Abdominal   Peds  Hematology  (+) Blood dyscrasia, anemia ,   Anesthesia Other Findings   Reproductive/Obstetrics negative OB ROS                             Anesthesia Physical  Anesthesia Plan  ASA: 3 and emergent  Anesthesia Plan: General   Post-op Pain Management:    Induction:   PONV Risk Score and Plan: Propofol infusion  Airway Management Planned:   Additional Equipment:   Intra-op Plan:   Post-operative Plan:   Informed Consent: I have reviewed the patients History and Physical, chart, labs and discussed the procedure including the risks, benefits and alternatives for the proposed anesthesia with the patient or authorized representative who has indicated his/her understanding and acceptance.     Dental Advisory Given  Plan Discussed with: CRNA  Anesthesia Plan Comments:         Anesthesia Quick Evaluation

## 2021-05-01 ENCOUNTER — Telehealth (INDEPENDENT_AMBULATORY_CARE_PROVIDER_SITE_OTHER): Payer: Self-pay | Admitting: *Deleted

## 2021-05-02 ENCOUNTER — Encounter (HOSPITAL_COMMUNITY): Payer: Self-pay | Admitting: Gastroenterology

## 2021-05-06 ENCOUNTER — Ambulatory Visit (INDEPENDENT_AMBULATORY_CARE_PROVIDER_SITE_OTHER): Payer: Self-pay | Admitting: Gastroenterology

## 2021-05-06 ENCOUNTER — Other Ambulatory Visit: Payer: Self-pay

## 2021-05-06 ENCOUNTER — Encounter (INDEPENDENT_AMBULATORY_CARE_PROVIDER_SITE_OTHER): Payer: Self-pay | Admitting: Gastroenterology

## 2021-05-06 VITALS — BP 127/87 | HR 103 | Temp 98.9°F | Ht 71.0 in | Wt 195.0 lb

## 2021-05-06 DIAGNOSIS — K7030 Alcoholic cirrhosis of liver without ascites: Secondary | ICD-10-CM

## 2021-05-06 DIAGNOSIS — K7010 Alcoholic hepatitis without ascites: Secondary | ICD-10-CM

## 2021-05-06 MED ORDER — NADOLOL 20 MG PO TABS: 20.0000 mg | ORAL_TABLET | Freq: Every day | ORAL | 3 refills | Status: DC

## 2021-05-06 MED ORDER — LACTULOSE 10 GM/15ML PO SOLN: 10.0000 g | Freq: Two times a day (BID) | ORAL | 7 refills | Status: DC

## 2021-05-06 NOTE — Patient Instructions (Addendum)
We will recheck some labs today. Please continue your prednisone taper as prescribed Please continue your lasix 20 mg daily and spironolactone 50mg  daily Please continue your lactulose 47ml twice a day I am starting you on nadalol 20mg  once daily for your esophageal varices  Please continue to avoid alcohol, this is imperative Please let me know if you have any swelling to your abdomen, yellowing of your skin or eyes, episodes of confusion or other GI symptoms.  Follow up in 3 months

## 2021-05-06 NOTE — Progress Notes (Signed)
Referring Provider: No ref. provider found Primary Care Physician:  Elenora GammaBradshaw, Samuel L, MD (Inactive) Primary GI Physician: Levon Hedgerastaneda  Chief Complaint  Patient presents with   Follow-up    Patient here today for a  follow up visit. Patient states he has a medication that is causing foul smelling gas. He is not sure of which medication that is causing this.    HPI:  Craig Andrews is a 41 y.o. male with past medical history of alcohol abuse and HTN.  Patient presenting today for hospital follow up after hospitalization on 03/28/21 for alcoholic hepatitis after onset of jaundice and abdominal distention.   US Para on 03/31/21 with 1.5L yielded, initially there was concern for SBP given presence of gram positive cocci though cell count with PMNs less than 250, [facility] without presence of organisms, indicating initial positive culture was due to contamination, Initial DF was 30, however, it continued to rise during admission and he was started on prednisolone.patient was started on steroids on 04/02/21.  Notably, patient had evidence of cirrhosis on CT A/P during admission as well as 2 lesions on liver, he underwent MRI thereafter but imaging was limited due to movement, CT Liver [place] w/wo contrast on 04/16/21 without findings of any liver lesions but cirrhosis with portal htn.  IDA with FOBT negative during admission as well, with reported melena prior to hospitalization, but none since October. He received 3 units PRBCs during admission with lowest hgb 6.6. He underwent EGD on 04/01/21 with esophageal varices present s/p banding. Repeat EGD on 04/29/21 - Grade I esophageal varices, portal hypertensive gastropathy.  Labs on 04/07/21 with improvement of INR and total bilirubin but aminotransferases were higher than in the past. Lille score was 0.01, which corresponds to a responder to steroids.  he was advised to continue the prednisone for 4 weeks with taper.  CBC  showed mild leukocytosis of 12.2, likely due to prednisone but improving with hemoglobin up to 10.1 and platelets up to 189k.   Patient states he is doing f well since discharge. He is still on his prednisone taper at 15mg  daily. His appetite has been good, he has had no weight loss, he has not had any alcohol since discharge from the hospital and feels he is managing well avoiding drinking. No melena or rectal bleeding. He has not noticed any swelling to his abdomen or his ankles. He denies any episodes of confusion. He states that he ran out of his lactulose but is having 1-2 BMs per day. He continues to take lasix 20mg  daily and aldactone 50mg  daily.  Previous MELD: 15 (04/07/21)  Cirrhosis related questions: Episodes of confusion/disorientation: no Taking diuretics?yes Beta blockers? no Prior history of variceal banding?yes Prior episodes of SBP?no Last liver imaging:December 2022 Alcohol ZOX:WRUEuse:none currently  Last Colonoscopy:never  Last Endoscopy:04/29/21- Grade I esophageal varices. - Portal hypertensive gastropathy. - A large amount of food (residue) in the stomach. - Normal examined duodenum. - No specimens collected.  Recommendations:  Repeat EGD in 6 months  Past Medical History:  Diagnosis Date   Allergy    Hypertension     Past Surgical History:  Procedure Laterality Date   ESOPHAGEAL BANDING  04/01/2021   Procedure: ESOPHAGEAL BANDING;  Surgeon: Marguerita Merlesastaneda Mayorga, Reuel Boomaniel, MD;  [facility];  Service: Gastroenterology;;   ESOPHAGOGASTRODUODENOSCOPY (EGD) WITH PROPOFOL N/A 04/01/2021   Procedure: ESOPHAGOGASTRODUODENOSCOPY (EGD) WITH PROPOFOL;  Surgeon: Dolores Frameastaneda Mayorga, Daniel, MD;  [facility];  Service: Gastroenterology;  Laterality: N/A;   ESOPHAGOGASTRODUODENOSCOPY (EGD) WITH PROPOFOL N/A 04/29/2021   Procedure: ESOPHAGOGASTRODUODENOSCOPY (EGD) WITH PROPOFOL;  Surgeon: Dolores Frame, MD;  [facility];  Service:  Gastroenterology;  Laterality: N/A;  8:05   FRACTURE SURGERY     rod inside leg      Current Outpatient Medications  Medication Sig Dispense Refill   acetaminophen (TYLENOL) 500 MG tablet Take 1,000 mg by mouth every 6 (six) hours as needed for mild pain.     folic acid (FOLVITE) 1 MG tablet Take 1 tablet (1 mg total) by mouth daily. (Patient taking differently: Take 1 mg by mouth every morning.) 30 tablet 3   furosemide (LASIX) 20 MG tablet Take 1 tablet (20 mg total) by mouth daily. (Patient taking differently: Take 20 mg by mouth every morning.) 30 tablet 3   loratadine (CLARITIN) 10 MG tablet Take 10 mg by mouth daily as needed for allergies.     Multiple Vitamin (MULTIVITAMIN WITH MINERALS) TABS tablet Take 1 tablet by mouth daily.     omeprazole (PRILOSEC OTC) 20 MG tablet Take 1 tablet (20 mg total) by mouth 2 (two) times daily. (Patient taking differently: Take 20 mg by mouth 2 (two) times daily. Taking Rx  not OTC) 60 tablet 2   predniSONE (DELTASONE) 10 MG tablet Take 1 tablet (10 mg total) by mouth See admin instructions. Take Prednisone 4 Tab (40 mg) daily x 4 weeks, then 3 Tabs (30 mg) daily x 5 days, then 2 tab (20mg ) daily x 5 days, then 10 mg (1 tab) daily x 5 days --- Then STOP 120 tablet 1   spironolactone (ALDACTONE) 50 MG tablet Take 1 tablet (50 mg total) by mouth daily. (Patient taking differently: Take 50 mg by mouth in the morning.) 30 tablet 3   thiamine 100 MG tablet Take 1 tablet (100 mg total) by mouth daily. 30 tablet 3   lactulose (CHRONULAC) 10 GM/15ML solution Take 15 mLs (10 g total) by mouth 2 (two) times daily. (Patient not taking: Reported on 05/06/2021) 237 mL 3   nicotine (NICODERM CQ - DOSED IN MG/24 HOURS) 21 mg/24hr patch Place 1 patch (21 mg total) onto the skin daily. (Patient not taking: Reported on 04/16/2021) 28 patch 0   No current facility-administered medications for this visit.    Allergies as of 05/06/2021   (No Known Allergies)    History  reviewed. No pertinent family history.  Social History   Socioeconomic History   Marital status: Single    Spouse name: Not on file   Number of children: Not on file   Years of education: Not on file   Highest education level: Not on file  Occupational History   Not on file  Tobacco Use   Smoking status: Every Day    Packs/day: 0.50    Types: Cigarettes, Cigars   Smokeless tobacco: Never  Vaping Use   Vaping Use: Never used  Substance and Sexual Activity   Alcohol use: Not Currently    Comment: Stopped 02/2021   Drug use: Yes    Frequency: 2.0 times per week    Types: Marijuana   Sexual activity: Not on file  Other Topics Concern   Not on file  Social History Narrative   Not on file   Social Determinants of Health   Financial Resource Strain: Not on file  Food Insecurity: Not on file  Transportation Needs: Not on file  Physical Activity: Not on file  Stress: Not on  file  Social Connections: Not on file   Review of systems General: negative for malaise, night sweats, fever, chills, weight loss Neck: Negative for lumps, goiter, pain and significant neck swelling Resp: Negative for cough, wheezing, dyspnea at rest CV: Negative for chest pain, leg swelling, palpitations, orthopnea GI: denies melena, hematochezia, nausea, vomiting, diarrhea, constipation, dysphagia, odyonophagia, early satiety or unintentional weight loss.  MSK: Negative for joint pain or swelling, back pain, and muscle pain. Derm: Negative for itching or rash, or jaundice Psych: Denies depression, anxiety, memory loss, confusion. No homicidal or suicidal ideation.  Heme: Negative for prolonged bleeding, bruising easily, and swollen nodes. Endocrine: Negative for cold or heat intolerance, polyuria, polydipsia and goiter. Neuro: negative for tremor, gait imbalance, syncope and seizures. The remainder of the review of systems is noncontributory.  Physical Exam: BP 127/87 (BP Location: Left Arm, Patient  Position: Sitting, Cuff Size: Large)    Pulse (!) 103    Temp 98.9 F (37.2 C) (Oral)    Ht 5\' 11"  (1.803 m)    Wt 195 lb (88.5 kg)    BMI 27.20 kg/m  General:   Alert and oriented. No distress noted. Pleasant and cooperative.  Head:  Normocephalic and atraumatic. Eyes:  Conjuctiva clear without scleral icterus. Mouth:  Oral mucosa pink and moist. Good dentition. No lesions. Heart: Normal rate and rhythm, s1 and s2 heart sounds present.  Lungs: Clear lung sounds in all lobes. Respirations equal and unlabored. Abdomen:  +BS, soft, non-tender and non-distended. No rebound or guarding. No HSM or masses noted. Derm: No palmar erythema or jaundice Msk:  Symmetrical without gross deformities. Normal posture. Extremities:  Without edema. Neurologic:  Alert and  oriented x4 Psych:  Alert and cooperative. Normal mood and affect.  Invalid input(s): 6 MONTHS   ASSESSMENT: Craig Andrews is a 41 y.o. male presenting today for hospital follow up after admission for alcoholic hepatitis.  Patient doing well, overall today. He is continued on his steroid taper at 15mg  currently. He reports that he has had no alcohol consumption since prior to his hospitalization, which he was congratulated on. He has had no episodes of confusion, no swelling to his abdomen or lower extremities, no jaundice, or easy bruising. He is continued on lasix 20mg  daily, aldactone 50mg  daily and lactulose 33ml BID, though he reports he has no further refills on lactulose. I will refill this as well as start him on a beta blocker given his recent finding of esophageal varices. Notably HR in clinic today was 103, Rx for Nadalol 20mg  daily sent.   Liver imaging and AFP up to date, next liver due in June 2023 and AFP due in May 2023, with continued 6 month MELD labs, which will be repeated today to ensure continued improvement in alcoholic hepatitis.    PLAN:  Continue prednisone taper until completed 2. Continue alcohol cessation-  this is imperative 3. CMP, INR, CBC 4. Continue lasix 20mg  and aldactone 50mg  daily 5. Continue lactulose 54ml BID for 2-3 soft BMs per day 6. Start nadalol 20mg  daily with go HR <60 7. Plan for repeat EGD for esophageal varice monitoring in June 2023 8. 2g sodium diet 9. Avoid NSAIDs  Follow Up: Plan to see patient back for OV in 3 months for continued close following of alcoholic hepatitis recovery, OVs every 6 months thereafter  Verline Kong L. [place], MSN, APRN, AGNP-C Adult-Gerontology Nurse Practitioner [facility]

## 2021-05-07 LAB — COMPREHENSIVE METABOLIC PANEL
AG Ratio: 1.1 (calc) (ref 1.0–2.5)
ALT: 55 U/L — ABNORMAL HIGH (ref 9–46)
AST: 33 U/L (ref 10–40)
Albumin: 3.7 g/dL (ref 3.6–5.1)
Alkaline phosphatase (APISO): 178 U/L — ABNORMAL HIGH (ref 36–130)
BUN: 11 mg/dL (ref 7–25)
CO2: 23 mmol/L (ref 20–32)
Calcium: 8.8 mg/dL (ref 8.6–10.3)
Chloride: 106 mmol/L (ref 98–110)
Creat: 0.61 mg/dL (ref 0.60–1.26)
Globulin: 3.5 g/dL (calc) (ref 1.9–3.7)
Glucose, Bld: 93 mg/dL (ref 65–139)
Potassium: 3.7 mmol/L (ref 3.5–5.3)
Sodium: 140 mmol/L (ref 135–146)
Total Bilirubin: 0.8 mg/dL (ref 0.2–1.2)
Total Protein: 7.2 g/dL (ref 6.1–8.1)

## 2021-05-07 LAB — CBC
HCT: 33.5 % — ABNORMAL LOW (ref 38.5–50.0)
Hemoglobin: 10.7 g/dL — ABNORMAL LOW (ref 13.2–17.1)
MCH: 28.8 pg (ref 27.0–33.0)
MCHC: 31.9 g/dL — ABNORMAL LOW (ref 32.0–36.0)
MCV: 90.3 fL (ref 80.0–100.0)
MPV: 11.1 fL (ref 7.5–12.5)
Platelets: 133 10*3/uL — ABNORMAL LOW (ref 140–400)
RBC: 3.71 10*6/uL — ABNORMAL LOW (ref 4.20–5.80)
RDW: 15.2 % — ABNORMAL HIGH (ref 11.0–15.0)
WBC: 10.6 10*3/uL (ref 3.8–10.8)

## 2021-05-07 LAB — PROTIME-INR
INR: 1
Prothrombin Time: 10.2 s (ref 9.0–11.5)

## 2021-05-15 NOTE — Telephone Encounter (Signed)
error 

## 2021-08-04 ENCOUNTER — Ambulatory Visit (INDEPENDENT_AMBULATORY_CARE_PROVIDER_SITE_OTHER): Payer: Self-pay | Admitting: Gastroenterology

## 2021-08-04 ENCOUNTER — Encounter (INDEPENDENT_AMBULATORY_CARE_PROVIDER_SITE_OTHER): Payer: Self-pay | Admitting: Gastroenterology

## 2021-08-04 VITALS — BP 109/79 | HR 69 | Temp 98.8°F | Ht 71.0 in | Wt 226.7 lb

## 2021-08-04 DIAGNOSIS — K7030 Alcoholic cirrhosis of liver without ascites: Secondary | ICD-10-CM

## 2021-08-04 DIAGNOSIS — I8510 Secondary esophageal varices without bleeding: Secondary | ICD-10-CM

## 2021-08-04 DIAGNOSIS — D509 Iron deficiency anemia, unspecified: Secondary | ICD-10-CM

## 2021-08-04 MED ORDER — FUROSEMIDE 20 MG PO TABS: 20.0000 mg | ORAL_TABLET | Freq: Every morning | ORAL | 3 refills | Status: DC

## 2021-08-04 MED ORDER — SPIRONOLACTONE 50 MG PO TABS: 50.0000 mg | ORAL_TABLET | Freq: Every day | ORAL | 3 refills | Status: DC

## 2021-08-04 NOTE — Progress Notes (Signed)
? ?Referring Provider: No ref. provider found ?Primary Care Physician:  Elenora Gamma, MD (Inactive) ?Primary GI Physician:  ? ?Chief Complaint  ?Patient presents with  ? Follow-up  ?  3 month follow up on alcholic hepatitis. Having swelling in abdomen. Having concerns about joint pain - knee pain, elbows. Concerns about Nadolol makes him sleepy.   ? ?HPI:   ?Craig Andrews is a 41 y.o. male with past medical history of alcohol abuse, HTN, Cirrhosis, alcoholic hepatitis.  ? ?Patient presenting today for follow up.  ? ?Last seen 05/06/21 for hospital follow up for alcoholic hepatitis.  ?[place] Para on 03/31/21 with 1.5L yielded, initially there was concern for SBP given presence of gram positive cocci though cell count with PMNs less than 250, sample was re-evaluated at [facility] without presence of organisms, indicating initial positive culture was due to contamination, Initial DF was 30, however, it continued to rise during admission and he was started on prednisolone.patient was started on steroids on 04/02/21. Labs on 04/07/21 with improvement of INR and total bilirubin but aminotransferases were higher than in the past. Lille score was 0.01, which corresponds to a responder to steroids.  he was advised to continue the prednisone for 4 weeks with taper.  CBC showed mild leukocytosis of 12.2, likely due to prednisone but improving with hemoglobin up to 10.1 and platelets up to 189k.  ? ?Notably, patient had evidence of cirrhosis on CT A/P during admission as well as 2 lesions on liver, he underwent MRI thereafter but imaging was limited due to movement, CT Liver [place] w/wo contrast on 04/16/21 without findings of any liver lesions but cirrhosis with portal htn. ?  ?IDA with FOBT negative during admission as well, with reported melena prior to hospitalization, but none since October. He received 3 units PRBCs during admission with lowest hgb 6.6 with iron 27, TIBC 311, Saturation 9%. He underwent EGD on 04/01/21  with esophageal varices present s/p banding. Repeat EGD on 04/29/21 - Grade I esophageal varices, portal hypertensive gastropathy. ? ?At last visit in January, patient was doing well since d/c, remained on prednisone taper at 15mg  daily, appetite was good, denied weight loss or alcohol since before admission. Denied rectal bleeding or melena. Reported no swelling to abdomen or LEs, no episodes of confusion, ran out of lactulose prior to visit but was having 1-2 BMs per day, continued on lasix 20mg  daily and spironolactone 50mg  daily. Lactulose refilled and patient started on nadolol 20mg  daily for hx of varices. Liver imaging and AFP up to date with next due June 2023 and AFP may 2023. MELD labs and INR updated at that time. Previous MELD in December 2022 was 15. Notably  hgb remained low in January at 10.7 with MCV 90.3  ?  ?Today, States that he has noticed some possible swelling to his abdomen, denies any swelling to LEs, he has gained about 31 pounds since last visit. Reports compliance with his medications. Denies any issues with confusion, having 1-2 BMs per day, no rectal bleeding or melena. No nausea or vomiting. Denies issues with constipation or diarrhea. Does endorse feeling tired at times, uncertain if nadolol is causing this. No dizziness or lightheadedness. Has not had any alcohol since October. Did finish his prednisone taper after last visit. States that his appetite is good. Overall he is feeling well. HR is 69 with BP 109/79 on Nadolol 20mg  daily.  ? ?Notably, he has never had a colonoscopy, he is amenable to this, given  his history of IDA. ? ?MELD (January 2023, 6) ? ?Cirrhosis related questions: ?Episodes of confusion/disorientation: no ?Taking diuretics?yes ?Beta blockers? yes ?Prior history of variceal banding?yes ?Prior episodes of SBP?no ?Last liver imaging: 04/16/21 ?Alcohol ZOX:WRUEuse:none since october ? ?Last Colonoscopy:never ?Last Endoscopy:Grade I esophageal varices. ?- Portal hypertensive  gastropathy. ?- A large amount of food (residue) in the stomach. ?- Normal examined duodenum ? ?Recommendations:  ?Repeat EGD June 2023 ? ?Past Medical History:  ?Diagnosis Date  ? Allergy   ? Hypertension   ? ? ?Past Surgical History:  ?Procedure Laterality Date  ? ESOPHAGEAL BANDING  04/01/2021  ? Procedure: ESOPHAGEAL BANDING;  Surgeon: Marguerita Merlesastaneda Mayorga, Reuel Boomaniel, MD;  [facility];  Service: Gastroenterology;;  ? ESOPHAGOGASTRODUODENOSCOPY (EGD) WITH PROPOFOL N/A 04/01/2021  ? Procedure: ESOPHAGOGASTRODUODENOSCOPY (EGD) WITH PROPOFOL;  Surgeon: Dolores Frameastaneda Mayorga, Daniel, MD;  [facility];  Service: Gastroenterology;  Laterality: N/A;  ? ESOPHAGOGASTRODUODENOSCOPY (EGD) WITH PROPOFOL N/A 04/29/2021  ? Procedure: ESOPHAGOGASTRODUODENOSCOPY (EGD) WITH PROPOFOL;  Surgeon: Dolores Frameastaneda Mayorga, Daniel, MD;  [facility];  Service: Gastroenterology;  Laterality: N/A;  8:05  ? FRACTURE SURGERY    ? rod inside leg    ? ? ?Current Outpatient Medications  ?Medication Sig Dispense Refill  ? acetaminophen (TYLENOL) 500 MG tablet Take 1,000 mg by mouth every 6 (six) hours as needed for mild pain.    ? cetirizine (ZYRTEC) 10 MG tablet Take 10 mg by mouth daily.    ? folic acid (FOLVITE) 1 MG tablet Take 1 tablet (1 mg total) by mouth daily. (Patient taking differently: Take 1 mg by mouth every morning.) 30 tablet 3  ? furosemide (LASIX) 20 MG tablet Take 1 tablet (20 mg total) by mouth daily. (Patient taking differently: Take 20 mg by mouth every morning.) 30 tablet 3  ? lactulose (CHRONULAC) 10 GM/15ML solution Take 15 mLs (10 g total) by mouth 2 (two) times daily. 946 mL 7  ? Multiple Vitamin (MULTIVITAMIN WITH MINERALS) TABS tablet Take 1 tablet by mouth daily.    ? nadolol (CORGARD) 20 MG tablet Take 1 tablet (20 mg total) by mouth daily. 90 tablet 3  ? omeprazole (PRILOSEC OTC) 20 MG tablet Take 1 tablet (20 mg total) by mouth 2 (two) times daily. (Patient taking differently: Take 20 mg by  mouth 2 (two) times daily. Taking Rx  not OTC) 60 tablet 2  ? spironolactone (ALDACTONE) 50 MG tablet Take 1 tablet (50 mg total) by mouth daily. (Patient taking differently: Take 50 mg by mouth in the morning.) 30 tablet 3  ? thiamine 100 MG tablet Take 1 tablet (100 mg total) by mouth daily. 30 tablet 3  ? ?No current facility-administered medications for this visit.  ? ? ?Allergies as of 08/04/2021  ? (No Known Allergies)  ? ? ?No family history on file. ? ?Social History  ? ?Socioeconomic History  ? Marital status: Single  ?  Spouse name: Not on file  ? Number of children: Not on file  ? Years of education: Not on file  ? Highest education level: Not on file  ?Occupational History  ? Not on file  ?Tobacco Use  ? Smoking status: Every Day  ?  Packs/day: 0.50  ?  Types: Cigarettes, Cigars  ?  Passive exposure: Current  ? Smokeless tobacco: Never  ?Vaping Use  ? Vaping Use: Never used  ?Substance and Sexual Activity  ? Alcohol use: Not Currently  ?  Comment: Stopped 02/2021  ? Drug use: Yes  ?  Frequency: 2.0 times per week  ?  Types: Marijuana  ? Sexual activity: Not on file  ?Other Topics Concern  ? Not on file  ?Social History Narrative  ? Not on file  ? ?Social Determinants of Health  ? ?Financial Resource Strain: Not on file  ?Food Insecurity: Not on file  ?Transportation Needs: Not on file  ?Physical Activity: Not on file  ?Stress: Not on file  ?Social Connections: Not on file  ? ?Review of systems ?General: negative for malaise, night sweats, fever, chills, weight loss ?Neck: Negative for lumps, goiter, pain and significant neck swelling ?Resp: Negative for cough, wheezing, dyspnea at rest ?CV: Negative for chest pain, leg swelling, palpitations, orthopnea ?GI: denies melena, hematochezia, nausea, vomiting, diarrhea, constipation, dysphagia, odyonophagia, early satiety or unintentional weight loss.  ?MSK: Negative for joint pain or swelling, back pain, and muscle pain. ?Derm: Negative for itching or  rash ?Psych: Denies depression, anxiety, memory loss, confusion. No homicidal or suicidal ideation.  ?Heme: Negative for prolonged bleeding, bruising easily, and swollen nodes. ?Endocrine: Negative for cold o

## 2021-08-04 NOTE — Patient Instructions (Addendum)
I am very proud of how well you are doing! Keep up the good work! Liver function is looking much better ?Please continue your lactulose, lasix, spironolactone and nadolol, I would like for you to increase nadolol to 40mg  daily, please take two of your 20mg  pills, if you are noticing that you are more drowsy or having more side effects from this increase, please let me know. If you tolerate this dose, you can make me aware once you need a refill and I will send the 40mg  tablets for you ? ?We will also recheck blood counts and iron studies as you had significantly low blood counts during admission, if these are still low, will need to further discuss colonoscopy  ? ?If you are noticing more swelling or tightness in your belly, please let me know ? ?-Repeat Liver US due June 2023 ?-Repeat EGD June 2023 ?-Repeat cirrhosis labs due July 2023 ?- Reduce salt intake to <2 g per day ?- Can take Tylenol max of 2 g per day (650 mg q8h) for pain ?- Avoid NSAIDs for pain ?- Avoid eating raw oysters/shellfish ?- Ensure every night before going to sleep ? ?Follow up July 2023 ?

## 2021-08-05 DIAGNOSIS — I8510 Secondary esophageal varices without bleeding: Secondary | ICD-10-CM | POA: Insufficient documentation

## 2021-08-05 DIAGNOSIS — I8500 Esophageal varices without bleeding: Secondary | ICD-10-CM | POA: Insufficient documentation

## 2021-08-05 LAB — CBC
HCT: 44.5 % (ref 38.5–50.0)
Hemoglobin: 14.7 g/dL (ref 13.2–17.1)
MCH: 28.3 pg (ref 27.0–33.0)
MCHC: 33 g/dL (ref 32.0–36.0)
MCV: 85.6 fL (ref 80.0–100.0)
MPV: 11.2 fL (ref 7.5–12.5)
Platelets: 148 10*3/uL (ref 140–400)
RBC: 5.2 10*6/uL (ref 4.20–5.80)
RDW: 18 % — ABNORMAL HIGH (ref 11.0–15.0)
WBC: 8.3 10*3/uL (ref 3.8–10.8)

## 2021-08-05 LAB — IRON,TIBC AND FERRITIN PANEL
%SAT: 58 % (calc) — ABNORMAL HIGH (ref 20–48)
Ferritin: 28 ng/mL — ABNORMAL LOW (ref 38–380)
Iron: 267 ug/dL — ABNORMAL HIGH (ref 50–180)
TIBC: 460 mcg/dL (calc) — ABNORMAL HIGH (ref 250–425)

## 2021-08-06 ENCOUNTER — Other Ambulatory Visit (INDEPENDENT_AMBULATORY_CARE_PROVIDER_SITE_OTHER): Payer: Self-pay

## 2021-08-06 MED ORDER — OMEPRAZOLE MAGNESIUM 20 MG PO TBEC: 20.0000 mg | DELAYED_RELEASE_TABLET | Freq: Two times a day (BID) | ORAL | 2 refills | Status: DC

## 2021-08-06 MED ORDER — FOLIC ACID 1 MG PO TABS: 1.0000 mg | ORAL_TABLET | Freq: Every day | ORAL | 3 refills | Status: DC

## 2021-08-06 MED ORDER — THIAMINE HCL 100 MG PO TABS: 100.0000 mg | ORAL_TABLET | Freq: Every day | ORAL | 3 refills | Status: DC

## 2021-08-06 NOTE — Telephone Encounter (Signed)
Patient asking for refills on  ?Omeprazole 20 mg bid ?Folic Acid 1 mg one po daily ?Thiamine 100 mg one po daily. ?Would like this sent to [facility].  ?I advised the patient he would need to re-established with pcp so that he could have other medications refilled that are not GI related in the future when he runs out of refills. He states understanding.  ?

## 2021-09-16 ENCOUNTER — Encounter (INDEPENDENT_AMBULATORY_CARE_PROVIDER_SITE_OTHER): Payer: Self-pay

## 2021-09-16 ENCOUNTER — Other Ambulatory Visit (INDEPENDENT_AMBULATORY_CARE_PROVIDER_SITE_OTHER): Payer: Self-pay

## 2021-09-16 DIAGNOSIS — I8510 Secondary esophageal varices without bleeding: Secondary | ICD-10-CM

## 2021-10-21 NOTE — Patient Instructions (Signed)
Craig Andrews  10/21/2021     @PREFPERIOPPHARMACY @   Your procedure is scheduled on  10/24/2021.   Report to [facility] at  0900 A.M.   Call this number if you have problems the morning of surgery:  [phone]   Remember:  Follow the diet and prep instructions given to you by the office.    Take these medicines the morning of surgery with A SIP OF WATER                            corgard, omeprazole.     Do not wear jewelry, make-up or nail polish.  Do not wear lotions, powders, or perfumes, or deodorant.  Do not shave 48 hours prior to surgery.  Men may shave face and neck.  Do not bring valuables to the hospital.  [facility] is not responsible for any belongings or valuables.  Contacts, dentures or bridgework may not be worn into surgery.  Leave your suitcase in the car.  After surgery it may be brought to your room.  For patients admitted to the hospital, discharge time will be determined by your treatment team.  Patients discharged the day of surgery will not be allowed to drive home and must have someone with them for 24 hours.    Special instructions:   DO NOT smoke tobacco or vape for 24 hours before your procedure.  Please read over the following fact sheets that you were given. Anesthesia Post-op Instructions and Care and Recovery After Surgery      Upper Endoscopy, Adult, Care After This sheet gives you information about how to care for yourself after your procedure. Your health care provider may also give you more specific instructions. If you have problems or questions, contact your health care provider. What can I expect after the procedure? After the procedure, it is common to have: A sore throat. Mild stomach pain or discomfort. Bloating. Nausea. Follow these instructions at home:  Follow instructions from your health care provider about what to eat or drink after your procedure. Return to your normal activities as told by your health  care provider. Ask your health care provider what activities are safe for you. Take over-the-counter and prescription medicines only as told by your health care provider. If you were given a sedative during the procedure, it can affect you for several hours. Do not drive or operate machinery until your health care provider says that it is safe. Keep all follow-up visits as told by your health care provider. This is important. Contact a health care provider if you have: A sore throat that lasts longer than one day. Trouble swallowing. Get help right away if: You vomit blood or your vomit looks like coffee grounds. You have: A fever. Bloody, black, or tarry stools. A severe sore throat or you cannot swallow. Difficulty breathing. Severe pain in your chest or abdomen. Summary After the procedure, it is common to have a sore throat, mild stomach discomfort, bloating, and nausea. If you were given a sedative during the procedure, it can affect you for several hours. Do not drive or operate machinery until your health care provider says that it is safe. Follow instructions from your health care provider about what to eat or drink after your procedure. Return to your normal activities as told by your health care provider. This information is not intended to replace advice given to you by your  health care provider. Make sure you discuss any questions you have with your health care provider. Document Revised: 02/24/2019 Document Reviewed: 09/20/2017 Elsevier Patient Education  2023 Else[employer] Monitored Anesthesia Care, Care After This sheet gives you information about how to care for yourself after your procedure. Your health care provider may also give you more specific instructions. If you have problems or questions, contact your health care provider. What can I expect after the procedure? After the procedure, it is common to have: Tiredness. Forgetfulness about what happened after the  procedure. Impaired judgment for important decisions. Nausea or vomiting. Some difficulty with balance. Follow these instructions at home: For the time period you were told by your health care provider:     Rest as needed. Do not participate in activities where you could fall or become injured. Do not drive or use machinery. Do not drink alcohol. Do not take sleeping pills or medicines that cause drowsiness. Do not make important decisions or sign legal documents. Do not take care of children on your own. Eating and drinking Follow the diet that is recommended by your health care provider. Drink enough fluid to keep your urine pale yellow. If you vomit: Drink water, juice, or soup when you can drink without vomiting. Make sure you have little or no nausea before eating solid foods. General instructions Have a responsible adult stay with you for the time you are told. It is important to have someone help care for you until you are awake and alert. Take over-the-counter and prescription medicines only as told by your health care provider. If you have sleep apnea, surgery and certain medicines can increase your risk for breathing problems. Follow instructions from your health care provider about wearing your sleep device: Anytime you are sleeping, including during daytime naps. While taking prescription pain medicines, sleeping medicines, or medicines that make you drowsy. Avoid smoking. Keep all follow-up visits as told by your health care provider. This is important. Contact a health care provider if: You keep feeling nauseous or you keep vomiting. You feel light-headed. You are still sleepy or having trouble with balance after 24 hours. You develop a rash. You have a fever. You have redness or swelling around the IV site. Get help right away if: You have trouble breathing. You have new-onset confusion at home. Summary For several hours after your procedure, you may feel  tired. You may also be forgetful and have poor judgment. Have a responsible adult stay with you for the time you are told. It is important to have someone help care for you until you are awake and alert. Rest as told. Do not drive or operate machinery. Do not drink alcohol or take sleeping pills. Get help right away if you have trouble breathing, or if you suddenly become confused. This information is not intended to replace advice given to you by your health care provider. Make sure you discuss any questions you have with your health care provider. Document Revised: 03/25/2021 Document Reviewed: 03/23/2019 Elsevier Patient Education  2023 [employer].

## 2021-10-22 ENCOUNTER — Encounter (HOSPITAL_COMMUNITY)
Admission: RE | Admit: 2021-10-22 | Discharge: 2021-10-22 | Disposition: A | Discharge status: Home / Self Care | Payer: Self-pay | Source: Ambulatory Visit | Attending: Gastroenterology | Admitting: Gastroenterology

## 2021-10-22 DIAGNOSIS — Z01812 Encounter for preprocedural laboratory examination: Secondary | ICD-10-CM | POA: Insufficient documentation

## 2021-10-22 DIAGNOSIS — I8510 Secondary esophageal varices without bleeding: Secondary | ICD-10-CM

## 2021-10-22 LAB — BASIC METABOLIC PANEL
Anion gap: 11 (ref 5–15)
BUN: 7 mg/dL (ref 6–20)
CO2: 22 mmol/L (ref 22–32)
Calcium: 9.2 mg/dL (ref 8.9–10.3)
Chloride: 102 mmol/L (ref 98–111)
Creatinine, Ser: 0.66 mg/dL (ref 0.61–1.24)
GFR, Estimated: >60 mL/min (ref >60)
Glucose, Bld: 108 mg/dL — ABNORMAL HIGH (ref 70–99)
Potassium: 3.4 mmol/L — ABNORMAL LOW (ref 3.5–5.1)
Sodium: 135 mmol/L (ref 135–145)

## 2021-10-24 ENCOUNTER — Ambulatory Visit (HOSPITAL_COMMUNITY)
Admission: RE | Admit: 2021-10-24 | Discharge: 2021-10-24 | Disposition: A | Discharge status: Home / Self Care | Payer: Self-pay | Attending: Gastroenterology | Admitting: Gastroenterology

## 2021-10-24 ENCOUNTER — Ambulatory Visit (HOSPITAL_BASED_OUTPATIENT_CLINIC_OR_DEPARTMENT_OTHER): Payer: Self-pay | Admitting: Anesthesiology

## 2021-10-24 ENCOUNTER — Encounter (HOSPITAL_COMMUNITY)
Admission: RE | Disposition: A | Discharge status: Home / Self Care | Payer: Self-pay | Source: Home / Self Care | Attending: Gastroenterology

## 2021-10-24 ENCOUNTER — Ambulatory Visit (HOSPITAL_COMMUNITY): Payer: Self-pay | Admitting: Anesthesiology

## 2021-10-24 DIAGNOSIS — I10 Essential (primary) hypertension: Secondary | ICD-10-CM | POA: Insufficient documentation

## 2021-10-24 DIAGNOSIS — R188 Other ascites: Secondary | ICD-10-CM

## 2021-10-24 DIAGNOSIS — K769 Liver disease, unspecified: Secondary | ICD-10-CM

## 2021-10-24 DIAGNOSIS — J90 Pleural effusion, not elsewhere classified: Secondary | ICD-10-CM

## 2021-10-24 DIAGNOSIS — Z87891 Personal history of nicotine dependence: Secondary | ICD-10-CM

## 2021-10-24 DIAGNOSIS — K7031 Alcoholic cirrhosis of liver with ascites: Secondary | ICD-10-CM

## 2021-10-24 DIAGNOSIS — I8500 Esophageal varices without bleeding: Secondary | ICD-10-CM

## 2021-10-24 DIAGNOSIS — M25561 Pain in right knee: Secondary | ICD-10-CM

## 2021-10-24 DIAGNOSIS — I8510 Secondary esophageal varices without bleeding: Secondary | ICD-10-CM

## 2021-10-24 DIAGNOSIS — K652 Spontaneous bacterial peritonitis: Secondary | ICD-10-CM

## 2021-10-24 DIAGNOSIS — F1290 Cannabis use, unspecified, uncomplicated: Secondary | ICD-10-CM | POA: Insufficient documentation

## 2021-10-24 DIAGNOSIS — F1094 Alcohol use, unspecified with alcohol-induced mood disorder: Secondary | ICD-10-CM

## 2021-10-24 DIAGNOSIS — K3189 Other diseases of stomach and duodenum: Secondary | ICD-10-CM

## 2021-10-24 DIAGNOSIS — K766 Portal hypertension: Secondary | ICD-10-CM | POA: Insufficient documentation

## 2021-10-24 DIAGNOSIS — E876 Hypokalemia: Secondary | ICD-10-CM

## 2021-10-24 DIAGNOSIS — D649 Anemia, unspecified: Secondary | ICD-10-CM

## 2021-10-24 DIAGNOSIS — Z09 Encounter for follow-up examination after completed treatment for conditions other than malignant neoplasm: Secondary | ICD-10-CM | POA: Insufficient documentation

## 2021-10-24 DIAGNOSIS — F17210 Nicotine dependence, cigarettes, uncomplicated: Secondary | ICD-10-CM | POA: Insufficient documentation

## 2021-10-24 DIAGNOSIS — D539 Nutritional anemia, unspecified: Secondary | ICD-10-CM

## 2021-10-24 DIAGNOSIS — R0602 Shortness of breath: Secondary | ICD-10-CM

## 2021-10-24 DIAGNOSIS — K219 Gastro-esophageal reflux disease without esophagitis: Secondary | ICD-10-CM | POA: Insufficient documentation

## 2021-10-24 DIAGNOSIS — K7010 Alcoholic hepatitis without ascites: Secondary | ICD-10-CM

## 2021-10-24 DIAGNOSIS — F17290 Nicotine dependence, other tobacco product, uncomplicated: Secondary | ICD-10-CM | POA: Insufficient documentation

## 2021-10-24 HISTORY — PX: ESOPHAGOGASTRODUODENOSCOPY (EGD) WITH PROPOFOL: SHX5813

## 2021-10-24 SURGERY — ESOPHAGOGASTRODUODENOSCOPY (EGD) WITH PROPOFOL
Anesthesia: General

## 2021-10-24 MED ORDER — NADOLOL 40 MG PO TABS: 40.0000 mg | ORAL_TABLET | Freq: Every day | ORAL | 3 refills | Status: DC

## 2021-10-24 MED ORDER — LIDOCAINE 2% (20 MG/ML) 5 ML SYRINGE
INTRAMUSCULAR | Status: DC | PRN
  Administered 2021-10-24: 60 mg via INTRAVENOUS

## 2021-10-24 MED ORDER — PROPOFOL 10 MG/ML IV BOLUS
INTRAVENOUS | Status: DC | PRN
  Administered 2021-10-24 (×2): 30 mg via INTRAVENOUS
  Administered 2021-10-24: 150 mg via INTRAVENOUS

## 2021-10-24 MED ORDER — LACTATED RINGERS IV SOLN: INTRAVENOUS | Status: DC

## 2021-10-24 NOTE — H&P (Signed)
Craig Andrews is an 41 y.o. male.   Chief Complaint: follow up esophageal varices HPI: 41 year old male with past medical history of hypertension and alcoholic cirrhosis, coming for follow-up of esophageal varices.  The patient denies having any nausea, vomiting, fever, chills, hematochezia, melena, hematemesis, abdominal distention, abdominal pain, diarrhea, jaundice, pruritus or weight loss.  He has been taking although compliantly.  Last EGD in 04/29/2021 had grade 1 esophageal varices.    Past Medical History:  Diagnosis Date   Allergy    Hypertension     Past Surgical History:  Procedure Laterality Date   ESOPHAGEAL BANDING  04/01/2021   Procedure: ESOPHAGEAL BANDING;  Surgeon: Marguerita Merles, Reuel Boom, MD;  [facility];  Service: Gastroenterology;;   ESOPHAGOGASTRODUODENOSCOPY (EGD) WITH PROPOFOL N/A 04/01/2021   Procedure: ESOPHAGOGASTRODUODENOSCOPY (EGD) WITH PROPOFOL;  Surgeon: Dolores Frame, MD;  [facility];  Service: Gastroenterology;  Laterality: N/A;   ESOPHAGOGASTRODUODENOSCOPY (EGD) WITH PROPOFOL N/A 04/29/2021   Procedure: ESOPHAGOGASTRODUODENOSCOPY (EGD) WITH PROPOFOL;  Surgeon: Dolores Frame, MD;  [facility];  Service: Gastroenterology;  Laterality: N/A;  8:05   FRACTURE SURGERY     rod inside leg      No family history on file. Social History:  reports that he has been smoking cigarettes and cigars. He has been smoking an average of .5 packs per day. He has been exposed to tobacco smoke. He has never used smokeless tobacco. He reports that he does not currently use alcohol. He reports current drug use. Frequency: 2.00 times per week. Drug: Marijuana.  Allergies: No Known Allergies  Medications Prior to Admission  Medication Sig Dispense Refill   acetaminophen (TYLENOL) 500 MG tablet Take 1,000 mg by mouth every 6 (six) hours as needed for mild pain.     diphenhydrAMINE (BENADRYL) 25 MG tablet Take  25 mg by mouth every 6 (six) hours as needed for allergies or sleep.     folic acid (FOLVITE) 1 MG tablet Take 1 tablet (1 mg total) by mouth daily. 30 tablet 3   furosemide (LASIX) 20 MG tablet Take 1 tablet (20 mg total) by mouth every morning. 90 tablet 3   lactulose (CHRONULAC) 10 GM/15ML solution Take 15 mLs (10 g total) by mouth 2 (two) times daily. 946 mL 7   Multiple Vitamin (MULTIVITAMIN WITH MINERALS) TABS tablet Take 1 tablet by mouth daily.     nadolol (CORGARD) 20 MG tablet Take 1 tablet (20 mg total) by mouth daily. (Patient taking differently: Take 40 mg by mouth daily.) 90 tablet 3   omeprazole (PRILOSEC OTC) 20 MG tablet Take 1 tablet (20 mg total) by mouth 2 (two) times daily. 60 tablet 2   spironolactone (ALDACTONE) 50 MG tablet Take 1 tablet (50 mg total) by mouth daily. 90 tablet 3   thiamine 100 MG tablet Take 1 tablet (100 mg total) by mouth daily. 30 tablet 3    Results for orders placed or performed during the hospital encounter of 10/22/21 (from the past 48 hour(s))  Basic metabolic panel     Status: Abnormal   Collection Time: 10/22/21  9:20 AM  Result Value Ref Range   Sodium 135 135 - 145 mmol/L   Potassium 3.4 (L) 3.5 - 5.1 mmol/L   Chloride 102 98 - 111 mmol/L   CO2 22 22 - 32 mmol/L   Glucose, Bld 108 (H) 70 - 99 mg/dL    Comment: Glucose reference range applies only to samples taken after fasting for  at least 8 hours.   BUN 7 6 - 20 mg/dL   Creatinine, Ser 1.61 0.61 - 1.24 mg/dL   Calcium 9.2 8.9 - 09.6 mg/dL   GFR, Estimated >04 >54 mL/min    Comment: (NOTE) Calculated using the CKD-EPI Creatinine Equation (2021)    Anion gap 11 5 - 15    Comment: Performed at [facility], [address] [place], [place] [address]   No results found.  Review of Systems  Constitutional: Negative.   HENT: Negative.    Eyes: Negative.   Respiratory: Negative.    Cardiovascular: Negative.   Gastrointestinal: Negative.   Endocrine: Negative.   Genitourinary:  Negative.   Musculoskeletal: Negative.   Skin: Negative.   Allergic/Immunologic: Negative.   Neurological: Negative.   Hematological: Negative.   Psychiatric/Behavioral: Negative.      Blood pressure 98/69, pulse 60, temperature 97.8 F (36.6 C), resp. rate 20, SpO2 96 %. Physical Exam   Assessment/Plan 41 year old male with past medical history of hypertension and alcoholic cirrhosis, coming for follow-up of esophageal varices.  We will proceed with EGD.  Dolores Frame, MD 10/24/2021, 9:14 AM

## 2021-10-24 NOTE — Anesthesia Postprocedure Evaluation (Signed)
Anesthesia Post Note  Patient: Craig Andrews  Procedure(s) Performed: ESOPHAGOGASTRODUODENOSCOPY (EGD) WITH PROPOFOL  Patient location during evaluation: Phase II Anesthesia Type: General Level of consciousness: awake and alert and oriented Pain management: pain level controlled Vital Signs Assessment: post-procedure vital signs reviewed and stable Respiratory status: spontaneous breathing, nonlabored ventilation and respiratory function stable Cardiovascular status: blood pressure returned to baseline and stable Postop Assessment: no apparent nausea or vomiting Anesthetic complications: no   No notable events documented.   Last Vitals:  Vitals:   10/24/21 0900 10/24/21 1054  BP: 98/69 (!) 83/54  Pulse: 60 74  Resp: 20 (!) 25  Temp: 36.6 C (!) 36.4 C  SpO2: 96% 94%    Last Pain:  Vitals:   10/24/21 1054  PainSc: 0-No pain                 Rochelle Nephew C Renarda Mullinix

## 2021-10-30 ENCOUNTER — Encounter (HOSPITAL_COMMUNITY): Payer: Self-pay | Admitting: Gastroenterology

## 2021-11-03 ENCOUNTER — Encounter (INDEPENDENT_AMBULATORY_CARE_PROVIDER_SITE_OTHER): Payer: Self-pay | Admitting: Gastroenterology

## 2021-11-03 ENCOUNTER — Ambulatory Visit (INDEPENDENT_AMBULATORY_CARE_PROVIDER_SITE_OTHER): Payer: Self-pay | Admitting: Gastroenterology

## 2021-11-03 VITALS — BP 114/75 | HR 73 | Temp 99.5°F | Ht 71.0 in | Wt 227.8 lb

## 2021-11-03 DIAGNOSIS — R790 Abnormal level of blood mineral: Secondary | ICD-10-CM

## 2021-11-03 DIAGNOSIS — K7030 Alcoholic cirrhosis of liver without ascites: Secondary | ICD-10-CM

## 2021-11-03 NOTE — Patient Instructions (Signed)
Congrats on your continued sobriety!  Will get RUQ [place] and liver labs updated as part of cirrhosis monitoring  I would also like to recheck iron levels, if still elevated, please stop multivitamin as this contains iron and could be driving your iron levels up continue lactulose 10g twice daily Continue lasix 20mg  and spironolactone 50mg  daily Continue nadolol 40mg  daily  Reduce salt intake to <2 g per day Can take Tylenol max of 2 g per day (650 mg q8h) for pain Avoid NSAIDs for pain Avoid eating raw oysters/shellfish Ensure every night before going to sleep  Follow up 6 months

## 2021-11-03 NOTE — Progress Notes (Signed)
Referring Provider: No ref. provider found Primary Care Physician:  Elenora Gamma, MD (Inactive) Primary GI Physician: Levon Hedger  Chief Complaint  Patient presents with   Cirrhosis    Follow up on alcoholic hepatitis cirrhosis and IDA. States he is doing well and no concerns.    HPI:   Craig Andrews is a 41 y.o. male with past medical history of alcohol abuse, HTN, Cirrhosis, alcoholic hepatitis.   Patient presenting today for follow up of cirrhosis.  History:  Hospitalization in nov 2022 for alcoholic hep, [place] Para on 03/31/21 with 1.5L yielded, initially there was concern for SBP given presence of gram positive cocci though cell count with PMNs less than 250, sample was re-evaluated at [facility] without presence of organisms, indicating initial positive culture was due to contamination, Initial DF was 30, however, it continued to rise during admission and he was started on prednisolone.patient was started on steroids on 04/02/21. Labs on 04/07/21 with improvement of INR and total bilirubin but aminotransferases were higher than in the past. Lille score was 0.01, which corresponds to a responder to steroids.  he was advised to continue the prednisone for 4 weeks with taper.  CBC showed mild leukocytosis of 12.2, likely due to prednisone but improving with hemoglobin up to 10.1 and platelets up to 189k.    evidence of cirrhosis on CT A/P during admission as well as 2 lesions on liver, he underwent MRI thereafter but imaging was limited due to movement, CT Liver Abdomen w/wo contrast on 04/16/21 without findings of any liver lesions but cirrhosis with portal htn.   IDA with FOBT negative during admission as well, with reported melena prior to hospitalization, but none since October. He received 3 units PRBCs during admission with lowest hgb 6.6 with iron 27, TIBC 311, Saturation 9%. He underwent EGD on 04/01/21 with esophageal varices present s/p banding.   Last seen 08/04/21, at that time  patient reported some swelling to abdomen, but none to LEs, gained about 31 lbs since visit in jan 2023, denies confusion, having 1-2 BMs per day with no rectal bleeding or melena. Was feeling somewhat fatigued at times. No alcohol intake since October. Discussed colonoscopy if iron studies showed deficiency.   Repeat CBC after last visit 08/04/21 with hgb 14.7, Iron 267, TIBC 460, saturation 58%, ferritin 28  Iron studies notably low during hospitalization in November.  Advised to continue lasix 20mg , spironolactone 50mg  daily, lactulose 10g BID, nadolol 40mg  daily,  Repeat AFP due May 2023, Repepat due June 2023, MELD and INR due July 2023  Present: Patient states he is doing well today. No etoh since October. Denies confusion, swelling to abdomen or LEs. No pruritus or changes in skin color. Denies rectal bleeding or melena. Weight stable, appetite is good. Reports compliance with nadolol, diuretics and lactulose without any known side effects.Having a BM atleast once daily. Daughter present at visit confirms no episodes of confusion.   Previous MELD:6-January 2023  Cirrhosis related questions: Episodes of confusion/disorientation: no Taking diuretics?yes Beta blockers? yes Prior history of variceal banding? yes Prior episodes of SBP?no Last liver imaging:04/16/21 Alcohol use: sober since october  Last Colonoscopy:never Last Endoscopy 6/23/23Grade I and grade II esophageal varices. - Portal hypertensive gastropathy. - Normal examined duodenum. - No specimens collected.   Recommendations:  Repeat egd June 2024  Past Medical History:  Diagnosis Date   Allergy    Hypertension     Past Surgical History:  Procedure Laterality Date   ESOPHAGEAL  BANDING  04/01/2021   Procedure: ESOPHAGEAL BANDING;  Surgeon: Marguerita Merles, Reuel Boom, MD;  [facility];  Service: Gastroenterology;;   ESOPHAGOGASTRODUODENOSCOPY (EGD) WITH PROPOFOL N/A 04/01/2021   Procedure:  ESOPHAGOGASTRODUODENOSCOPY (EGD) WITH PROPOFOL;  Surgeon: Dolores Frame, MD;  [facility];  Service: Gastroenterology;  Laterality: N/A;   ESOPHAGOGASTRODUODENOSCOPY (EGD) WITH PROPOFOL N/A 04/29/2021   Procedure: ESOPHAGOGASTRODUODENOSCOPY (EGD) WITH PROPOFOL;  Surgeon: Dolores Frame, MD;  [facility];  Service: Gastroenterology;  Laterality: N/A;  8:05   ESOPHAGOGASTRODUODENOSCOPY (EGD) WITH PROPOFOL N/A 10/24/2021   Procedure: ESOPHAGOGASTRODUODENOSCOPY (EGD) WITH PROPOFOL;  Surgeon: Dolores Frame, MD;  [facility];  Service: Gastroenterology;  Laterality: N/A;  1035   FRACTURE SURGERY     rod inside leg      Current Outpatient Medications  Medication Sig Dispense Refill   acetaminophen (TYLENOL) 500 MG tablet Take 1,000 mg by mouth every 6 (six) hours as needed for mild pain.     diphenhydrAMINE (BENADRYL) 25 MG tablet Take 25 mg by mouth every 6 (six) hours as needed for allergies or sleep.     folic acid (FOLVITE) 1 MG tablet Take 1 tablet (1 mg total) by mouth daily. 30 tablet 3   furosemide (LASIX) 20 MG tablet Take 1 tablet (20 mg total) by mouth every morning. 90 tablet 3   lactulose (CHRONULAC) 10 GM/15ML solution Take 15 mLs (10 g total) by mouth 2 (two) times daily. 946 mL 7   Multiple Vitamin (MULTIVITAMIN WITH MINERALS) TABS tablet Take 1 tablet by mouth daily.     nadolol (CORGARD) 40 MG tablet Take 1 tablet (40 mg total) by mouth daily. 90 tablet 3   omeprazole (PRILOSEC OTC) 20 MG tablet Take 1 tablet (20 mg total) by mouth 2 (two) times daily. 60 tablet 2   spironolactone (ALDACTONE) 50 MG tablet Take 1 tablet (50 mg total) by mouth daily. 90 tablet 3   thiamine 100 MG tablet Take 1 tablet (100 mg total) by mouth daily. 30 tablet 3   No current facility-administered medications for this visit.    Allergies as of 11/03/2021   (No Known Allergies)    No family history on file.  Social History    Socioeconomic History   Marital status: Single    Spouse name: Not on file   Number of children: Not on file   Years of education: Not on file   Highest education level: Not on file  Occupational History   Not on file  Tobacco Use   Smoking status: Every Day    Packs/day: 0.50    Types: Cigarettes, Cigars    Passive exposure: Current   Smokeless tobacco: Never  Vaping Use   Vaping Use: Never used  Substance and Sexual Activity   Alcohol use: Not Currently    Comment: Stopped 02/2021   Drug use: Yes    Frequency: 2.0 times per week    Types: Marijuana   Sexual activity: Not on file  Other Topics Concern   Not on file  Social History Narrative   Not on file   Social Determinants of Health   Financial Resource Strain: Not on file  Food Insecurity: Not on file  Transportation Needs: Not on file  Physical Activity: Not on file  Stress: Not on file  Social Connections: Not on file   Review of systems General: negative for malaise, night sweats, fever, chills, weight loss Neck: Negative for lumps, goiter, pain and significant  neck swelling Resp: Negative for cough, wheezing, dyspnea at rest CV: Negative for chest pain, leg swelling, palpitations, orthopnea GI: denies melena, hematochezia, nausea, vomiting, diarrhea, constipation, dysphagia, odyonophagia, early satiety or unintentional weight loss.  MSK: Negative for joint pain or swelling, back pain, and muscle pain. Derm: Negative for itching or rash Psych: Denies depression, anxiety, memory loss, confusion. No homicidal or suicidal ideation.  Heme: Negative for prolonged bleeding, bruising easily, and swollen nodes. Endocrine: Negative for cold or heat intolerance, polyuria, polydipsia and goiter. Neuro: negative for tremor, gait imbalance, syncope and seizures. The remainder of the review of systems is noncontributory.  Physical Exam: BP 114/75 (BP Location: Left Arm, Patient Position: Sitting, Cuff Size: Large)    Pulse 73   Temp 99.5 F (37.5 C) (Oral)   Ht 5\' 11"  (1.803 m)   Wt 227 lb 12.8 oz (103.3 kg)   BMI 31.77 kg/m  General:   Alert and oriented. No distress noted. Pleasant and cooperative.  Head:  Normocephalic and atraumatic. Eyes:  Conjuctiva clear without scleral icterus. Mouth:  Oral mucosa pink and moist. Good dentition. No lesions. Heart: Normal rate and rhythm, s1 and s2 heart sounds present.  Lungs: Clear lung sounds in all lobes. Respirations equal and unlabored. Abdomen:  +BS, soft, non-tender and non-distended. No rebound or guarding. No HSM or masses noted. Derm: No palmar erythema or jaundice Msk:  Symmetrical without gross deformities. Normal posture. Extremities:  Without edema. Neurologic:  Alert and  oriented x4 Psych:  Alert and cooperative. Normal mood and affect.  Invalid input(s): "6 MONTHS"   ASSESSMENT: Brody Bonneau is a 41 y.o. male presenting today for follow up of cirrhosis.  Patient has no GI complaints today. Denies jaundice, pruritus, itching, swelling to abdomen, LEs, confusion. Has not had alcohol since October, feels he is doing well, not tempting himself or being around people who will influence him to drink. I congratulated him on this as remaining sober is the most important thing he can do in regards to his liver health. Reports compliance with his medications without side effects. No dizziness, fatigue or lightheadedness from beta blocker. Will continue with current medication regimen as he is doing well with this. MELD continues to remain low, last was 6 in January. Will update RUQ February as this was due in June and MELD labs, INR and AFP. Will also recheck iron studies as iron saturation and total iron was elevated at last visit, he is still taking multivitamin, I advised him to stop this if iron continues to be elevated.    PLAN:  -RUQ July -MELD labs, INR, AFP -continue lactulose 10g BID -Continue lasix 20 and spironolactone 50mg  daily -Continue  nadolol 40mg  daily  - Reduce salt intake to <2 g per day - Can take Tylenol max of 2 g per day (650 mg q8h) for pain - Avoid NSAIDs for pain - Avoid eating raw oysters/shellfish - Ensure every night before going to sleep  All questions were answered, patient verbalized understanding and is in agreement with plan as outlined above.   Follow Up: January 2023  Allin Frix L. , MSN, APRN, AGNP-C Adult-Gerontology Nurse Practitioner [facility]

## 2021-11-06 LAB — COMPREHENSIVE METABOLIC PANEL
AG Ratio: 1 (calc) (ref 1.0–2.5)
ALT: 23 U/L (ref 9–46)
AST: 32 U/L (ref 10–40)
Albumin: 4.2 g/dL (ref 3.6–5.1)
Alkaline phosphatase (APISO): 122 U/L (ref 36–130)
BUN: 12 mg/dL (ref 7–25)
CO2: 21 mmol/L (ref 20–32)
Calcium: 9.9 mg/dL (ref 8.6–10.3)
Chloride: 103 mmol/L (ref 98–110)
Creat: 0.8 mg/dL (ref 0.60–1.29)
Globulin: 4.3 g/dL (calc) — ABNORMAL HIGH (ref 1.9–3.7)
Glucose, Bld: 83 mg/dL (ref 65–99)
Potassium: 4.3 mmol/L (ref 3.5–5.3)
Sodium: 136 mmol/L (ref 135–146)
Total Bilirubin: 1.1 mg/dL (ref 0.2–1.2)
Total Protein: 8.5 g/dL — ABNORMAL HIGH (ref 6.1–8.1)

## 2021-11-06 LAB — IRON,TIBC AND FERRITIN PANEL
%SAT: 69 % (calc) — ABNORMAL HIGH (ref 20–48)
Ferritin: 58 ng/mL (ref 38–380)
Iron: 326 ug/dL — ABNORMAL HIGH (ref 50–180)
TIBC: 472 mcg/dL (calc) — ABNORMAL HIGH (ref 250–425)

## 2021-11-06 LAB — CBC
HCT: 48.3 % (ref 38.5–50.0)
Hemoglobin: 17.2 g/dL — ABNORMAL HIGH (ref 13.2–17.1)
MCH: 32.5 pg (ref 27.0–33.0)
MCHC: 35.6 g/dL (ref 32.0–36.0)
MCV: 91.3 fL (ref 80.0–100.0)
MPV: 11.3 fL (ref 7.5–12.5)
Platelets: 168 10*3/uL (ref 140–400)
RBC: 5.29 10*6/uL (ref 4.20–5.80)
RDW: 13.8 % (ref 11.0–15.0)
WBC: 11.8 10*3/uL — ABNORMAL HIGH (ref 3.8–10.8)

## 2021-11-06 LAB — PROTIME-INR
INR: 1.1
Prothrombin Time: 11.4 s (ref 9.0–11.5)

## 2021-11-06 LAB — AFP TUMOR MARKER: AFP-Tumor Marker: 5.3 ng/mL (ref <6.1)

## 2021-11-10 ENCOUNTER — Other Ambulatory Visit (INDEPENDENT_AMBULATORY_CARE_PROVIDER_SITE_OTHER): Payer: Self-pay | Admitting: Gastroenterology

## 2021-11-12 ENCOUNTER — Telehealth: Payer: Self-pay

## 2021-11-12 ENCOUNTER — Ambulatory Visit (HOSPITAL_COMMUNITY)
Admission: RE | Admit: 2021-11-12 | Discharge: 2021-11-12 | Disposition: A | Discharge status: Home / Self Care | Payer: Self-pay | Source: Ambulatory Visit | Attending: Gastroenterology | Admitting: Gastroenterology

## 2021-11-12 DIAGNOSIS — K7030 Alcoholic cirrhosis of liver without ascites: Secondary | ICD-10-CM | POA: Insufficient documentation

## 2021-11-12 NOTE — Telephone Encounter (Signed)
Patient can not afford the Hereditary Hemochromatosis test that was ordered cost out of pocket is $217.00. Patient has no insurance.

## 2021-11-12 NOTE — Telephone Encounter (Signed)
I called and left a message asked that patient please return call.  ?

## 2021-11-13 NOTE — Telephone Encounter (Signed)
I called and left a message asked that patient please return call.  ?

## 2021-11-13 NOTE — Telephone Encounter (Signed)
Patient made aware when he is able to afford to let us know and we will reorder.

## 2021-11-20 ENCOUNTER — Other Ambulatory Visit (INDEPENDENT_AMBULATORY_CARE_PROVIDER_SITE_OTHER): Payer: Self-pay | Admitting: Gastroenterology

## 2021-12-01 ENCOUNTER — Other Ambulatory Visit (INDEPENDENT_AMBULATORY_CARE_PROVIDER_SITE_OTHER): Payer: Self-pay

## 2021-12-01 MED ORDER — NADOLOL 40 MG PO TABS
40.0000 mg | ORAL_TABLET | Freq: Every day | ORAL | 3 refills | Status: DC
Start: 2021-12-01 — End: 2022-12-07

## 2021-12-15 ENCOUNTER — Other Ambulatory Visit (INDEPENDENT_AMBULATORY_CARE_PROVIDER_SITE_OTHER): Payer: Self-pay | Admitting: Gastroenterology

## 2021-12-16 ENCOUNTER — Other Ambulatory Visit (INDEPENDENT_AMBULATORY_CARE_PROVIDER_SITE_OTHER): Payer: Self-pay | Admitting: Gastroenterology

## 2021-12-16 NOTE — Telephone Encounter (Signed)
Last Office visit 11/03/21

## 2022-01-19 ENCOUNTER — Other Ambulatory Visit (INDEPENDENT_AMBULATORY_CARE_PROVIDER_SITE_OTHER): Payer: Self-pay | Admitting: Gastroenterology

## 2022-01-19 NOTE — Telephone Encounter (Signed)
Last seen 11/03/2021

## 2022-02-06 ENCOUNTER — Other Ambulatory Visit (INDEPENDENT_AMBULATORY_CARE_PROVIDER_SITE_OTHER): Payer: Self-pay | Admitting: Gastroenterology

## 2022-02-19 ENCOUNTER — Other Ambulatory Visit (INDEPENDENT_AMBULATORY_CARE_PROVIDER_SITE_OTHER): Payer: Self-pay

## 2022-02-19 ENCOUNTER — Other Ambulatory Visit (INDEPENDENT_AMBULATORY_CARE_PROVIDER_SITE_OTHER): Payer: Self-pay | Admitting: Gastroenterology

## 2022-02-19 NOTE — Telephone Encounter (Signed)
Next appointment 05/07/2022 is the next appointment. Patient last seen 11/03/2021

## 2022-03-27 ENCOUNTER — Other Ambulatory Visit (INDEPENDENT_AMBULATORY_CARE_PROVIDER_SITE_OTHER): Payer: Self-pay | Admitting: Gastroenterology

## 2022-04-09 ENCOUNTER — Other Ambulatory Visit (INDEPENDENT_AMBULATORY_CARE_PROVIDER_SITE_OTHER): Payer: Self-pay | Admitting: Gastroenterology

## 2022-04-09 NOTE — Telephone Encounter (Signed)
Last seen 11/03/21

## 2022-04-22 ENCOUNTER — Encounter (INDEPENDENT_AMBULATORY_CARE_PROVIDER_SITE_OTHER): Payer: Self-pay | Admitting: *Deleted

## 2022-04-24 IMAGING — DX DG CHEST 1V PORT
1 series · 1 of 1 positions shown · non-contrast
Comparison: None.

CLINICAL DATA: 39-year-old male with chest pain and shortness of
breath.

EXAM:
PORTABLE CHEST 1 VIEW

[chest ap]
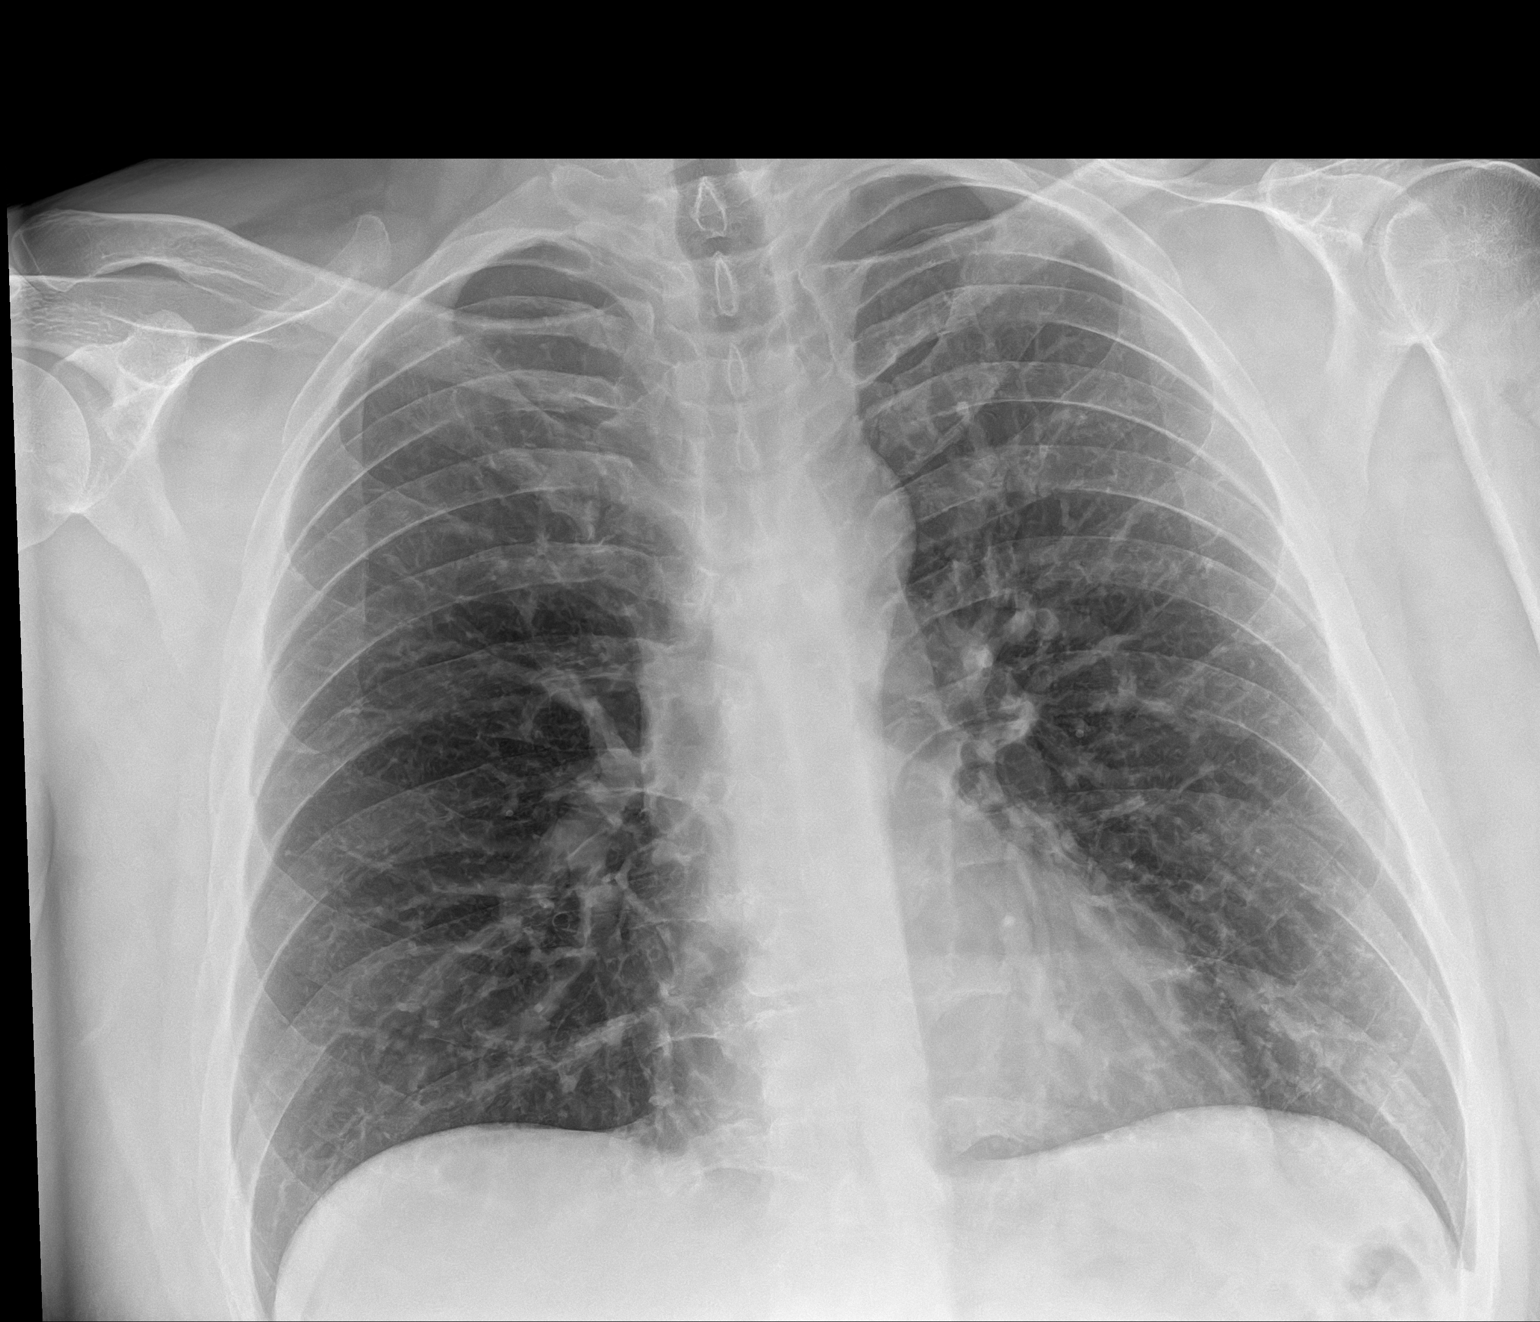

[1 of 1 positions shown; findings below may reference images not displayed]

FINDINGS: The heart size and mediastinal contours are within normal limits.
Both lungs are clear. The visualized skeletal structures are
unremarkable.
IMPRESSION: No active disease.

## 2022-05-07 ENCOUNTER — Ambulatory Visit (INDEPENDENT_AMBULATORY_CARE_PROVIDER_SITE_OTHER): Payer: Self-pay | Admitting: Gastroenterology

## 2022-06-24 ENCOUNTER — Other Ambulatory Visit (INDEPENDENT_AMBULATORY_CARE_PROVIDER_SITE_OTHER): Payer: Self-pay | Admitting: Gastroenterology

## 2022-07-02 ENCOUNTER — Ambulatory Visit (INDEPENDENT_AMBULATORY_CARE_PROVIDER_SITE_OTHER): Payer: Self-pay | Admitting: Gastroenterology

## 2022-07-02 ENCOUNTER — Encounter (INDEPENDENT_AMBULATORY_CARE_PROVIDER_SITE_OTHER): Payer: Self-pay | Admitting: Gastroenterology

## 2022-07-02 VITALS — BP 112/75 | HR 70 | Temp 98.3°F | Ht 71.0 in | Wt 253.0 lb

## 2022-07-02 DIAGNOSIS — K7030 Alcoholic cirrhosis of liver without ascites: Secondary | ICD-10-CM

## 2022-07-02 DIAGNOSIS — I8500 Esophageal varices without bleeding: Secondary | ICD-10-CM

## 2022-07-02 NOTE — Addendum Note (Signed)
Addended by: Harvel Quale on: 07/02/2022 08:43 PM   Modules accepted: Level of Service

## 2022-07-02 NOTE — Progress Notes (Addendum)
Referring Provider: No ref. provider found Primary Care Physician:  Timmothy Euler, MD (Inactive) Primary GI Physician: castaneda   Chief Complaint  Patient presents with   Cirrhosis    Follow up on Cirrhosis. Patient reports he is doing well and no concerns today.     HPI:   Craig Andrews is a 42 y.o. male with past medical history of  alcohol abuse, HTN, Cirrhosis, alcoholic hepatitis.    Patient presenting today for cirrhosis follow up  Last seen July 2023, at that time, No etoh since October. Denies confusion, swelling to abdomen or LEs. No pruritus or changes in skin color. Denies rectal bleeding or melena. Weight stable, appetite is good. Reports compliance with nadolol, diuretics and lactulose without any known side effects.Having a BM atleast once daily. Daughter present at visit confirms no episodes of confusion.    Recommended to continue with current medications, update RUQ [place], MELD labs, AFP and CBC.  RUQ [place] July 2023, Cirrhotic morphology associated with the liver with increased echogenicity and a mildly nodular contour.The recannulized umbilical vein is consistent with portal venous hypertension.  AFP 5.3, INR 1.1, plt count 168k, LFTs WNL  Iron studies remained elevated with iron 326, TIBC 472, sat 69% ferritin 58, advised to do hemochromatosis testing which was not completed.   Present: Doing well today. He reports compliance with his lactulose BID, nadalol '40mg'$  daily, lasix '20mg'$  and spironolactone '50mg'$  daily. Denies swelling to his abdomen or legs. Having 2 BMs per day. No episodes of confusion, jaundice or pruritus. He has a temp job and is hoping to get hired on permanently, no alcohol since October 2022. He feels he is doing very well. No red flag symptoms. Patient denies melena, hematochezia, nausea, vomiting, diarrhea, constipation, dysphagia, odyonophagia, early satiety or weight loss. Did not complete previous hemochromatosis lab as he has not had insurance  and the lab was too expensive.   Previous MELD 3.0:8  Cirrhosis related questions: Episodes of confusion/disorientation: no Taking diuretics? Lasix '20mg'$  and spironolactone '50mg'$  daily Beta blockers? Nadalol '40mg'$  daily  Prior history of variceal banding? yes Prior episodes of SBP? No  Last liver imaging:July 2023, no obvious masses or lesions  Alcohol use: none since oct 2022  Last Colonoscopy:never Last Endoscopy 6/23/23Grade I and grade II esophageal varices. - Portal hypertensive gastropathy. - Normal examined duodenum. - No specimens collected.     Recommendations:  Repeat egd June 2024   Past Medical History:  Diagnosis Date   Allergy    Hypertension     Past Surgical History:  Procedure Laterality Date   ESOPHAGEAL BANDING  04/01/2021   Procedure: ESOPHAGEAL BANDING;  Surgeon: Montez Morita, Quillian Quince, MD;  [facility];  Service: Gastroenterology;;   ESOPHAGOGASTRODUODENOSCOPY (EGD) WITH PROPOFOL N/A 04/01/2021   Procedure: ESOPHAGOGASTRODUODENOSCOPY (EGD) WITH PROPOFOL;  Surgeon: Harvel Quale, MD;  [facility];  Service: Gastroenterology;  Laterality: N/A;   ESOPHAGOGASTRODUODENOSCOPY (EGD) WITH PROPOFOL N/A 04/29/2021   Procedure: ESOPHAGOGASTRODUODENOSCOPY (EGD) WITH PROPOFOL;  Surgeon: Harvel Quale, MD;  [facility];  Service: Gastroenterology;  Laterality: N/A;  8:05   ESOPHAGOGASTRODUODENOSCOPY (EGD) WITH PROPOFOL N/A 10/24/2021   Procedure: ESOPHAGOGASTRODUODENOSCOPY (EGD) WITH PROPOFOL;  Surgeon: Harvel Quale, MD;  [facility];  Service: Gastroenterology;  Laterality: N/A;  1035   FRACTURE SURGERY     rod inside leg      Current Outpatient Medications  Medication Sig Dispense Refill   acetaminophen (TYLENOL) 500 MG tablet Take  1,000 mg by mouth every 6 (six) hours as needed for mild pain.     diphenhydrAMINE (BENADRYL) 25 MG tablet Take 25 mg by mouth every 6 (six) hours  as needed for allergies or sleep.     folic acid (FOLVITE) 1 MG tablet Take 1 tablet by mouth once daily 90 tablet 1   furosemide (LASIX) 20 MG tablet Take 1 tablet (20 mg total) by mouth every morning. 90 tablet 3   lactulose (CHRONULAC) 10 GM/15ML solution TAKE 15 ML BY MOUTH  TWICE DAILY 946 mL 0   Multiple Vitamin (MULTIVITAMIN WITH MINERALS) TABS tablet Take 1 tablet by mouth daily.     nadolol (CORGARD) 40 MG tablet Take 1 tablet (40 mg total) by mouth daily. 90 tablet 3   omeprazole (PRILOSEC OTC) 20 MG tablet Take 1 tablet (20 mg total) by mouth 2 (two) times daily. 60 tablet 2   omeprazole (PRILOSEC) 20 MG capsule Take 1 capsule by mouth twice daily 60 capsule 2   spironolactone (ALDACTONE) 50 MG tablet Take 1 tablet (50 mg total) by mouth daily. 90 tablet 3   thiamine (VITAMIN B1) 100 MG tablet Take 1 tablet by mouth once daily 90 tablet 1   No current facility-administered medications for this visit.    Allergies as of 07/02/2022   (No Known Allergies)    No family history on file.  Social History   Socioeconomic History   Marital status: Single    Spouse name: Not on file   Number of children: Not on file   Years of education: Not on file   Highest education level: Not on file  Occupational History   Not on file  Tobacco Use   Smoking status: Every Day    Packs/day: 0.50    Types: Cigarettes, Cigars    Passive exposure: Current   Smokeless tobacco: Never  Vaping Use   Vaping Use: Never used  Substance and Sexual Activity   Alcohol use: Not Currently    Comment: Stopped 02/2021   Drug use: Yes    Frequency: 2.0 times per week    Types: Marijuana   Sexual activity: Not on file  Other Topics Concern   Not on file  Social History Narrative   Not on file   Social Determinants of Health   Financial Resource Strain: Not on file  Food Insecurity: Not on file  Transportation Needs: Not on file  Physical Activity: Not on file  Stress: Not on file  Social  Connections: Not on file    Review of systems General: negative for malaise, night sweats, fever, chills, weight loss Neck: Negative for lumps, goiter, pain and significant neck swelling Resp: Negative for cough, wheezing, dyspnea at rest CV: Negative for chest pain, leg swelling, palpitations, orthopnea GI: denies melena, hematochezia, nausea, vomiting, diarrhea, constipation, dysphagia, odyonophagia, early satiety or unintentional weight loss.  MSK: Negative for joint pain or swelling, back pain, and muscle pain. Derm: Negative for itching or rash Psych: Denies depression, anxiety, memory loss, confusion. No homicidal or suicidal ideation.  Heme: Negative for prolonged bleeding, bruising easily, and swollen nodes. Endocrine: Negative for cold or heat intolerance, polyuria, polydipsia and goiter. Neuro: negative for tremor, gait imbalance, syncope and seizures. The remainder of the review of systems is noncontributory.  Physical Exam: BP 112/75 (BP Location: Left Arm, Patient Position: Sitting, Cuff Size: Large)   Pulse 70   Temp 98.3 F (36.8 C) (Oral)   Ht '5\' 11"'$  (1.803 m)  Wt 253 lb (114.8 kg)   BMI 35.29 kg/m  General:   Alert and oriented. No distress noted. Pleasant and cooperative.  Head:  Normocephalic and atraumatic. Eyes:  Conjuctiva clear without scleral icterus. Mouth:  Oral mucosa pink and moist. Good dentition. No lesions. Heart: Normal rate and rhythm, s1 and s2 heart sounds present.  Lungs: Clear lung sounds in all lobes. Respirations equal and unlabored. Abdomen:  +BS, soft, non-tender and non-distended. No rebound or guarding. No HSM or masses noted. Derm: No palmar erythema or jaundice Msk:  Symmetrical without gross deformities. Normal posture. Extremities:  Without edema. Neurologic:  Alert and  oriented x4 Psych:  Alert and cooperative. Normal mood and affect.  Invalid input(s): "6 MONTHS"   ASSESSMENT: Craig Andrews is a 42 y.o. male presenting  today for cirrhosis follow up.  History of cirrhosis related to previous alcohol abuse however patient has been sober since around October 2022 which I congratulated him on.  Last MELD 3.0 8 in July 2023. he was maintained on nadolol 40 mg once daily for grade I and II varices on EGD in June 2023.  He denies any ascites, edema to lower extremities, confusion, pruritus, jaundice.  He is maintained on Lasix 20 mg daily and spironolactone 50 mg daily which seems to be working well for him.  He is due for ultrasound liver for [facility] screening as well as MELD labs, CBC, AFP.  He is currently working a temp job with hopes of being higher and permanently and will then have medical insurance.  He is planning to schedule ultrasound and obtain these labs once he has insurance due to [facility] cost without. Notably he has had elevated iron studies in the past, hemochromatosis testing was ordered after last OV but due to the cost and lack of insurance he has not been able to have this testing done. Should plan to complete this as well once he has medical insurance.  He will be due for esophageal variceal screening in June 2024.  He was encouraged to continue with complete alcohol cessation and will continue current medication regimen.    PLAN:  -RUQ [place] -MELD labs, CBC AFP -continue lactulose 10g BID -Continue lasix 20 and spironolactone '50mg'$  daily -Continue nadolol '40mg'$  daily  - Reduce salt intake to <2 g per day - Can take Tylenol max of 2 g per day (650 mg q8h) for pain - Avoid NSAIDs for pain - Avoid eating raw oysters/shellfish - Ensure every night before going to sleep -continue complete alcohol cessation. -EGD for EV screening in June 2024, ASA III ENDO 3  All questions were answered, patient verbalized understanding and is in agreement with plan as outlined above.   Follow Up: 6 months   Mykela Mewborn L. Alver Sorrow, MSN, APRN, AGNP-C Adult-Gerontology Nurse Practitioner [facility]  I have  reviewed the note and agree with the APP's assessment as described in this progress note.  Will discuss with patient if possible to increase nadolol to 40 mg BID , goal HR 55-60 BPM or BP of more than 100/60  Maylon Peppers, MD Gastroenterology and [facility]

## 2022-07-02 NOTE — Patient Instructions (Signed)
You are due for an ultrasound of the liver and labs, I will order this and you can call once you have insurance and are able to do it,  -continue lactulose 10g BID -Continue lasix 20 and spironolactone '50mg'$  daily -Continue nadolol '40mg'$  daily  - Reduce salt intake to <2 g per day - Can take Tylenol max of 2 g per day (650 mg q8h) for pain - Avoid NSAIDs for pain - Avoid eating raw oysters/shellfish - Ensure every night before going to sleep  Congratulations on your sobriety! Keep up the great work!!  F/u 6 months   It was a pleasure to see you today. I want to create trusting relationships with patients and provide genuine, compassionate, and quality care. I truly value your feedback! please be on the lookout for a survey regarding your visit with me today. I appreciate your input about our visit and your time in completing this!    Ary Lavine L. Alver Sorrow, MSN, APRN, AGNP-C Adult-Gerontology Nurse Practitioner [facility] Gastroenterology at [facility]

## 2022-08-26 ENCOUNTER — Other Ambulatory Visit (INDEPENDENT_AMBULATORY_CARE_PROVIDER_SITE_OTHER): Payer: Self-pay | Admitting: Gastroenterology

## 2022-09-10 ENCOUNTER — Encounter (INDEPENDENT_AMBULATORY_CARE_PROVIDER_SITE_OTHER): Payer: Self-pay | Admitting: *Deleted

## 2022-10-06 ENCOUNTER — Other Ambulatory Visit (INDEPENDENT_AMBULATORY_CARE_PROVIDER_SITE_OTHER): Payer: Self-pay | Admitting: Gastroenterology

## 2022-10-06 NOTE — Telephone Encounter (Signed)
Last office visit 07/02/22.

## 2022-12-05 ENCOUNTER — Other Ambulatory Visit (INDEPENDENT_AMBULATORY_CARE_PROVIDER_SITE_OTHER): Payer: Self-pay | Admitting: Gastroenterology

## 2022-12-31 ENCOUNTER — Ambulatory Visit (INDEPENDENT_AMBULATORY_CARE_PROVIDER_SITE_OTHER): Payer: BC Managed Care – PPO | Admitting: Gastroenterology

## 2022-12-31 ENCOUNTER — Other Ambulatory Visit (INDEPENDENT_AMBULATORY_CARE_PROVIDER_SITE_OTHER): Payer: Self-pay | Admitting: Gastroenterology

## 2022-12-31 ENCOUNTER — Encounter (INDEPENDENT_AMBULATORY_CARE_PROVIDER_SITE_OTHER): Payer: Self-pay | Admitting: Gastroenterology

## 2022-12-31 VITALS — BP 111/75 | HR 65 | Temp 97.1°F | Ht 71.0 in | Wt 199.0 lb

## 2022-12-31 DIAGNOSIS — I8500 Esophageal varices without bleeding: Secondary | ICD-10-CM | POA: Diagnosis not present

## 2022-12-31 DIAGNOSIS — R3915 Urgency of urination: Secondary | ICD-10-CM | POA: Diagnosis not present

## 2022-12-31 DIAGNOSIS — K7030 Alcoholic cirrhosis of liver without ascites: Secondary | ICD-10-CM | POA: Diagnosis not present

## 2022-12-31 MED ORDER — NADOLOL 40 MG PO TABS: 40.0000 mg | ORAL_TABLET | Freq: Two times a day (BID) | ORAL | 5 refills | Status: DC

## 2022-12-31 NOTE — Patient Instructions (Addendum)
Please continue with lactulose 15ml twice daily, lasix 20mg /spironolactone 50mg  daily As your heart rate was above 60 and BP greater than 100/55, we will increase nadolol 40mg  to twice daily as it is important to keep HR and BP under these numbers in order to prevent bleeding of your esophageal varices Continue with thiamine and folate daily It is important you avoid ALL ALCOHOL  You are due for US of the liver and liver related labs We will get you scheduled for EGD to re evaluate your esophageal varices I will check urinalysis given your urinary urgency and frequency, but would also encourage you to follow up with PCP about this  - Reduce salt intake to <2 g per day - Can take Tylenol max of 2 g per day (650 mg q8h) for pain - Avoid NSAIDs for pain - Avoid eating raw oysters/shellfish - Ensure every night before going to sleep  Follow up 6 months

## 2022-12-31 NOTE — Progress Notes (Addendum)
Referring Provider: No ref. provider found Primary Care Physician:  Elenora Gamma, MD (Inactive) Primary GI Physician: Dr. Levon Hedger   Chief Complaint  Patient presents with   Follow-up    Patient here today for a follow up on Alcoholic cirrhosis. He says he has been having issues with frequent urination. He is taking lasix 20 mg once every am. Patient says this has gotten so bad he has had to resort to wearing depends. He is also on Spironolactone 50 mg once per day. He is also on Nadalol 40 mg once per day and omeprazole 20 mg once per day. He is also taking lactulose 15 mg bid.   HPI:   Craig Andrews is a 42 y.o. male with past medical history of alcohol abuse, HTN, Cirrhosis, alcoholic hepatitis.    Patient presenting today for follow up of cirrhosis  Last seen February 2024, at that time doing well. Compliant with medications to include lactulose BID, nadalol 40mg  daily, lasix 20mg  and spironolactone 50mg  daily. MELD 3.0 was 8   Recommended to have RUQ [place], MELD labs, CBC, AFP, continue all medications, EGD in June 2024 for EV screening  Present: Patient states he started having urinary frequency and urgency a few months back. Having incontinence. He notes this happens even without drinking much water. Denies swelling in abdomen or legs. Denies feeling that his abdomen swells or becomes tight. He is taking lactulose, having atleast one BM per day. No rectal bleeding or melena. He denies any flank pain and hematuria. Still trying to avoid alcohol though does report a beer or two every now and then. Has lost some weight working a new job. Denies any new meds or medication changes. Denies lightheadedness or dizziness. No episodes of confusion. No jaundice or pruritus.  Previous MELD:8   Cirrhosis related questions: Episodes of confusion/disorientation: no  Taking diuretics? Yes  Beta blockers? Yes  Prior history of variceal banding? No  Prior episodes of SBP? no Last liver  imaging: 11/2021 Cirrhotic morphology associated with the liver with increased echogenicity and a mildly nodular contour. 2. The recannulized umbilical vein is consistent with portal venous hypertension. Alcohol use: as above, occasional    Last Colonoscopy:never Last Endoscopy 10/24/21 Grade I and grade II esophageal varices. - Portal hypertensive gastropathy. - Normal examined duodenum. - No specimens collected.  Past Medical History:  Diagnosis Date   Allergy    Hypertension     Past Surgical History:  Procedure Laterality Date   ESOPHAGEAL BANDING  04/01/2021   Procedure: ESOPHAGEAL BANDING;  Surgeon: Marguerita Merles, Reuel Boom, MD;  [facility];  Service: Gastroenterology;;   ESOPHAGOGASTRODUODENOSCOPY (EGD) WITH PROPOFOL N/A 04/01/2021   Procedure: ESOPHAGOGASTRODUODENOSCOPY (EGD) WITH PROPOFOL;  Surgeon: Dolores Frame, MD;  [facility];  Service: Gastroenterology;  Laterality: N/A;   ESOPHAGOGASTRODUODENOSCOPY (EGD) WITH PROPOFOL N/A 04/29/2021   Procedure: ESOPHAGOGASTRODUODENOSCOPY (EGD) WITH PROPOFOL;  Surgeon: Dolores Frame, MD;  [facility];  Service: Gastroenterology;  Laterality: N/A;  8:05   ESOPHAGOGASTRODUODENOSCOPY (EGD) WITH PROPOFOL N/A 10/24/2021   Procedure: ESOPHAGOGASTRODUODENOSCOPY (EGD) WITH PROPOFOL;  Surgeon: Dolores Frame, MD;  [facility];  Service: Gastroenterology;  Laterality: N/A;  1035   FRACTURE SURGERY     rod inside leg      Current Outpatient Medications  Medication Sig Dispense Refill   acetaminophen (TYLENOL) 500 MG tablet Take 1,000 mg by mouth every 6 (six) hours as needed for mild pain.     diphenhydrAMINE (  BENADRYL) 25 MG tablet Take 25 mg by mouth every 6 (six) hours as needed for allergies or sleep.     folic acid (FOLVITE) 1 MG tablet Take 1 tablet by mouth once daily 90 tablet 3   furosemide (LASIX) 20 MG tablet Take 1 tablet (20 mg total) by mouth  every morning. 90 tablet 3   lactulose (CHRONULAC) 10 GM/15ML solution TAKE 15 ML BY MOUTH  TWICE DAILY 946 mL 3   Multiple Vitamin (MULTIVITAMIN WITH MINERALS) TABS tablet Take 1 tablet by mouth daily.     nadolol (CORGARD) 40 MG tablet Take 1 tablet by mouth once daily 90 tablet 0   omeprazole (PRILOSEC OTC) 20 MG tablet Take 1 tablet (20 mg total) by mouth 2 (two) times daily. 60 tablet 2   spironolactone (ALDACTONE) 50 MG tablet Take 1 tablet by mouth once daily 90 tablet 3   thiamine (VITAMIN B1) 100 MG tablet Take 1 tablet by mouth once daily 90 tablet 1   No current facility-administered medications for this visit.    Allergies as of 12/31/2022   (No Known Allergies)    History reviewed. No pertinent family history.  Social History   Socioeconomic History   Marital status: Single    Spouse name: Not on file   Number of children: Not on file   Years of education: Not on file   Highest education level: Not on file  Occupational History   Not on file  Tobacco Use   Smoking status: Every Day    Current packs/day: 0.50    Types: Cigarettes, Cigars    Passive exposure: Current   Smokeless tobacco: Never  Vaping Use   Vaping status: Never Used  Substance and Sexual Activity   Alcohol use: Not Currently    Comment: Stopped 02/2021   Drug use: Yes    Frequency: 2.0 times per week    Types: Marijuana   Sexual activity: Not on file  Other Topics Concern   Not on file  Social History Narrative   Not on file   Social Determinants of Health   Financial Resource Strain: Not on file  Food Insecurity: Not on file  Transportation Needs: Not on file  Physical Activity: Not on file  Stress: Not on file  Social Connections: Unknown (09/16/2021)   Received from [employer]   Social Network    Social Network: Not on file    Review of systems General: negative for malaise, night sweats, fever, chills, weight loss Neck: Negative for lumps, goiter, pain and significant  neck swelling Resp: Negative for cough, wheezing, dyspnea at rest CV: Negative for chest pain, leg swelling, palpitations, orthopnea GI: denies melena, hematochezia, nausea, vomiting, diarrhea, constipation, dysphagia, odyonophagia, early satiety or unintentional weight loss.  Genitourinary: urinary frequency/urgency/incontinence  MSK: Negative for joint pain or swelling, back pain, and muscle pain. Derm: Negative for itching or rash Psych: Denies depression, anxiety, memory loss, confusion. No homicidal or suicidal ideation.  Heme: Negative for prolonged bleeding, bruising easily, and swollen nodes. Endocrine: Negative for cold or heat intolerance, polyuria, polydipsia and goiter. Neuro: negative for tremor, gait imbalance, syncope and seizures. The remainder of the review of systems is noncontributory.  Physical Exam: BP 111/75 (BP Location: Left Arm, Patient Position: Sitting, Cuff Size: Large)   Pulse 65   Temp (!) 97.1 F (36.2 C) (Temporal)   Ht 5\' 11"  (1.803 m)   Wt 199 lb (90.3 kg)   BMI 27.75 kg/m  General:   Alert  and oriented. No distress noted. Pleasant and cooperative.  Head:  Normocephalic and atraumatic. Eyes:  Conjuctiva clear without scleral icterus. Mouth:  Oral mucosa pink and moist. Good dentition. No lesions. Heart: Normal rate and rhythm, s1 and s2 heart sounds present.  Lungs: Clear lung sounds in all lobes. Respirations equal and unlabored. Abdomen:  +BS, soft, non-tender and non-distended. No rebound or guarding. No HSM or masses noted. Derm: No palmar erythema or jaundice Msk:  Symmetrical without gross deformities. Normal posture. Extremities:  Without edema. Neurologic:  Alert and  oriented x4 Psych:  Alert and cooperative. Normal mood and affect.  Invalid input(s): "6 MONTHS"   ASSESSMENT: Craig Andrews is a 42 y.o. 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  He remains well compensated at this time.  Last MELD  was 8. He failed to do [facility] screening via ultrasound and MELD labs after last visit and is overdue for these.  Will update MELD labs and right upper quadrant ultrasound today.  Last EGD in July 2023 with grade 1 esophageal varices, recommended to repeat EGD again in 1 year.  We will get this scheduled as well.  He is maintained on Lasix 20 mg and spironolactone 50 mg daily, no swelling to lower extremities, no ascites.  Having 1 BM per day on lactulose 15 mg twice daily.  He is on nadolol 40 mg daily, heart rate of 65 with blood pressure 111/75 today, we will increase nadolol to 40 mg twice daily given heart rate and BP are not at goal for esophageal varices bleeding prophylaxis.  Discussed the importance of complete alcohol cessation as he reports he is drinking again but on very rare occasion.  Urinary frequency/urgency: started a few months ago, no new meds or medication changes. Has been maintained on low dose lasix and spironolactone, doubt these have caused sudden onset of frequency/urgency without dosage changes. Will check UA to rule out infection, he should also follow with PCP for further evaluation of this as well.    PLAN:  -RUQ [place]  -. Continue with lasix 20/spironolactone 50mg  daily -  UA -continue lactulose 15ml BID - MELD labs, AFP, CBC - EGD for EV screening ASA III -COMPLETE ETOH CESSATION  -Increase nadolol 40mg  to BID - Follow with PCP regarding urinary issues - Reduce salt intake to <2 g per day - Can take Tylenol max of 2 g per day (650 mg q8h) for pain - Avoid NSAIDs for pain - Avoid eating raw oysters/shellfish - Ensure every night before going to sleep -continue thiamine and folate supplementation daily  All questions were answered, patient verbalized understanding and is in agreement with plan as outlined above.   Follow Up: 6 months   Marlon Vonruden L. Jeanmarie Hubert, MSN, APRN, AGNP-C Adult-Gerontology Nurse Practitioner [facility]  I have reviewed the  note and agree with the APP's assessment as described in this progress note  Katrinka Blazing, MD Gastroenterology and Hepatology [facility]

## 2023-01-06 NOTE — Patient Instructions (Signed)
20    Your procedure is scheduled on: 01/12/2023  Report to [facility] Main Entrance at   7:30  AM.  Call this number if you have problems the morning of surgery: [phone]   Remember:   Follow instructions on letter from office regarding when to stop eating and drinking        No Smoking the day of procedure      Take these medicines the morning of surgery with A SIP OF WATER: Omeprazole, and nadolol   Do not wear jewelry, make-up or nail polish.  Do not wear lotions, powders, or perfumes. You may wear deodorant.                Do not bring valuables to the hospital.  Contacts, dentures or bridgework may not be worn into surgery.  Leave suitcase in the car. After surgery it may be brought to your room.  For patients admitted to the hospital, checkout time is 11:00 AM the day of discharge.   Patients discharged the day of surgery will not be allowed to drive home. Upper Endoscopy, Adult Upper endoscopy is a procedure to look inside the upper GI (gastrointestinal) tract. The upper GI tract is made up of: The part of the body that moves food from your mouth to your stomach (esophagus). The stomach. The first part of your small intestine (duodenum). This procedure is also called esophagogastroduodenoscopy (EGD) or gastroscopy. In this procedure, your health care provider passes a thin, flexible tube (endoscope) through your mouth and down your esophagus into your stomach. A small camera is attached to the end of the tube. Images from the camera appear on a monitor in the exam room. During this procedure, your health care provider may also remove a small piece of tissue to be sent to a lab and examined under a microscope (biopsy). Your health care provider may do an upper endoscopy to diagnose cancers of the upper GI tract. You may also have this procedure to find the cause of other conditions, such as: Stomach pain. Heartburn. Pain or problems when swallowing. Nausea and  vomiting. Stomach bleeding. Stomach ulcers. Tell a health care provider about: Any allergies you have. All medicines you are taking, including vitamins, herbs, eye drops, creams, and over-the-counter medicines. Any problems you or family members have had with anesthetic medicines. Any blood disorders you have. Any surgeries you have had. Any medical conditions you have. Whether you are pregnant or may be pregnant. What are the risks? Generally, this is a safe procedure. However, problems may occur, including: Infection. Bleeding. Allergic reactions to medicines. A tear or hole (perforation) in the esophagus, stomach, or duodenum. What happens before the procedure? Staying hydrated Follow instructions from your health care provider about hydration, which may include: Up to 4 hours before the procedure - you may continue to drink clear liquids, such as water, clear fruit juice, black coffee, and plain tea.   Medicines Ask your health care provider about: Changing or stopping your regular medicines. This is especially important if you are taking diabetes medicines or blood thinners. Taking medicines such as aspirin and ibuprofen. These medicines can thin your blood. Do not take these medicines unless your health care provider tells you to take them. Taking over-the-counter medicines, vitamins, herbs, and supplements. General instructions Plan to have someone take you home from the hospital or clinic. If you will be going home right after the procedure, plan to have someone with you for 24 hours. Ask your  health care provider what steps will be taken to help prevent infection. What happens during the procedure?  An IV will be inserted into one of your veins. You may be given one or more of the following: A medicine to help you relax (sedative). A medicine to numb the throat (local anesthetic). You will lie on your left side on an exam table. Your health care provider will pass the  endoscope through your mouth and down your esophagus. Your health care provider will use the scope to check the inside of your esophagus, stomach, and duodenum. Biopsies may be taken. The endoscope will be removed. The procedure may vary among health care providers and hospitals. What happens after the procedure? Your blood pressure, heart rate, breathing rate, and blood oxygen level will be monitored until you leave the hospital or clinic. Do not drive for 24 hours if you were given a sedative during your procedure. When your throat is no longer numb, you may be given some fluids to drink. It is up to you to get the results of your procedure. Ask your health care provider, or the department that is doing the procedure, when your results will be ready. Summary Upper endoscopy is a procedure to look inside the upper GI tract. During the procedure, an IV will be inserted into one of your veins. You may be given a medicine to help you relax. A medicine will be used to numb your throat. The endoscope will be passed through your mouth and down your esophagus. This information is not intended to replace advice given to you by your health care provider. Make sure you discuss any questions you have with your health care provider. Document Revised: 10/13/2017 Document Reviewed: 09/20/2017 Elsevier Patient Education  2020 [employer]                                                                                                                                      EndoscopyCare After  Please read the instructions outlined below and refer to this sheet in the next few weeks. These discharge instructions provide you with general information on caring for yourself after you leave the hospital. Your doctor may also give you specific instructions. While your treatment has been planned according to the most current medical practices available, unavoidable complications occasionally occur. If you have any  problems or questions after discharge, please call your doctor. HOME CARE INSTRUCTIONS Activity You may resume your regular activity but move at a slower pace for the next 24 hours.  Take frequent rest periods for the next 24 hours.  Walking will help expel (get rid of) the air and reduce the bloated feeling in your abdomen.  No driving for 24 hours (because of the anesthesia (medicine) used during the test).  You may shower.  Do not sign any important legal documents or operate any machinery for 24 hours (because of the anesthesia used  during the test).  Nutrition Drink plenty of fluids.  You may resume your normal diet.  Begin with a light meal and progress to your normal diet.  Avoid alcoholic beverages for 24 hours or as instructed by your caregiver.  Medications You may resume your normal medications unless your caregiver tells you otherwise. What you can expect today You may experience abdominal discomfort such as a feeling of fullness or "gas" pains.  You may experience a sore throat for 2 to 3 days. This is normal. Gargling with salt water may help this.  Follow-up Your doctor will discuss the results of your test with you. SEEK IMMEDIATE MEDICAL CARE IF: You have excessive nausea (feeling sick to your stomach) and/or vomiting.  You have severe abdominal pain and distention (swelling).  You have trouble swallowing.  You have a temperature over 100 F (37.8 C).  You have rectal bleeding or vomiting of blood.  Document Released: 12/03/2003 Document Revised: 04/09/2011 Document Reviewed: 06/15/2007 Esophageal Varices  Esophageal varices are enlarged veins in the part of the body that moves food from the mouth to the stomach (esophagus). They develop when extra blood is forced to flow through these veins because the blood's normal flow is blocked. Without treatment, esophageal varices eventually break and bleed (hemorrhage), which can be life-threatening. What are the  causes? This condition may be caused by: Scarring of the liver due to alcoholism. This is the most common cause. Long-term liver disease. Severe heart failure. A blood clot in a vein that supplies the liver. A disease that causes inflammation in the organs and other body areas. What are the signs or symptoms? Esophageal varices usually do not cause symptoms unless they start to bleed. Symptoms of bleeding esophageal varices include: Vomiting material that is bright red or that is black and looks like coffee grounds. Coughing up blood. Stools (feces) that look black and tarry. Dizziness or light-headedness. Low blood pressure. Loss of consciousness. How is this diagnosed? This condition is diagnosed with a procedure called endoscopy. During endoscopy, your health care provider uses a flexible tube with a small camera on the end of it (endoscope) to look down your throat and examine your esophagus. You may also have other tests, including: Imaging tests, such as a CT scan or ultrasound. Blood tests. How is this treated? This condition may be treated with: Medicines. Medicines are usually used to treat varices that are not bleeding. Procedures. Procedures are done to treat varices that are bleeding. They stop bleeding, or reduce pressure and the risk of bleeding. Procedures include: Placing an elastic band around the varices to keep them from bleeding. Replacing blood that you have lost due to bleeding. This may include getting a transfusion of blood or parts of blood, such as platelets or clotting factors. You may be given antibiotic medicine to help prevent infection. Getting an injection into the varices that causes it to shrink and close (sclerotherapy). You may also be given medicines that tighten blood vessels or change blood flow. Placing a balloon in the esophagus and inflating it. The balloon applies pressure to the bleeding veins to help stop the bleeding. Placing a small tube  within the veins in the liver. This decreases blood flow and pressure in the esophageal varices. If other treatments do not work, you may need a liver transplant. Follow these instructions at home: Medicines Take over-the-counter and prescription medicines only as told by your health care provider. If you were prescribed an antibiotic medicine, take it as  told by your health care provider. Do not stop taking the antibiotic even if you start to feel better. Do not take any NSAIDs (such as aspirin or ibuprofen) before first getting approval from your health care provider. General instructions  Do not drink alcohol. Return to your normal activities as told by your health care provider. Ask your health care provider what activities are safe for you. Avoid vigorous physical activity. Ask your health care provider what exercises are safe for you. Keep all follow-up visits. Contact a health care provider if: You have pain in the abdomen. You are unable to eat or drink. Get help right away if: You vomit blood or have blood in your stool. You have stools that look black or tarry. You have chest pain. You feel dizzy or have low blood pressure. You lose consciousness. These symptoms may represent a serious problem that is an emergency. Do not wait to see if the symptoms will go away. Get medical help right away. Call your local emergency services (911 in the [place] Do not drive yourself to the hospital. Summary Esophageal varices are enlarged veins in the esophagus, the part of your body that moves food from your mouth to your stomach. Without treatment, esophageal varices eventually break and bleed, which can be life-threatening. Esophageal varices usually do not cause symptoms unless they start to bleed. Keep all follow-up visits. This is important. This information is not intended to replace advice given to you by your health care provider. Make sure you discuss any questions you have with your  health care provider. Document Revised: 08/08/2019 Document Reviewed: 08/08/2019 Elsevier Patient Education  2024 [employer].

## 2023-01-08 ENCOUNTER — Other Ambulatory Visit: Payer: Self-pay

## 2023-01-08 ENCOUNTER — Encounter (HOSPITAL_COMMUNITY): Payer: Self-pay

## 2023-01-08 ENCOUNTER — Emergency Department (HOSPITAL_COMMUNITY)
Admission: EM | Admit: 2023-01-08 | Discharge: 2023-01-08 | Disposition: A | Discharge status: Home / Self Care | Payer: BC Managed Care – PPO | Attending: Emergency Medicine | Admitting: Emergency Medicine

## 2023-01-08 ENCOUNTER — Encounter (HOSPITAL_COMMUNITY)
Admission: RE | Admit: 2023-01-08 | Discharge: 2023-01-08 | Disposition: A | Discharge status: Home / Self Care | Payer: BC Managed Care – PPO | Source: Ambulatory Visit | Attending: Gastroenterology | Admitting: Gastroenterology

## 2023-01-08 VITALS — BP 111/75 | HR 65 | Temp 97.1°F | Resp 18 | Ht 71.0 in | Wt 199.0 lb

## 2023-01-08 DIAGNOSIS — Z01810 Encounter for preprocedural cardiovascular examination: Secondary | ICD-10-CM | POA: Diagnosis not present

## 2023-01-08 DIAGNOSIS — Z01818 Encounter for other preprocedural examination: Secondary | ICD-10-CM | POA: Diagnosis not present

## 2023-01-08 DIAGNOSIS — K7010 Alcoholic hepatitis without ascites: Secondary | ICD-10-CM | POA: Insufficient documentation

## 2023-01-08 DIAGNOSIS — I10 Essential (primary) hypertension: Secondary | ICD-10-CM | POA: Diagnosis not present

## 2023-01-08 DIAGNOSIS — Z7984 Long term (current) use of oral hypoglycemic drugs: Secondary | ICD-10-CM | POA: Diagnosis not present

## 2023-01-08 DIAGNOSIS — R799 Abnormal finding of blood chemistry, unspecified: Secondary | ICD-10-CM | POA: Diagnosis not present

## 2023-01-08 DIAGNOSIS — T502X1A Poisoning by carbonic-anhydrase inhibitors, benzothiadiazides and other diuretics, accidental (unintentional), initial encounter: Secondary | ICD-10-CM | POA: Diagnosis not present

## 2023-01-08 DIAGNOSIS — Z01812 Encounter for preprocedural laboratory examination: Secondary | ICD-10-CM | POA: Insufficient documentation

## 2023-01-08 DIAGNOSIS — E1165 Type 2 diabetes mellitus with hyperglycemia: Secondary | ICD-10-CM | POA: Diagnosis not present

## 2023-01-08 HISTORY — DX: Unspecified cirrhosis of liver: K74.60

## 2023-01-08 LAB — CBC WITH DIFFERENTIAL/PLATELET
Abs Immature Granulocytes: 0.04 10*3/uL (ref 0.00–0.07)
Abs Immature Granulocytes: 0.02 10*3/uL (ref 0.00–0.07)
Basophils Absolute: 0.1 10*3/uL (ref 0.0–0.1)
Basophils Absolute: 0.1 10*3/uL (ref 0.0–0.1)
Basophils Relative: 1 %
Basophils Relative: 1 %
Eosinophils Absolute: 0.3 10*3/uL (ref 0.0–0.5)
Eosinophils Absolute: 0.3 10*3/uL (ref 0.0–0.5)
Eosinophils Relative: 4 %
Eosinophils Relative: 3 %
HCT: 46 % (ref 39.0–52.0)
HCT: 46 % (ref 39.0–52.0)
Hemoglobin: 16.2 g/dL (ref 13.0–17.0)
Hemoglobin: 16.2 g/dL (ref 13.0–17.0)
Immature Granulocytes: 1 %
Immature Granulocytes: 0 %
Lymphocytes Relative: 35 %
Lymphocytes Relative: 30 %
Lymphs Abs: 2.9 10*3/uL (ref 0.7–4.0)
Lymphs Abs: 3 10*3/uL (ref 0.7–4.0)
MCH: 34.6 pg — ABNORMAL HIGH (ref 26.0–34.0)
MCH: 34.4 pg — ABNORMAL HIGH (ref 26.0–34.0)
MCHC: 35.2 g/dL (ref 30.0–36.0)
MCHC: 35.2 g/dL (ref 30.0–36.0)
MCV: 98.3 fL (ref 80.0–100.0)
MCV: 97.7 fL (ref 80.0–100.0)
Monocytes Absolute: 0.6 10*3/uL (ref 0.1–1.0)
Monocytes Absolute: 0.9 10*3/uL (ref 0.1–1.0)
Monocytes Relative: 8 %
Monocytes Relative: 9 %
Neutro Abs: 4.4 10*3/uL (ref 1.7–7.7)
Neutro Abs: 5.8 10*3/uL (ref 1.7–7.7)
Neutrophils Relative %: 51 %
Neutrophils Relative %: 57 %
Platelets: 129 10*3/uL — ABNORMAL LOW (ref 150–400)
Platelets: 131 10*3/uL — ABNORMAL LOW (ref 150–400)
RBC: 4.68 MIL/uL (ref 4.22–5.81)
RBC: 4.71 MIL/uL (ref 4.22–5.81)
RDW: 11.6 % (ref 11.5–15.5)
RDW: 11.6 % (ref 11.5–15.5)
WBC: 8.3 10*3/uL (ref 4.0–10.5)
WBC: 10.1 10*3/uL (ref 4.0–10.5)
nRBC: 0 % (ref 0.0–0.2)
nRBC: 0 % (ref 0.0–0.2)

## 2023-01-08 LAB — COMPREHENSIVE METABOLIC PANEL
ALT: 31 U/L (ref 0–44)
AST: 26 U/L (ref 15–41)
Albumin: 3.8 g/dL (ref 3.5–5.0)
Alkaline Phosphatase: 106 U/L (ref 38–126)
Anion gap: 13 (ref 5–15)
BUN: 7 mg/dL (ref 6–20)
CO2: 23 mmol/L (ref 22–32)
Calcium: 9.1 mg/dL (ref 8.9–10.3)
Chloride: 94 mmol/L — ABNORMAL LOW (ref 98–111)
Creatinine, Ser: 0.61 mg/dL (ref 0.61–1.24)
GFR, Estimated: >60 mL/min (ref >60)
Glucose, Bld: 405 mg/dL — ABNORMAL HIGH (ref 70–99)
Potassium: 3.7 mmol/L (ref 3.5–5.1)
Sodium: 130 mmol/L — ABNORMAL LOW (ref 135–145)
Total Bilirubin: 1.2 mg/dL (ref 0.3–1.2)
Total Protein: 8.1 g/dL (ref 6.5–8.1)

## 2023-01-08 LAB — BASIC METABOLIC PANEL
Anion gap: 15 (ref 5–15)
BUN: 10 mg/dL (ref 6–20)
CO2: 20 mmol/L — ABNORMAL LOW (ref 22–32)
Calcium: 9 mg/dL (ref 8.9–10.3)
Chloride: 95 mmol/L — ABNORMAL LOW (ref 98–111)
Creatinine, Ser: 0.72 mg/dL (ref 0.61–1.24)
GFR, Estimated: >60 mL/min (ref >60)
Glucose, Bld: 378 mg/dL — ABNORMAL HIGH (ref 70–99)
Potassium: 3.4 mmol/L — ABNORMAL LOW (ref 3.5–5.1)
Sodium: 130 mmol/L — ABNORMAL LOW (ref 135–145)

## 2023-01-08 LAB — URINALYSIS, ROUTINE W REFLEX MICROSCOPIC
Bacteria, UA: NONE SEEN
Bilirubin Urine: NEGATIVE
Glucose, UA: >=500 mg/dL — AB
Hgb urine dipstick: NEGATIVE
Ketones, ur: 5 mg/dL — AB
Leukocytes,Ua: NEGATIVE
Nitrite: NEGATIVE
Protein, ur: NEGATIVE mg/dL
Specific Gravity, Urine: 1.028 (ref 1.005–1.030)
pH: 6 (ref 5.0–8.0)

## 2023-01-08 LAB — HEPATIC FUNCTION PANEL
ALT: 33 U/L (ref 0–44)
AST: 26 U/L (ref 15–41)
Albumin: 4 g/dL (ref 3.5–5.0)
Alkaline Phosphatase: 123 U/L (ref 38–126)
Bilirubin, Direct: 0.2 mg/dL (ref 0.0–0.2)
Indirect Bilirubin: 0.6 mg/dL (ref 0.3–0.9)
Total Bilirubin: 0.8 mg/dL (ref 0.3–1.2)
Total Protein: 8.4 g/dL — ABNORMAL HIGH (ref 6.5–8.1)

## 2023-01-08 LAB — CBG MONITORING, ED
Glucose-Capillary: 398 mg/dL — ABNORMAL HIGH (ref 70–99)
Glucose-Capillary: 305 mg/dL — ABNORMAL HIGH (ref 70–99)

## 2023-01-08 LAB — PROTIME-INR
INR: 1.1 (ref 0.8–1.2)
Prothrombin Time: 14.4 s (ref 11.4–15.2)

## 2023-01-08 LAB — HEMOGLOBIN A1C
Hgb A1c MFr Bld: 13.4 % — ABNORMAL HIGH (ref 4.8–5.6)
Mean Plasma Glucose: 337.88 mg/dL

## 2023-01-08 MED ORDER — LACTATED RINGERS IV BOLUS
1000.0000 mL | Freq: Once | INTRAVENOUS | Status: AC
  Administered 2023-01-08: 1000 mL via INTRAVENOUS

## 2023-01-08 MED ORDER — METFORMIN HCL 500 MG PO TABS: 500.0000 mg | ORAL_TABLET | Freq: Two times a day (BID) | ORAL | 0 refills | Status: DC

## 2023-01-08 MED ORDER — LANCETS MISC. MISC
1.0000 | Freq: Three times a day (TID) | 0 refills | Status: AC
Start: 2023-01-08 — End: 2023-02-07

## 2023-01-08 MED ORDER — BLOOD GLUCOSE TEST VI STRP
1.0000 | ORAL_STRIP | Freq: Three times a day (TID) | 0 refills | Status: AC
Start: 2023-01-08 — End: 2023-02-07

## 2023-01-08 MED ORDER — BLOOD GLUCOSE MONITORING SUPPL DEVI
1.0000 | Freq: Three times a day (TID) | 0 refills | Status: AC
Start: 2023-01-08 — End: ?

## 2023-01-08 MED ORDER — LANCET DEVICE MISC
1.0000 | Freq: Three times a day (TID) | 0 refills | Status: AC
Start: 2023-01-08 — End: 2023-02-07

## 2023-01-08 NOTE — ED Provider Notes (Signed)
[place] EMERGENCY DEPARTMENT AT [facility] Provider Note   CSN: [id number] Arrival date & time: 01/08/23  1527     History  Chief Complaint  Patient presents with   Abnormal Lab    Legion Craig Andrews is a 42 y.o. male.  He presents ER today for hyperglycemia noted on screening labs yesterday.  He has been having polyuria polydipsia but this was felt to be due to the diuretics he takes for his liver failure.  No fever, no chills, no vomiting, no abdominal pain he has family history of diabetes but no personal history of diabetes   Abnormal Lab      Home Medications Prior to Admission medications   Medication Sig Start Date End Date Taking? Authorizing Provider  Blood Glucose Monitoring Suppl DEVI 1 each by Does not apply route in the morning, at noon, and at bedtime. May substitute to any manufacturer covered by patient's insurance. 01/08/23  Yes Lucile Hillmann A, PA-C  Glucose Blood (BLOOD GLUCOSE TEST STRIPS) STRP 1 each by In Vitro route in the morning, at noon, and at bedtime. May substitute to any manufacturer covered by patient's insurance. 01/08/23 02/07/23 Yes Cipriano Millikan A, PA-C  Lancet Device MISC 1 each by Does not apply route in the morning, at noon, and at bedtime. May substitute to any manufacturer covered by patient's insurance. 01/08/23 02/07/23 Yes Sebastain Fishbaugh A, PA-C  Lancets Misc. MISC 1 each by Does not apply route in the morning, at noon, and at bedtime. May substitute to any manufacturer covered by patient's insurance. 01/08/23 02/07/23 Yes Berma Harts A, PA-C  metFORMIN (GLUCOPHAGE) 500 MG tablet Take 1 tablet (500 mg total) by mouth 2 (two) times daily with a meal. 01/08/23  Yes Darreon Lutes A, PA-C  acetaminophen (TYLENOL) 500 MG tablet Take 1,000 mg by mouth every 6 (six) hours as needed for mild pain.    [provider]  diphenhydrAMINE (BENADRYL) 25 MG tablet Take 25 mg by mouth every 6 (six) hours as needed for allergies or sleep.     [provider]  folic acid (FOLVITE) 1 MG tablet Take 1 tablet by mouth once daily 10/06/22   Carlan, Chelsea L, NP  furosemide (LASIX) 20 MG tablet TAKE 1 TABLET BY MOUTH ONCE DAILY IN THE MORNING 12/31/22   Carlan, Chelsea L, NP  lactulose (CHRONULAC) 10 GM/15ML solution TAKE 15 ML BY MOUTH  TWICE DAILY 08/26/22   Carlan, Chelsea L, NP  Multiple Vitamin (MULTIVITAMIN WITH MINERALS) TABS tablet Take 1 tablet by mouth daily.    [provider]  nadolol (CORGARD) 40 MG tablet Take 1 tablet (40 mg total) by mouth 2 (two) times daily. 12/31/22   Carlan, Jeral Pinch, NP  omeprazole (PRILOSEC OTC) 20 MG tablet Take 1 tablet (20 mg total) by mouth 2 (two) times daily. 08/06/21 12/31/22  Raquel James, NP  spironolactone (ALDACTONE) 50 MG tablet Take 1 tablet by mouth once daily 08/26/22   Carlan, Chelsea L, NP  thiamine (VITAMIN B1) 100 MG tablet Take 1 tablet by mouth once daily 03/31/22   Carlan, Jeral Pinch, NP      Allergies    Patient has no known allergies.    Review of Systems   Review of Systems  Physical Exam Updated Vital Signs BP 109/81 (BP Location: Right Arm)   Pulse 69   Temp 98.3 F (36.8 C) (Oral)   Resp 17   Ht 5\' 11"  (1.803 m)   Wt 90.3 kg  SpO2 96%   BMI 27.75 kg/m  Physical Exam Vitals and nursing note reviewed.  Constitutional:      General: He is not in acute distress.    Appearance: He is well-developed.  HENT:     Head: Normocephalic and atraumatic.     Mouth/Throat:     Mouth: Mucous membranes are moist.  Eyes:     Conjunctiva/sclera: Conjunctivae normal.  Cardiovascular:     Rate and Rhythm: Normal rate and regular rhythm.     Heart sounds: No murmur heard. Pulmonary:     Effort: Pulmonary effort is normal. No respiratory distress.     Breath sounds: Normal breath sounds.  Abdominal:     Palpations: Abdomen is soft.     Tenderness: There is no abdominal tenderness.  Musculoskeletal:        General: No swelling. Normal range of motion.      Cervical back: Neck supple.  Skin:    General: Skin is warm and dry.     Capillary Refill: Capillary refill takes less than 2 seconds.  Neurological:     General: No focal deficit present.     Mental Status: He is alert and oriented to person, place, and time.  Psychiatric:        Mood and Affect: Mood normal.     ED Results / Procedures / Treatments   Labs (all labs ordered are listed, but only abnormal results are displayed) Labs Reviewed  BASIC METABOLIC PANEL - Abnormal; Notable for the following components:      Result Value   Sodium 130 (*)    Potassium 3.4 (*)    Chloride 95 (*)    CO2 20 (*)    Glucose, Bld 378 (*)    All other components within normal limits  CBC WITH DIFFERENTIAL/PLATELET - Abnormal; Notable for the following components:   MCH 34.4 (*)    Platelets 131 (*)    All other components within normal limits  HEPATIC FUNCTION PANEL - Abnormal; Notable for the following components:   Total Protein 8.4 (*)    All other components within normal limits  CBG MONITORING, ED - Abnormal; Notable for the following components:   Glucose-Capillary 398 (*)    All other components within normal limits  CBG MONITORING, ED - Abnormal; Notable for the following components:   Glucose-Capillary 305 (*)    All other components within normal limits  HEMOGLOBIN A1C    EKG None  Radiology No results found.  Procedures Procedures    Medications Ordered in ED Medications  lactated ringers bolus 1,000 mL (0 mLs Intravenous Stopped 01/08/23 1856)    ED Course/ Medical Decision Making/ A&P                                 Medical Decision Making Ddx: Diabetes, hyperglycemia, HHS, DKA, other  ED course: Patient presents for high blood sugar has been having polyuria polydipsia, he is well-appearing well-hydrated sugar was around 400, after fluids it is down to 305.  Given diabetes educator referral, glucometer and instructions for this, discussed dietary changes,  he does have a poor diet high in refined carbohydrates, advised on close PCP follow-up and return precautions started on metformin twice daily, instructed may cause some nausea and diarrhea but should get better and he should continue the medicine.  Given strict return precautions.  Amount and/or Complexity of Data Reviewed External Data Reviewed: labs and  notes. Labs: ordered.    Details: Hyperglycemia, no increased anion gap, no acidosis           Final Clinical Impression(s) / ED Diagnoses Final diagnoses:  Hyperglycemia due to diabetes mellitus (HCC)    Rx / DC Orders ED Discharge Orders          Ordered    metFORMIN (GLUCOPHAGE) 500 MG tablet  2 times daily with meals        01/08/23 1820    Ambulatory referral to Nutrition and Diabetic Education        01/08/23 1823    Blood Glucose Monitoring Suppl DEVI  3 times daily        01/08/23 1824    Glucose Blood (BLOOD GLUCOSE TEST STRIPS) STRP  3 times daily        01/08/23 1824    Lancet Device MISC  3 times daily        01/08/23 1824    Lancets Misc. MISC  3 times daily        01/08/23 1824              Josem Kaufmann 01/08/23 Rhea Bleacher, MD 01/09/23 1025

## 2023-01-08 NOTE — Discharge Instructions (Addendum)
It was a pleasure taking care of you today.  You were seen for high blood sugar.  You were given IV fluids to help with this.  Overall you do not need to stay in the hospital today but do need to change her diet.  Stop drinking soda, sweet tea, juice and other beverages with sugar in them.  Limit your intake of sweets such as candies, cookies, pastries.  Limit simple carbohydrates such as white pasta and bread and opt for whole-wheat.  Make sure you include plenty of vegetables and lean protein in your diet.  You need to call your primary care doctor's office on Monday for close follow-up.  They can follow-up on your A1c level and recheck your blood sugar.  Have started you on a medication to help control your blood sugar called metformin.  This can cause some nausea and diarrhea but the symptoms usually go away after you start taking it.  Your doctor may need to increase your dose.  This medication typically does not cause low blood sugars.  I have prescribed a tocometer with test strips.  It may be helpful to check your sugar in the morning before eating to see what your number is and record this so you can take it to your primary care doctor.

## 2023-01-08 NOTE — ED Triage Notes (Signed)
Pt reports he went for pre op lab work and was told his blood sugar is over 400 and he does not have a hx of diabetes.

## 2023-01-09 LAB — AFP TUMOR MARKER: AFP, Serum, Tumor Marker: 2.7 ng/mL (ref 0.0–6.9)

## 2023-01-11 ENCOUNTER — Telehealth (INDEPENDENT_AMBULATORY_CARE_PROVIDER_SITE_OTHER): Payer: Self-pay | Admitting: Gastroenterology

## 2023-01-11 NOTE — Telephone Encounter (Signed)
rom: Jethro Bolus, RN  Sent: 01/08/2023   3:53 PM EDT  To: Elinor Dodge, LPN; Nobie Putnam; *  Subject: cancellation                                   Patient is scheduled for EGD on 01/12/23. .... His blood sugar is 405 and he in not an established diabetic.  We will need him to follow up with his primary and then we can reschedule.

## 2023-01-11 NOTE — Telephone Encounter (Signed)
error 

## 2023-01-11 NOTE — Telephone Encounter (Signed)
Pt left voicemail in regards to if EGD is still on for tomorrow. Pt blood sugar at 12 pm 317. Returned call to patient and advised him that Lanier Prude is out of the office today. Advised him since his blood sugar is still elevated we would need to cancel and reschedule after seeing PCP. Pt verbalized understanding.

## 2023-01-11 NOTE — Telephone Encounter (Signed)
Jethro Bolus, RN  Marlowe Shores, LPN We will keep patient on the schedule until we hear back from Dr Tobie Lords.  Pt went to the ER on Friday for treatment.

## 2023-01-12 ENCOUNTER — Encounter (HOSPITAL_COMMUNITY): Admission: RE | Payer: Self-pay | Source: Home / Self Care

## 2023-01-12 ENCOUNTER — Ambulatory Visit (HOSPITAL_COMMUNITY)
Admission: RE | Admit: 2023-01-12 | Payer: BC Managed Care – PPO | Source: Home / Self Care | Admitting: Gastroenterology

## 2023-01-12 SURGERY — ESOPHAGOGASTRODUODENOSCOPY (EGD) WITH PROPOFOL
Anesthesia: Monitor Anesthesia Care

## 2023-01-12 NOTE — Telephone Encounter (Signed)
Please call patient to have him follow up in office with [place] after PCP. Pt was advised yesterday to follow up with PCP asap. Thank you

## 2023-01-13 ENCOUNTER — Ambulatory Visit (HOSPITAL_COMMUNITY): Payer: BC Managed Care – PPO | Attending: Gastroenterology

## 2023-06-18 IMAGING — CT CT ABDOMEN WO/W CM
3 of 12 series · 12 of 46 positions shown, 18 images · IV contrast (APPLIED)
Comparison: MR abdomen 03/23/2021 and CT abdomen pelvis 03/28/2021.

CLINICAL DATA: Cirrhosis, possible liver masses.

EXAM:
CT ABDOMEN WITHOUT AND WITH CONTRAST
TECHNIQUE: Multidetector CT imaging of the abdomen was performed following the
standard protocol before and following the bolus administration of
intravenous contrast.
CONTRAST:  75mL OMNIPAQUE IOHEXOL 300 MG/ML  SOLN

[Series 5: liver arterial · axial · arterial · 0.73mm/px · z∈[+896,+928]mm · 2 of 146 slices shown]
[im 17/146  soft-tissue]
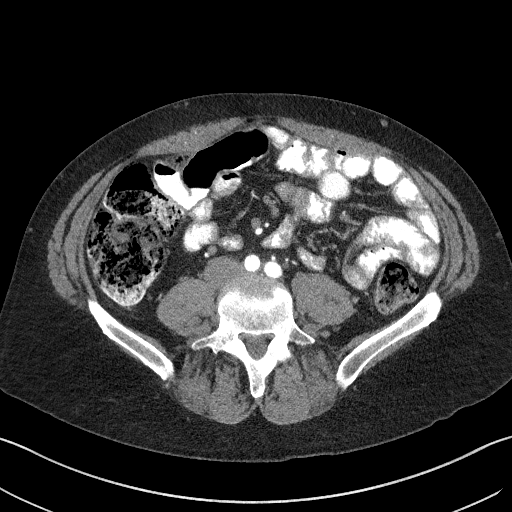
[im 33/146  soft-tissue]
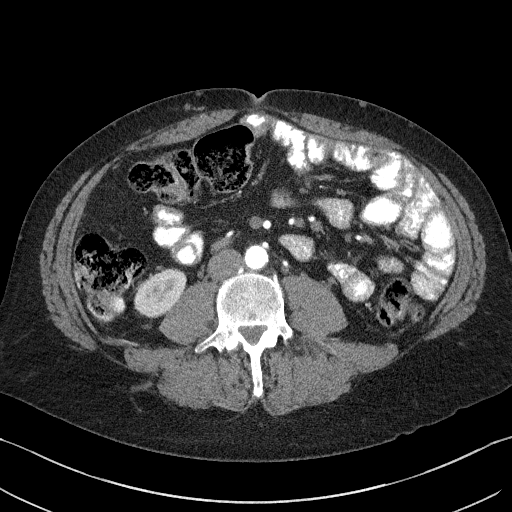

[Series 10: liver portal venous · axial · portal-venous · 0.73mm/px · z∈[+896,+1120]mm · 8 of 146 slices shown, 13 images]
[im 17/146  soft-tissue]
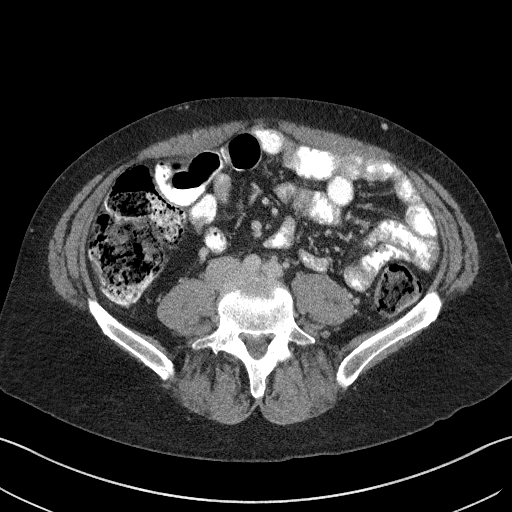
[im 17/146  bone]
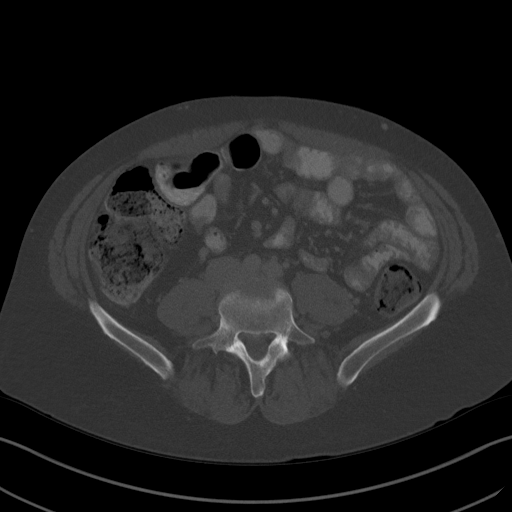
[im 33/146  soft-tissue]
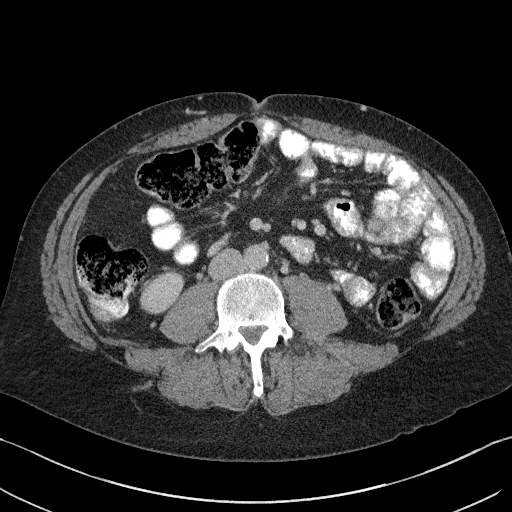
[im 49/146  soft-tissue]
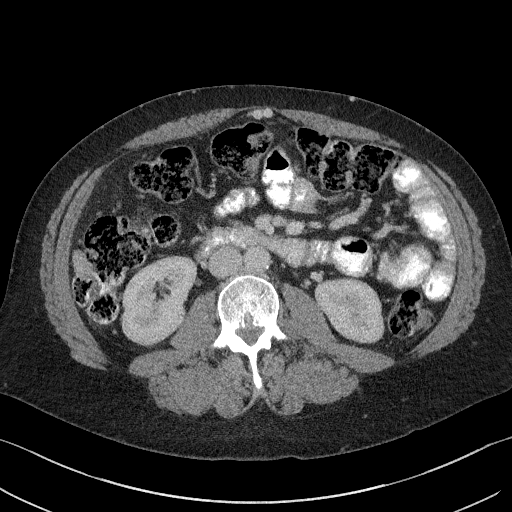
[im 65/146  soft-tissue]
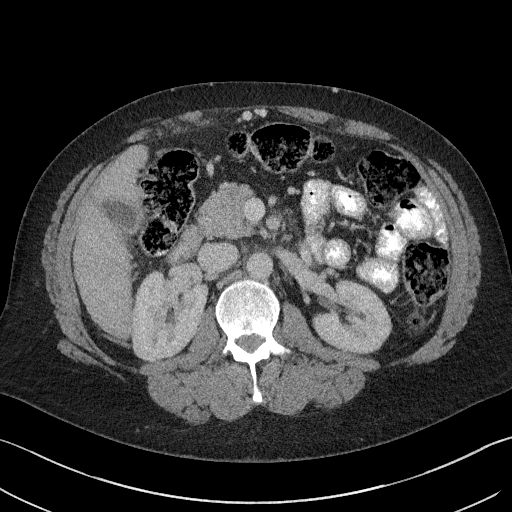
[im 81/146  soft-tissue]
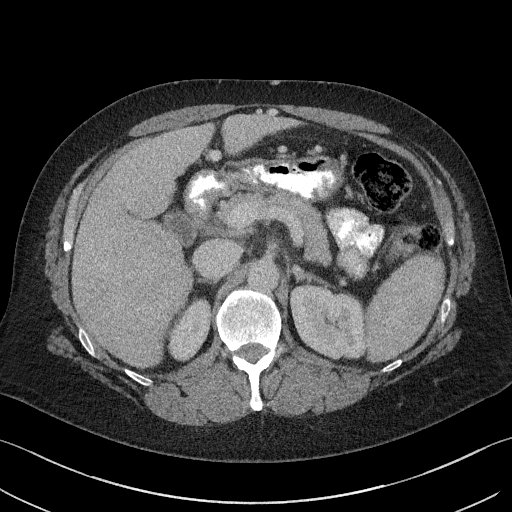
[im 81/146  lung]
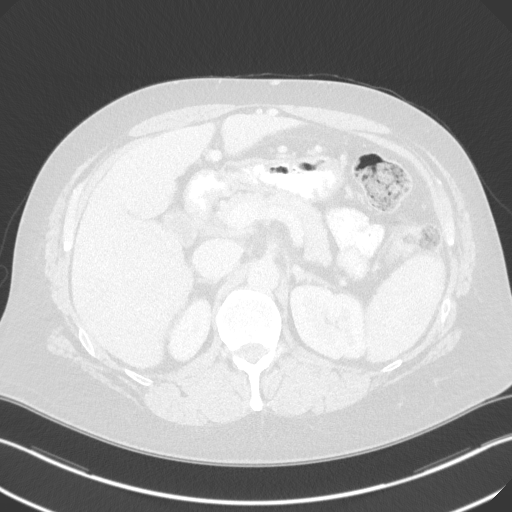
[im 97/146  soft-tissue]
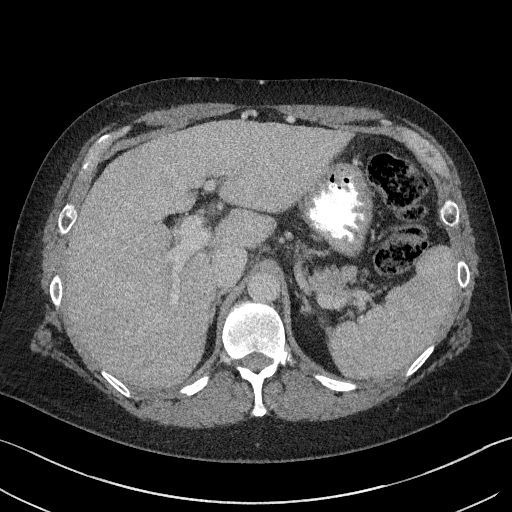
[im 97/146  lung]
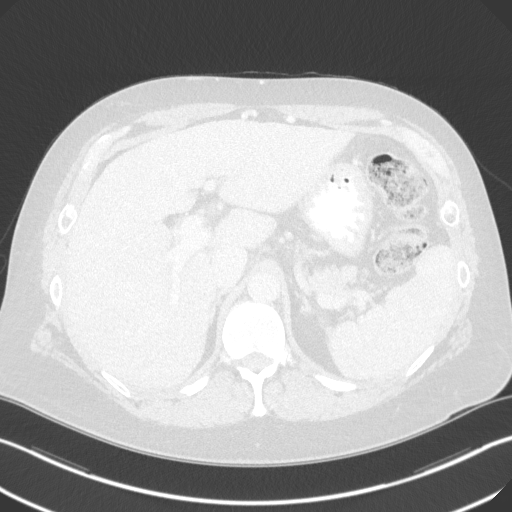
[im 113/146  soft-tissue]
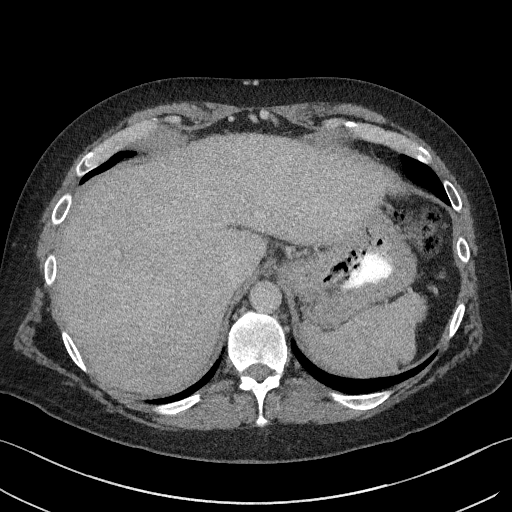
[im 113/146  lung]
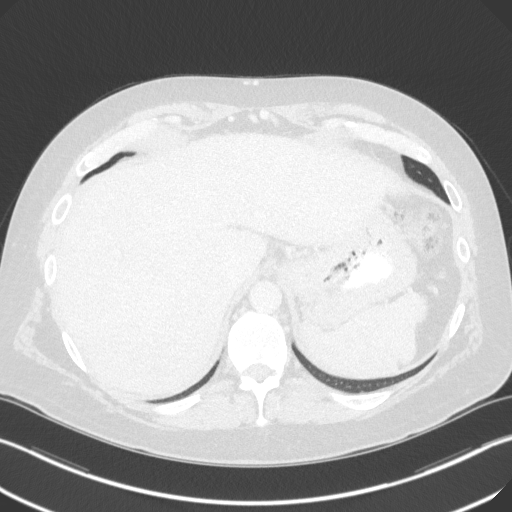
[im 129/146  soft-tissue]
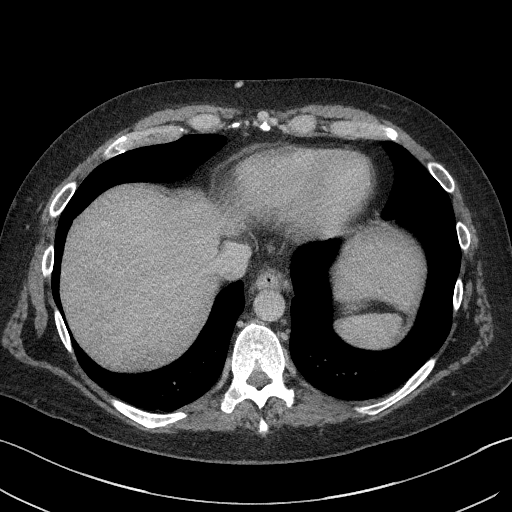
[im 129/146  lung]
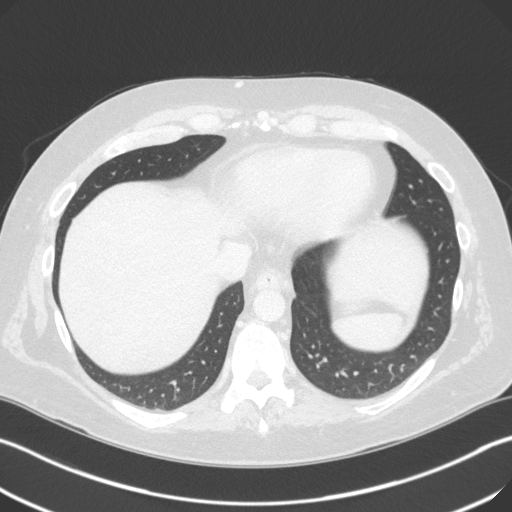

[Series 16: delay coronal · coronal · delayed · 0.51mm/px · 2 of 139 slices shown, 3 images]
[im 47/139  soft-tissue]
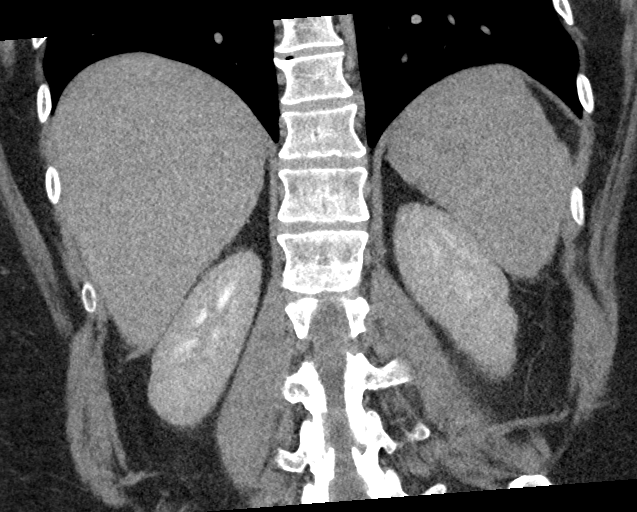
[im 47/139  bone]
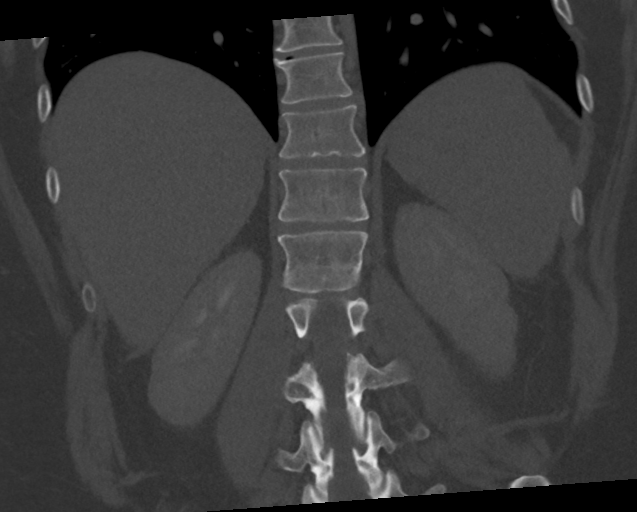
[im 93/139  soft-tissue]
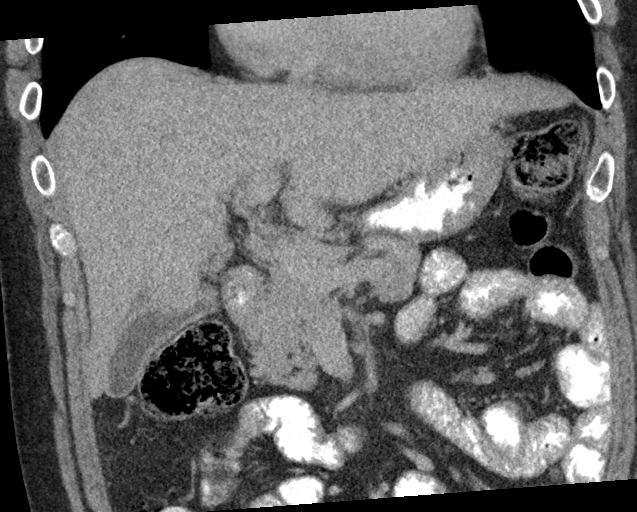

[12 of 46 positions shown; findings below may reference images not displayed]

FINDINGS: Lower chest: No acute findings. Heart size normal. No pericardial or
pleural effusion. Distal esophagus is unremarkable. There are
gastroesophageal varices.

Hepatobiliary: Liver is enlarged, 19.1 cm. No abnormal arterial
phase enhancement in the liver. Liver margin is irregular.
Gallbladder is unremarkable. There are adjacent varices. No biliary
ductal dilatation.

Pancreas: Negative.

Spleen: 12.4 cm, upper limits of normal.  Otherwise unremarkable.

Adrenals/Urinary Tract: Adrenal glands and kidneys are unremarkable.
No urinary stones.

Stomach/Bowel: Stomach and visualized portions of the small bowel
and colon are unremarkable.

Vascular/Lymphatic: Atherosclerotic calcification of the aorta.
Recanalized paraumbilical vein. Numerous varices. Retroperitoneal
lymph nodes are not enlarged by CT size criteria.

Other: No free fluid. Mesenteries and peritoneum are otherwise
unremarkable.

Musculoskeletal: No worrisome lytic or sclerotic lesions.
IMPRESSION: 1. Cirrhosis with portal hypertension. No definite arterial phase
enhancement to suggest hepatocellular carcinoma.
2.  Aortic atherosclerosis ([id number]-202.2).

## 2023-06-29 DIAGNOSIS — Z20822 Contact with and (suspected) exposure to covid-19: Secondary | ICD-10-CM | POA: Diagnosis not present

## 2023-06-29 DIAGNOSIS — J40 Bronchitis, not specified as acute or chronic: Secondary | ICD-10-CM | POA: Diagnosis not present

## 2023-06-29 DIAGNOSIS — R059 Cough, unspecified: Secondary | ICD-10-CM | POA: Diagnosis not present

## 2023-07-05 ENCOUNTER — Ambulatory Visit (INDEPENDENT_AMBULATORY_CARE_PROVIDER_SITE_OTHER): Payer: BC Managed Care – PPO | Admitting: Gastroenterology

## 2023-07-07 DIAGNOSIS — R059 Cough, unspecified: Secondary | ICD-10-CM | POA: Diagnosis not present

## 2023-07-07 DIAGNOSIS — R051 Acute cough: Secondary | ICD-10-CM | POA: Diagnosis not present

## 2023-07-07 DIAGNOSIS — J189 Pneumonia, unspecified organism: Secondary | ICD-10-CM | POA: Diagnosis not present

## 2023-07-07 DIAGNOSIS — J069 Acute upper respiratory infection, unspecified: Secondary | ICD-10-CM | POA: Diagnosis not present

## 2024-03-08 ENCOUNTER — Encounter (INDEPENDENT_AMBULATORY_CARE_PROVIDER_SITE_OTHER): Payer: Self-pay | Admitting: Gastroenterology

## 2024-04-11 ENCOUNTER — Inpatient Hospital Stay (HOSPITAL_COMMUNITY)
Admission: EM | Admit: 2024-04-11 | Discharge: 2024-04-14 | DRG: 637 | Disposition: A | Payer: MEDICAID | Attending: Internal Medicine | Admitting: Internal Medicine

## 2024-04-11 ENCOUNTER — Encounter (HOSPITAL_COMMUNITY): Payer: Self-pay | Admitting: Emergency Medicine

## 2024-04-11 ENCOUNTER — Other Ambulatory Visit: Payer: Self-pay

## 2024-04-11 ENCOUNTER — Emergency Department (HOSPITAL_COMMUNITY): Payer: MEDICAID

## 2024-04-11 DIAGNOSIS — K7689 Other specified diseases of liver: Secondary | ICD-10-CM | POA: Diagnosis present

## 2024-04-11 DIAGNOSIS — F10929 Alcohol use, unspecified with intoxication, unspecified: Secondary | ICD-10-CM | POA: Diagnosis present

## 2024-04-11 DIAGNOSIS — E1110 Type 2 diabetes mellitus with ketoacidosis without coma: Secondary | ICD-10-CM | POA: Diagnosis present

## 2024-04-11 DIAGNOSIS — E876 Hypokalemia: Secondary | ICD-10-CM | POA: Diagnosis present

## 2024-04-11 DIAGNOSIS — D696 Thrombocytopenia, unspecified: Secondary | ICD-10-CM | POA: Diagnosis present

## 2024-04-11 DIAGNOSIS — K7030 Alcoholic cirrhosis of liver without ascites: Principal | ICD-10-CM | POA: Diagnosis present

## 2024-04-11 DIAGNOSIS — K529 Noninfective gastroenteritis and colitis, unspecified: Secondary | ICD-10-CM

## 2024-04-11 LAB — CBC WITH DIFFERENTIAL/PLATELET
Abs Immature Granulocytes: 0.03 K/uL (ref 0.00–0.07)
Basophils Absolute: 0.1 K/uL (ref 0.0–0.1)
Basophils Relative: 1 %
Eosinophils Absolute: 0.1 K/uL (ref 0.0–0.5)
Eosinophils Relative: 1 %
HCT: 41.3 % (ref 39.0–52.0)
Hemoglobin: 14.7 g/dL (ref 13.0–17.0)
Immature Granulocytes: 0 %
Lymphocytes Relative: 10 %
Lymphs Abs: 0.7 K/uL (ref 0.7–4.0)
MCH: 35.5 pg — ABNORMAL HIGH (ref 26.0–34.0)
MCHC: 35.6 g/dL (ref 30.0–36.0)
MCV: 99.8 fL (ref 80.0–100.0)
Monocytes Absolute: 1.2 K/uL — ABNORMAL HIGH (ref 0.1–1.0)
Monocytes Relative: 18 %
Neutro Abs: 4.8 K/uL (ref 1.7–7.7)
Neutrophils Relative %: 70 %
Platelets: 104 K/uL — ABNORMAL LOW (ref 150–400)
RBC: 4.14 MIL/uL — ABNORMAL LOW (ref 4.22–5.81)
RDW: 15.2 % (ref 11.5–15.5)
WBC: 6.9 K/uL (ref 4.0–10.5)
nRBC: 0 % (ref 0.0–0.2)

## 2024-04-11 LAB — BLOOD GAS, VENOUS
Acid-Base Excess: 8.6 mmol/L — ABNORMAL HIGH (ref 0.0–2.0)
Bicarbonate: 31.6 mmol/L — ABNORMAL HIGH (ref 20.0–28.0)
Drawn by: 1528
O2 Saturation: 95.2 %
Patient temperature: 36.8
pCO2, Ven: 37 mmHg — ABNORMAL LOW (ref 44–60)
pH, Ven: 7.54 — ABNORMAL HIGH (ref 7.25–7.43)
pO2, Ven: 64 mmHg — ABNORMAL HIGH (ref 32–45)

## 2024-04-11 LAB — COMPREHENSIVE METABOLIC PANEL WITH GFR
ALT: 108 U/L — ABNORMAL HIGH (ref 0–44)
AST: 307 U/L — ABNORMAL HIGH (ref 15–41)
Albumin: 3.2 g/dL — ABNORMAL LOW (ref 3.5–5.0)
Alkaline Phosphatase: 249 U/L — ABNORMAL HIGH (ref 38–126)
Anion gap: 17 — ABNORMAL HIGH (ref 5–15)
BUN: <5 mg/dL — ABNORMAL LOW (ref 6–20)
CO2: 20 mmol/L — ABNORMAL LOW (ref 22–32)
Calcium: 8 mg/dL — ABNORMAL LOW (ref 8.9–10.3)
Chloride: 94 mmol/L — ABNORMAL LOW (ref 98–111)
Creatinine, Ser: 0.55 mg/dL — ABNORMAL LOW (ref 0.61–1.24)
GFR, Estimated: >60 mL/min (ref >60)
Glucose, Bld: 407 mg/dL — ABNORMAL HIGH (ref 70–99)
Potassium: 2.7 mmol/L — CL (ref 3.5–5.1)
Sodium: 132 mmol/L — ABNORMAL LOW (ref 135–145)
Total Bilirubin: 7.5 mg/dL — ABNORMAL HIGH (ref 0.0–1.2)
Total Protein: 8.1 g/dL (ref 6.5–8.1)

## 2024-04-11 LAB — LIPASE, BLOOD: Lipase: 84 U/L — ABNORMAL HIGH (ref 11–51)

## 2024-04-11 LAB — GLUCOSE, CAPILLARY: Glucose-Capillary: 206 mg/dL — ABNORMAL HIGH (ref 70–99)

## 2024-04-11 LAB — ETHANOL: Alcohol, Ethyl (B): 207 mg/dL — ABNORMAL HIGH (ref <15)

## 2024-04-11 LAB — MAGNESIUM: Magnesium: 2 mg/dL (ref 1.7–2.4)

## 2024-04-11 LAB — BETA-HYDROXYBUTYRIC ACID: Beta-Hydroxybutyric Acid: 0.14 mmol/L (ref 0.05–0.27)

## 2024-04-11 MED ORDER — DEXTROSE 50 % IV SOLN: 0.0000 mL | INTRAVENOUS | Status: DC | PRN

## 2024-04-11 MED ORDER — CHLORHEXIDINE GLUCONATE CLOTH 2 % EX PADS
6.0000 | MEDICATED_PAD | Freq: Every day | CUTANEOUS | Status: DC
  Administered 2024-04-11 – 2024-04-13 (×2): 6 via TOPICAL

## 2024-04-11 MED ORDER — ADULT MULTIVITAMIN W/MINERALS CH
1.0000 | ORAL_TABLET | Freq: Every day | ORAL | Status: DC
  Administered 2024-04-12 – 2024-04-14 (×3): 1 via ORAL
  Filled 2024-04-11 (×3): qty 1

## 2024-04-11 MED ORDER — INSULIN REGULAR(HUMAN) IN NACL 100-0.9 UT/100ML-% IV SOLN
INTRAVENOUS | Status: DC
  Filled 2024-04-11: qty 100

## 2024-04-11 MED ORDER — SODIUM CHLORIDE 0.9 % IV SOLN
2.0000 g | INTRAVENOUS | Status: DC
  Administered 2024-04-11: 2 g via INTRAVENOUS
  Filled 2024-04-11: qty 20

## 2024-04-11 MED ORDER — THIAMINE MONONITRATE 100 MG PO TABS
100.0000 mg | ORAL_TABLET | Freq: Every day | ORAL | Status: DC
  Administered 2024-04-12 – 2024-04-14 (×3): 100 mg via ORAL
  Filled 2024-04-11 (×3): qty 1

## 2024-04-11 MED ORDER — IOHEXOL 300 MG/ML  SOLN
100.0000 mL | Freq: Once | INTRAMUSCULAR | Status: AC | PRN
  Administered 2024-04-11: 100 mL via INTRAVENOUS

## 2024-04-11 MED ORDER — ENOXAPARIN SODIUM 40 MG/0.4ML IJ SOSY
40.0000 mg | PREFILLED_SYRINGE | INTRAMUSCULAR | Status: DC
  Administered 2024-04-11: 40 mg via SUBCUTANEOUS
  Filled 2024-04-11: qty 0.4

## 2024-04-11 MED ORDER — METRONIDAZOLE 500 MG/100ML IV SOLN
500.0000 mg | Freq: Two times a day (BID) | INTRAVENOUS | Status: DC
  Administered 2024-04-11 – 2024-04-14 (×6): 500 mg via INTRAVENOUS
  Filled 2024-04-11 (×6): qty 100

## 2024-04-11 MED ORDER — THIAMINE HCL 100 MG/ML IJ SOLN: 100.0000 mg | Freq: Every day | INTRAMUSCULAR | Status: DC

## 2024-04-11 MED ORDER — FOLIC ACID 1 MG PO TABS
1.0000 mg | ORAL_TABLET | Freq: Every day | ORAL | Status: DC
  Administered 2024-04-12 – 2024-04-14 (×3): 1 mg via ORAL
  Filled 2024-04-11 (×3): qty 1

## 2024-04-11 MED ORDER — POTASSIUM CHLORIDE 10 MEQ/100ML IV SOLN
10.0000 meq | Freq: Once | INTRAVENOUS | Status: AC
  Administered 2024-04-11: 10 meq via INTRAVENOUS
  Filled 2024-04-11: qty 100

## 2024-04-11 MED ORDER — POTASSIUM CHLORIDE CRYS ER 20 MEQ PO TBCR
40.0000 meq | EXTENDED_RELEASE_TABLET | Freq: Once | ORAL | Status: AC
  Administered 2024-04-11: 40 meq via ORAL
  Filled 2024-04-11: qty 2

## 2024-04-11 MED ORDER — LACTATED RINGERS IV BOLUS
20.0000 mL/kg | Freq: Once | INTRAVENOUS | Status: AC
  Administered 2024-04-11: 1724 mL via INTRAVENOUS

## 2024-04-11 MED ORDER — LORAZEPAM 2 MG/ML IJ SOLN
1.0000 mg | INTRAMUSCULAR | Status: DC | PRN
  Administered 2024-04-13: 1 mg via INTRAVENOUS
  Filled 2024-04-11: qty 1

## 2024-04-11 MED ORDER — DEXTROSE IN LACTATED RINGERS 5 % IV SOLN: INTRAVENOUS | Status: DC

## 2024-04-11 MED ORDER — PANTOPRAZOLE SODIUM 40 MG IV SOLR
40.0000 mg | INTRAVENOUS | Status: DC
  Administered 2024-04-11 – 2024-04-12 (×2): 40 mg via INTRAVENOUS
  Filled 2024-04-11 (×2): qty 10

## 2024-04-11 MED ORDER — LACTATED RINGERS IV SOLN: INTRAVENOUS | Status: AC

## 2024-04-11 MED ORDER — POTASSIUM CHLORIDE 10 MEQ/100ML IV SOLN
10.0000 meq | INTRAVENOUS | Status: AC
  Administered 2024-04-11 (×2): 10 meq via INTRAVENOUS
  Filled 2024-04-11 (×2): qty 100

## 2024-04-11 MED ORDER — LORAZEPAM 1 MG PO TABS
1.0000 mg | ORAL_TABLET | ORAL | Status: DC | PRN
  Administered 2024-04-13: 1 mg via ORAL
  Filled 2024-04-11: qty 1

## 2024-04-11 NOTE — H&P (Addendum)
 History and Physical    Craig Andrews FMW:[id number] DOB: 10-22-1980 DOA: 04/11/2024  PCP: Harl Jayson CROME, MD (Inactive)   Patient coming from: Home  I have personally briefly reviewed patient's old medical records in [facility]  Chief Complaint: Abdominal Pain  HPI: Craig Andrews is a 43 y.o. male with medical history significant for alcoholic liver cirrhosis, esophageal varices, spontaneous bacterial peritonitis, diabetes mellitus. Patient presented to the ED with complaints of lower abdominal pain, yellowing of his skin.  No nausea no vomiting, no diarrhea.  Ongoing alcohol abuse, last drink was earlier today at about 2:30 PM.  He drinks vodka, reports he is trying to cut down.  He reports 2 episodes of black stools about a week ago.  Reports he lost his insurance, so he has not been able to get or take his medications which include nadolol , metformin , spironolactone .  ED Course: Temperature 98.2.  Heart rate 97-104.  Respirate rate 19-20.  Blood pressure systolic 120-137.  O2 sats 91 to 96% on room air. Sodium 132, potassium 2.7.  Anion gap of 17, serum bicarb of 20.  Blood glucose 407. AST 307, ALT 108, ALP 249, T. bili of 7.5. Blood alcohol level 207. CT abdomen pelvis with contrast -cirrhotic liver, numerous hypodense liver nodules MRI recommended to exclude underlying hepatocellular carcinoma.  Extensive upper abdominal varices.  Diffuse colonic wall thickening nonspecific-infectious/inflammatory colitis versus portal colopathy.  Distended gallbladder-no evidence of cholelithiasis or cholecystitis. GI consulted, recommended checking AFP, will send consult in a.m.  Review of Systems: As per HPI all other systems reviewed and negative.  Past Medical History:  Diagnosis Date   Allergy    Cirrhosis (HCC)    Hypertension     Past Surgical History:  Procedure Laterality Date   ESOPHAGEAL BANDING  04/01/2021   Procedure: ESOPHAGEAL BANDING;  Surgeon: Eartha Flavors,  Toribio, MD;  [facility]: [facility];  Service: Gastroenterology;;   ESOPHAGOGASTRODUODENOSCOPY (EGD) WITH PROPOFOL  N/A 04/01/2021   Procedure: ESOPHAGOGASTRODUODENOSCOPY (EGD) WITH PROPOFOL ;  Surgeon: Eartha Flavors Toribio, MD;  Location: [facility];  Service: Gastroenterology;  Laterality: N/A;   ESOPHAGOGASTRODUODENOSCOPY (EGD) WITH PROPOFOL  N/A 04/29/2021   Procedure: ESOPHAGOGASTRODUODENOSCOPY (EGD) WITH PROPOFOL ;  Surgeon: Eartha Flavors Toribio, MD;  Location: [facility];  Service: Gastroenterology;  Laterality: N/A;  8:05   ESOPHAGOGASTRODUODENOSCOPY (EGD) WITH PROPOFOL  N/A 10/24/2021   Procedure: ESOPHAGOGASTRODUODENOSCOPY (EGD) WITH PROPOFOL ;  Surgeon: Eartha Flavors Toribio, MD;  Location: [facility];  Service: Gastroenterology;  Laterality: N/A;  1035   FRACTURE SURGERY     rod inside leg       reports that he has been smoking cigarettes and cigars. He has been exposed to tobacco smoke. He has never used smokeless tobacco. He reports current alcohol use. He reports current drug use. Frequency: 2.00 times per week. Drug: Marijuana.  No Known Allergies  Family history of hypertension  Prior to Admission medications   Medication Sig Start Date End Date Taking? Authorizing Provider  cetirizine (ZYRTEC) 10 MG tablet Take 10 mg by mouth at bedtime.   Yes [provider]  diphenhydrAMINE (BENADRYL) 25 MG tablet Take 25 mg by mouth every 6 (six) hours as needed for allergies or sleep.   Yes [provider]  diphenhydramine-acetaminophen  (TYLENOL  PM) 25-500 MG TABS tablet Take 2 tablets by mouth at bedtime as needed (sleep).   Yes [provider]  ibuprofen  (ADVIL ) 200 MG tablet Take 400 mg by mouth every 6 (six) hours as needed for fever.  Yes [provider]  lactulose  (CHRONULAC ) 10 GM/15ML solution TAKE 15 ML BY MOUTH  TWICE DAILY Patient taking differently: Take 15 mLs by mouth 2 (two) times daily as needed for mild constipation.  08/26/22  Yes Carlan, Chelsea L, NP  Multiple Vitamin (MULTIVITAMIN WITH MINERALS) TABS tablet Take 1 tablet by mouth daily.   Yes [provider]  omeprazole  (PRILOSEC  OTC) 20 MG tablet Take 1 tablet (20 mg total) by mouth 2 (two) times daily. Patient taking differently: Take 20 mg by mouth daily. 08/06/21 04/11/24 Yes Carlan, Chelsea L, NP  Blood Glucose Monitoring Suppl DEVI 1 each by Does not apply route in the morning, at noon, and at bedtime. May substitute to any manufacturer covered by patient's insurance. 01/08/23   Suellen Cantor A, PA-C  folic acid  (FOLVITE ) 1 MG tablet Take 1 tablet by mouth once daily Patient not taking: Reported on 04/11/2024 10/06/22   Carlan, Chelsea L, NP  furosemide  (LASIX ) 20 MG tablet TAKE 1 TABLET BY MOUTH ONCE DAILY IN THE MORNING Patient not taking: Reported on 04/11/2024 12/31/22   Carlan, Chelsea L, NP  metFORMIN  (GLUCOPHAGE ) 500 MG tablet Take 1 tablet (500 mg total) by mouth 2 (two) times daily with a meal. Patient not taking: Reported on 04/11/2024 01/08/23   Suellen Cantor A, PA-C  nadolol  (CORGARD ) 40 MG tablet Take 1 tablet (40 mg total) by mouth 2 (two) times daily. Patient not taking: Reported on 04/11/2024 12/31/22   Mariette Mitzie CROME, NP  spironolactone  (ALDACTONE ) 50 MG tablet Take 1 tablet by mouth once daily Patient not taking: Reported on 04/11/2024 08/26/22   Mariette Mitzie CROME, NP    Physical Exam: Vitals:   04/11/24 1657 04/11/24 1659 04/11/24 1752 04/11/24 1930  BP: (!) 137/102 (!) 137/102  120/78  Pulse: (!) 104 (!) 104  99  Resp: 19   20  Temp: 98.2 F (36.8 C)     TempSrc: Oral     SpO2: 91%  96% 92%  Weight: 86.2 kg     Height: 5' 11 (1.803 m)       Constitutional: NAD, calm, comfortable Vitals:   04/11/24 1657 04/11/24 1659 04/11/24 1752 04/11/24 1930  BP: (!) 137/102 (!) 137/102  120/78  Pulse: (!) 104 (!) 104  99  Resp: 19   20  Temp: 98.2 F (36.8 C)     TempSrc: Oral     SpO2: 91%  96% 92%  Weight: 86.2 kg      Height: 5' 11 (1.803 m)      Eyes: PERRL, lids and conjunctivae normal ENMT: Mucous membranes are moist. Posterior pharynx clear of any exudate or lesions.Normal dentition.  Neck: normal, supple, no masses, no thyromegaly Respiratory: clear to auscultation bilaterally, no wheezing, no crackles. Normal respiratory effort. No accessory muscle use.  Cardiovascular: Regular rate and rhythm, no murmurs / rubs / gallops. No extremity edema. 2+ pedal pulses. No carotid bruits.  Abdomen: no tenderness, no masses palpated. No hepatosplenomegaly. Bowel sounds positive.  Musculoskeletal: no clubbing / cyanosis. No joint deformity upper and lower extremities. Good ROM, no contractures. Normal muscle tone.  Skin: no rashes, lesions, ulcers. No induration Neurologic: CN 2-12 grossly intact. Sensation intact, DTR normal. Strength 5/5 in all 4.  Psychiatric: Normal judgment and insight. Alert and oriented x 3. Normal mood.   Labs on Admission: I have personally reviewed following labs and imaging studies  CBC: Recent Labs  Lab 04/11/24 1723  WBC 6.9  NEUTROABS 4.8  HGB 14.7  HCT 41.3  MCV 99.8  PLT 104*   Basic Metabolic Panel: Recent Labs  Lab 04/11/24 1723  NA 132*  K 2.7*  CL 94*  CO2 20*  GLUCOSE 407*  BUN <5*  CREATININE 0.55*  CALCIUM 8.0*   GFR: Estimated Creatinine Clearance: 128.1 mL/min (A) (by C-G formula based on SCr of 0.55 mg/dL (L)). Liver Function Tests: Recent Labs  Lab 04/11/24 1723  AST 307*  ALT 108*  ALKPHOS 249*  BILITOT 7.5*  PROT 8.1  ALBUMIN  3.2*   Recent Labs  Lab 04/11/24 1723  LIPASE 84*   Urine analysis:    Component Value Date/Time   COLORURINE YELLOW 01/08/2023 0838   APPEARANCEUR CLEAR 01/08/2023 0838   LABSPEC 1.028 01/08/2023 0838   PHURINE 6.0 01/08/2023 0838   GLUCOSEU >=500 (A) 01/08/2023 0838   HGBUR NEGATIVE 01/08/2023 0838   BILIRUBINUR NEGATIVE 01/08/2023 0838   KETONESUR 5 (A) 01/08/2023 0838   PROTEINUR NEGATIVE  01/08/2023 0838   NITRITE NEGATIVE 01/08/2023 0838   LEUKOCYTESUR NEGATIVE 01/08/2023 0838    Radiological Exams on Admission: CT ABDOMEN PELVIS W CONTRAST Result Date: 04/11/2024 CLINICAL DATA:  Cirrhosis, jaundice, abdominal pain EXAM: CT ABDOMEN AND PELVIS WITH CONTRAST TECHNIQUE: Multidetector CT imaging of the abdomen and pelvis was performed using the standard protocol following bolus administration of intravenous contrast. RADIATION DOSE REDUCTION: This exam was performed according to the departmental dose-optimization program which includes automated exposure control, adjustment of the mA and/or kV according to patient size and/or use of iterative reconstruction technique. CONTRAST:  OMNIPAQUE  IOHEXOL  300 MG/ML  SOLN COMPARISON:  04/16/2021 FINDINGS: Lower chest: No acute pleural or parenchymal lung disease. Hepatobiliary: There is marked heterogeneity of the liver parenchyma, compatible with cirrhosis. Numerous hypodense nodules are seen throughout the liver, indeterminate on this single phase exam. Correlation with nonemergent outpatient dedicated liver MRI with without contrast is recommended for further characterization and to assess for underlying malignancy. No biliary duct dilation. Marked distension of the gallbladder without evidence of cholelithiasis or cholecystitis. Pancreas: Unremarkable. No pancreatic ductal dilatation or surrounding inflammatory changes. Spleen: Normal in size without focal abnormality. Adrenals/Urinary Tract: Adrenal glands are unremarkable. Kidneys are normal, without renal calculi, focal lesion, or hydronephrosis. Bladder is unremarkable. Stomach/Bowel: No bowel obstruction or ileus. There is nonspecific diffuse colonic wall thickening, most pronounced within the cecum and ascending colon. Findings could reflect inflammatory or infectious colitis, though portal colopathy could give a similar appearance given the presence of cirrhosis. Normal appendix right  lower quadrant. Vascular/Lymphatic: Atherosclerosis of the abdominal aorta. There is evidence of portal venous hypertension, with enlarged main portal vein, recanalization of the umbilical vein, as well as numerous upper abdominal varices primarily within the stomach and distal esophagus. No pathologic adenopathy. Reproductive: Prostate is unremarkable. Other: Trace ascites. No free intraperitoneal gas. No abdominal wall hernia. Musculoskeletal: No acute or destructive bony abnormalities. Reconstructed images demonstrate no additional findings. IMPRESSION: 1. Cirrhotic morphology of the liver, with numerous hypodense nodules throughout the liver parenchyma. When the patient is clinically stable and able to follow directions and hold their breath (preferably as an outpatient) further evaluation with dedicated abdominal MRI should be considered for further characterization and to exclude underlying hepatocellular carcinoma. 2. Portal venous hypertension, manifested by trace ascites, recanalization of the umbilical vein, and extensive upper abdominal varices. 3. Diffuse colonic wall thickening, nonspecific. Differential diagnosis would include inflammatory/infectious colitis versus portal colopathy. 4. Distended gallbladder, with no evidence of cholelithiasis or cholecystitis. 5.  Aortic Atherosclerosis (ICD10-I70.0). Electronically Signed  By: Ozell Daring M.D.   On: 04/11/2024 19:18   EKG: Independently reviewed.   Assessment/Plan Principal Problem:   Hypokalemia Active Problems:   Numerous liver nodules   DKA (diabetic ketoacidosis) (HCC)   Colitis   Alcoholic cirrhosis of liver without ascites (HCC)   Alcohol intoxication   Thrombocytopenia   Assessment and Plan:  DKA -blood glucose 407, anion gap of 17, serum bicarb of 20.  Appears to be early DKA.  Home medications include metformin .  Alcoholic ketoacidosis likely contributing to anion gap acidosis. - HgbA1c - Will give 1 L fluid bolus,  continue LR at 100 cc/h - Start insulin  drip - DKA protocol - Check beta hydroxybutyrate acid - VBG-pH of 7.5, pCO2 of 37. - Noncaloric intake for now  Colitis-presenting with abdominal pain, no diarrhea.  Afebrile.  WBC 6.9.  Rules out for sepsis.  CT shows colonic wall thickening -infectious/inflammatory colitis versus portal colopathy. - IV ceftriaxone  and metronidazole   Hypokalemia-potassium 2.7, in the setting of heavy chronic alcohol abuse. - Check magnesium   Alcohol intoxication-blood alcohol level 207.  No sign of withdrawal at this time.  Last alcoholic beverage was 30 p.m. today, he drinks vodka.  Alcoholic ketoacidosis may be contributing to anion gap acidosis.  Reports history of withdrawal, but no seizures. - CIWA as needed - Thiamine , folate, multivitamins - I have counseled patient extensively on quitting alcohol abuse, he verbalized understanding.  New liver lesions, elevated liver enzymes-AST 307, ALT 108, ALP 249, T. bili 7.5.  CT shows numerous hypodense nodules throughout the liver parenchyma, MRI recommended to exclude underlying hepatocellular cancer.  Last hepatitis panel 2022 positive for hepatitis A antibodies , negative hepatitis B - GI to see in consult in a.m. - AFP ordered and pending - Trend Liver enzymes - Hepatitis panel in a.m  Liver cirrhosis, portal hypertension with esophageal varices, thrombocytopenia-platelets 104.  CT shows portal venous hypertension with trace ascites, extensive upper abdominal varices.  He reports 2 episodes of black stools a week ago.  Hemoglobin stable at 14.7. - IV Protonix  40 daily - Hold pharmacologic DVT prophylaxis for now   DVT prophylaxis: SCDS Code Status: Full code Family Communication: Significant other - Linda - at bedside. Disposition Plan: > 2 days Consults called: GI Admission status:  Inpt stepdown I certify that at the point of admission it is my clinical judgment that the patient will require inpatient  hospital care spanning beyond 2 midnights from the point of admission due to high intensity of service, high risk for further deterioration and high frequency of surveillance required.   CRITICAL CARE Performed by: Tully FORBES Carwin   Total critical care time: 70 minutes  Critical care time was exclusive of separately billable procedures and treating other patients.  Critical care was necessary to treat or prevent imminent or life-threatening deterioration.  Critical care was time spent personally by me on the following activities: development of treatment plan with patient and/or surrogate as well as nursing, discussions with consultants, evaluation of patient's response to treatment, examination of patient, obtaining history from patient or surrogate, ordering and performing treatments and interventions, ordering and review of laboratory studies, ordering and review of radiographic studies, pulse oximetry and re-evaluation of patient's condition.   Author: Tully FORBES Carwin, MD 04/11/2024 10:35 PM  For on call review [email or web address].

## 2024-04-11 NOTE — ED Triage Notes (Signed)
 PT states he has cirrhosis of the liver and has continued to drink. Noticed skin yellowing a few days ago and pain in abd. Denies nausea and vomiting. States he is going to quit drinking and denies needing help. Pt states he understands symptoms of withdrawal. Last drink was earlier today.

## 2024-04-12 ENCOUNTER — Encounter (HOSPITAL_COMMUNITY): Payer: Self-pay | Admitting: Internal Medicine

## 2024-04-12 ENCOUNTER — Inpatient Hospital Stay (HOSPITAL_COMMUNITY): Payer: MEDICAID

## 2024-04-12 DIAGNOSIS — R748 Abnormal levels of other serum enzymes: Secondary | ICD-10-CM

## 2024-04-12 DIAGNOSIS — F10188 Alcohol abuse with other alcohol-induced disorder: Secondary | ICD-10-CM

## 2024-04-12 DIAGNOSIS — K7010 Alcoholic hepatitis without ascites: Secondary | ICD-10-CM

## 2024-04-12 LAB — MRSA NEXT GEN BY PCR, NASAL: MRSA by PCR Next Gen: NOT DETECTED

## 2024-04-12 LAB — HEMOGLOBIN A1C
Hgb A1c MFr Bld: 7.3 % — ABNORMAL HIGH (ref 4.8–5.6)
Mean Plasma Glucose: 162.81 mg/dL

## 2024-04-12 LAB — CBC
HCT: 38.9 % — ABNORMAL LOW (ref 39.0–52.0)
Hemoglobin: 13.8 g/dL (ref 13.0–17.0)
MCH: 35.3 pg — ABNORMAL HIGH (ref 26.0–34.0)
MCHC: 35.5 g/dL (ref 30.0–36.0)
MCV: 99.5 fL (ref 80.0–100.0)
Platelets: 93 K/uL — ABNORMAL LOW (ref 150–400)
RBC: 3.91 MIL/uL — ABNORMAL LOW (ref 4.22–5.81)
RDW: 15.4 % (ref 11.5–15.5)
WBC: 7 K/uL (ref 4.0–10.5)
nRBC: 0 % (ref 0.0–0.2)

## 2024-04-12 LAB — BASIC METABOLIC PANEL WITH GFR
Anion gap: 13 (ref 5–15)
BUN: <5 mg/dL — ABNORMAL LOW (ref 6–20)
CO2: 24 mmol/L (ref 22–32)
Calcium: 7.7 mg/dL — ABNORMAL LOW (ref 8.9–10.3)
Chloride: 99 mmol/L (ref 98–111)
Creatinine, Ser: 0.53 mg/dL — ABNORMAL LOW (ref 0.61–1.24)
GFR, Estimated: >60 mL/min (ref >60)
Glucose, Bld: 141 mg/dL — ABNORMAL HIGH (ref 70–99)
Potassium: 2.9 mmol/L — ABNORMAL LOW (ref 3.5–5.1)
Sodium: 135 mmol/L (ref 135–145)

## 2024-04-12 LAB — FOLATE: Folate: 10.8 ng/mL (ref >5.9)

## 2024-04-12 LAB — HEPATIC FUNCTION PANEL
ALT: 99 U/L — ABNORMAL HIGH (ref 0–44)
AST: 307 U/L — ABNORMAL HIGH (ref 15–41)
Albumin: 2.9 g/dL — ABNORMAL LOW (ref 3.5–5.0)
Alkaline Phosphatase: 225 U/L — ABNORMAL HIGH (ref 38–126)
Bilirubin, Direct: 6.1 mg/dL — ABNORMAL HIGH (ref 0.0–0.2)
Indirect Bilirubin: 1.5 mg/dL — ABNORMAL HIGH (ref 0.3–0.9)
Total Bilirubin: 7.6 mg/dL — ABNORMAL HIGH (ref 0.0–1.2)
Total Protein: 7.5 g/dL (ref 6.5–8.1)

## 2024-04-12 LAB — GLUCOSE, CAPILLARY
Glucose-Capillary: 168 mg/dL — ABNORMAL HIGH (ref 70–99)
Glucose-Capillary: 179 mg/dL — ABNORMAL HIGH (ref 70–99)
Glucose-Capillary: 179 mg/dL — ABNORMAL HIGH (ref 70–99)
Glucose-Capillary: 182 mg/dL — ABNORMAL HIGH (ref 70–99)
Glucose-Capillary: 185 mg/dL — ABNORMAL HIGH (ref 70–99)

## 2024-04-12 LAB — VITAMIN B12: Vitamin B-12: 2939 pg/mL — ABNORMAL HIGH (ref 180–914)

## 2024-04-12 LAB — HIV ANTIBODY (ROUTINE TESTING W REFLEX): HIV Screen 4th Generation wRfx: NONREACTIVE

## 2024-04-12 LAB — MAGNESIUM: Magnesium: 1.8 mg/dL (ref 1.7–2.4)

## 2024-04-12 LAB — PROTIME-INR
INR: 1.3 — ABNORMAL HIGH (ref 0.8–1.2)
Prothrombin Time: 16.6 s — ABNORMAL HIGH (ref 11.4–15.2)

## 2024-04-12 MED ORDER — POLYETHYLENE GLYCOL 3350 17 G PO PACK: 17.0000 g | PACK | Freq: Every day | ORAL | Status: DC | PRN

## 2024-04-12 MED ORDER — INSULIN ASPART 100 UNIT/ML IJ SOLN
0.0000 [IU] | Freq: Every day | INTRAMUSCULAR | Status: DC
  Administered 2024-04-13: 2 [IU] via SUBCUTANEOUS
  Filled 2024-04-12: qty 1

## 2024-04-12 MED ORDER — INSULIN ASPART 100 UNIT/ML IJ SOLN
0.0000 [IU] | Freq: Three times a day (TID) | INTRAMUSCULAR | Status: DC
  Administered 2024-04-12 (×3): 3 [IU] via SUBCUTANEOUS
  Administered 2024-04-13: 2 [IU] via SUBCUTANEOUS
  Administered 2024-04-14: 3 [IU] via SUBCUTANEOUS
  Filled 2024-04-12 (×5): qty 1

## 2024-04-12 MED ORDER — INSULIN GLARGINE 100 UNIT/ML ~~LOC~~ SOLN
10.0000 [IU] | Freq: Two times a day (BID) | SUBCUTANEOUS | Status: DC
  Administered 2024-04-12: 10 [IU] via SUBCUTANEOUS
  Filled 2024-04-12 (×2): qty 0.1

## 2024-04-12 MED ORDER — INSULIN GLARGINE 100 UNIT/ML ~~LOC~~ SOLN
7.0000 [IU] | Freq: Two times a day (BID) | SUBCUTANEOUS | Status: DC
  Filled 2024-04-12 (×2): qty 0.07

## 2024-04-12 MED ORDER — GADOBUTROL 1 MMOL/ML IV SOLN
9.0000 mL | Freq: Once | INTRAVENOUS | Status: AC | PRN
  Administered 2024-04-12: 9 mL via INTRAVENOUS

## 2024-04-12 MED ORDER — ACETAMINOPHEN 500 MG PO TABS
1000.0000 mg | ORAL_TABLET | Freq: Three times a day (TID) | ORAL | Status: DC
  Administered 2024-04-12: 1000 mg via ORAL
  Filled 2024-04-12: qty 2

## 2024-04-12 MED ORDER — POTASSIUM CHLORIDE 20 MEQ PO PACK
40.0000 meq | PACK | ORAL | Status: AC
  Administered 2024-04-12 (×2): 40 meq via ORAL
  Filled 2024-04-12 (×2): qty 2

## 2024-04-12 MED ORDER — SODIUM CHLORIDE 0.9 % IV SOLN
2.0000 g | INTRAVENOUS | Status: DC
  Administered 2024-04-12 – 2024-04-13 (×2): 2 g via INTRAVENOUS
  Filled 2024-04-12 (×2): qty 20

## 2024-04-12 MED ORDER — INSULIN STARTER KIT- PEN NEEDLES (ENGLISH)
1.0000 | Freq: Once | Status: AC
  Administered 2024-04-12: 1
  Filled 2024-04-12: qty 1

## 2024-04-12 MED ORDER — PROCHLORPERAZINE EDISYLATE 10 MG/2ML IJ SOLN: 5.0000 mg | Freq: Four times a day (QID) | INTRAMUSCULAR | Status: DC | PRN

## 2024-04-12 MED ORDER — INSULIN GLARGINE-YFGN 100 UNIT/ML ~~LOC~~ SOLN
7.0000 [IU] | Freq: Two times a day (BID) | SUBCUTANEOUS | Status: DC
  Administered 2024-04-12: 7 [IU] via SUBCUTANEOUS
  Filled 2024-04-12 (×4): qty 0.07

## 2024-04-12 MED ORDER — INSULIN GLARGINE 100 UNIT/ML ~~LOC~~ SOLN
7.0000 [IU] | Freq: Two times a day (BID) | SUBCUTANEOUS | Status: DC
  Administered 2024-04-12: 7 [IU] via SUBCUTANEOUS
  Filled 2024-04-12 (×3): qty 0.07

## 2024-04-12 NOTE — ED Provider Notes (Signed)
 [facility] MEDICAL SURGICAL UNIT Provider Note   CSN: [id number] Arrival date & time: 04/11/24  1647     Patient presents with: Jaundice   Craig Andrews is a 43 y.o. male.   Patient has history of EtOH abuse.  Patient presents with jaundice and weakness.  The history is provided by the patient and medical records. No language interpreter was used.  Weakness Severity:  Moderate Onset quality:  Sudden Timing:  Constant Progression:  Worsening Chronicity:  New Context: alcohol use   Relieved by:  Nothing Worsened by:  Nothing Ineffective treatments:  None tried Associated symptoms: no abdominal pain, no chest pain, no cough, no diarrhea, no frequency, no headaches and no seizures        Prior to Admission medications   Medication Sig Start Date End Date Taking? Authorizing Provider  cetirizine (ZYRTEC) 10 MG tablet Take 10 mg by mouth at bedtime.   Yes [provider]  diphenhydrAMINE (BENADRYL) 25 MG tablet Take 25 mg by mouth every 6 (six) hours as needed for allergies or sleep.   Yes [provider]  diphenhydramine-acetaminophen  (TYLENOL  PM) 25-500 MG TABS tablet Take 2 tablets by mouth at bedtime as needed (sleep).   Yes [provider]  ibuprofen  (ADVIL ) 200 MG tablet Take 400 mg by mouth every 6 (six) hours as needed for fever.   Yes [provider]  lactulose  (CHRONULAC ) 10 GM/15ML solution TAKE 15 ML BY MOUTH  TWICE DAILY Patient taking differently: Take 15 mLs by mouth 2 (two) times daily as needed for mild constipation. 08/26/22  Yes Carlan, Chelsea L, NP  Multiple Vitamin (MULTIVITAMIN WITH MINERALS) TABS tablet Take 1 tablet by mouth daily.   Yes [provider]  omeprazole  (PRILOSEC  OTC) 20 MG tablet Take 1 tablet (20 mg total) by mouth 2 (two) times daily. Patient taking differently: Take 20 mg by mouth daily. 08/06/21 04/11/24 Yes Carlan, Chelsea L, NP  Blood Glucose Monitoring Suppl DEVI 1 each by Does not apply route  in the morning, at noon, and at bedtime. May substitute to any manufacturer covered by patient's insurance. 01/08/23   Suellen Cantor A, PA-C  folic acid  (FOLVITE ) 1 MG tablet Take 1 tablet by mouth once daily Patient not taking: Reported on 04/11/2024 10/06/22   Carlan, Chelsea L, NP  furosemide  (LASIX ) 20 MG tablet TAKE 1 TABLET BY MOUTH ONCE DAILY IN THE MORNING Patient not taking: Reported on 04/11/2024 12/31/22   Carlan, Chelsea L, NP  metFORMIN  (GLUCOPHAGE ) 500 MG tablet Take 1 tablet (500 mg total) by mouth 2 (two) times daily with a meal. Patient not taking: Reported on 04/11/2024 01/08/23   Suellen Cantor A, PA-C  nadolol  (CORGARD ) 40 MG tablet Take 1 tablet (40 mg total) by mouth 2 (two) times daily. Patient not taking: Reported on 04/11/2024 12/31/22   Carlan, Chelsea L, NP  spironolactone  (ALDACTONE ) 50 MG tablet Take 1 tablet by mouth once daily Patient not taking: Reported on 04/11/2024 08/26/22   Carlan, Chelsea L, NP    Allergies: Patient has no known allergies.    Review of Systems  Constitutional:  Negative for appetite change and fatigue.  HENT:  Negative for congestion, ear discharge and sinus pressure.   Eyes:  Negative for discharge.  Respiratory:  Negative for cough.   Cardiovascular:  Negative for chest pain.  Gastrointestinal:  Negative for abdominal pain and diarrhea.  Genitourinary:  Negative for frequency and hematuria.  Musculoskeletal:  Negative for back pain.  Skin:  Negative  for rash.  Neurological:  Positive for weakness. Negative for seizures and headaches.  Psychiatric/Behavioral:  Negative for hallucinations.     Updated Vital Signs BP 139/81 (BP Location: Right Arm)   Pulse 84   Temp 99 F (37.2 C) (Oral)   Resp (!) 22   Ht 5' 11 (1.803 m)   Wt 89.8 kg   SpO2 97%   BMI 27.61 kg/m   Physical Exam Vitals and nursing note reviewed.  Constitutional:      Appearance: He is well-developed.  HENT:     Head: Normocephalic.     Nose: Nose normal.   Eyes:     General: No scleral icterus.    Conjunctiva/sclera: Conjunctivae normal.  Neck:     Thyroid: No thyromegaly.  Cardiovascular:     Rate and Rhythm: Normal rate and regular rhythm.     Heart sounds: No murmur heard.    No friction rub. No gallop.  Pulmonary:     Breath sounds: No stridor. No wheezing or rales.  Chest:     Chest wall: No tenderness.  Abdominal:     General: There is no distension.     Tenderness: There is no abdominal tenderness. There is no rebound.  Musculoskeletal:        General: Normal range of motion.     Cervical back: Neck supple.  Lymphadenopathy:     Cervical: No cervical adenopathy.  Skin:    Findings: No erythema or rash.  Neurological:     Mental Status: He is alert and oriented to person, place, and time.     Motor: No abnormal muscle tone.     Coordination: Coordination normal.  Psychiatric:        Behavior: Behavior normal.     (all labs ordered are listed, but only abnormal results are displayed) Labs Reviewed  CBC WITH DIFFERENTIAL/PLATELET - Abnormal; Notable for the following components:      Result Value   RBC 4.14 (*)    MCH 35.5 (*)    Platelets 104 (*)    Monocytes Absolute 1.2 (*)    All other components within normal limits  COMPREHENSIVE METABOLIC PANEL WITH GFR - Abnormal; Notable for the following components:   Sodium 132 (*)    Potassium 2.7 (*)    Chloride 94 (*)    CO2 20 (*)    Glucose, Bld 407 (*)    BUN <5 (*)    Creatinine, Ser 0.55 (*)    Calcium 8.0 (*)    Albumin  3.2 (*)    AST 307 (*)    ALT 108 (*)    Alkaline Phosphatase 249 (*)    Total Bilirubin 7.5 (*)    Anion gap 17 (*)    All other components within normal limits  LIPASE, BLOOD - Abnormal; Notable for the following components:   Lipase 84 (*)    All other components within normal limits  ETHANOL - Abnormal; Notable for the following components:   Alcohol, Ethyl (B) 207 (*)    All other components within normal limits  HEMOGLOBIN  A1C - Abnormal; Notable for the following components:   Hgb A1c MFr Bld 7.3 (*)    All other components within normal limits  BLOOD GAS, VENOUS - Abnormal; Notable for the following components:   pH, Ven 7.54 (*)    pCO2, Ven 37 (*)    pO2, Ven 64 (*)    Bicarbonate 31.6 (*)    Acid-Base Excess 8.6 (*)  All other components within normal limits  HEPATIC FUNCTION PANEL - Abnormal; Notable for the following components:   Albumin  2.9 (*)    AST 307 (*)    ALT 99 (*)    Alkaline Phosphatase 225 (*)    Total Bilirubin 7.6 (*)    Bilirubin, Direct 6.1 (*)    Indirect Bilirubin 1.5 (*)    All other components within normal limits  GLUCOSE, CAPILLARY - Abnormal; Notable for the following components:   Glucose-Capillary 206 (*)    All other components within normal limits  BASIC METABOLIC PANEL WITH GFR - Abnormal; Notable for the following components:   Potassium 2.9 (*)    Glucose, Bld 141 (*)    BUN <5 (*)    Creatinine, Ser 0.53 (*)    Calcium 7.7 (*)    All other components within normal limits  GLUCOSE, CAPILLARY - Abnormal; Notable for the following components:   Glucose-Capillary 168 (*)    All other components within normal limits  CBC - Abnormal; Notable for the following components:   RBC 3.91 (*)    HCT 38.9 (*)    MCH 35.3 (*)    Platelets 93 (*)    All other components within normal limits  VITAMIN B12 - Abnormal; Notable for the following components:   Vitamin B-12 2,939 (*)    All other components within normal limits  GLUCOSE, CAPILLARY - Abnormal; Notable for the following components:   Glucose-Capillary 179 (*)    All other components within normal limits  PROTIME-INR - Abnormal; Notable for the following components:   Prothrombin Time 16.6 (*)    INR 1.3 (*)    All other components within normal limits  GLUCOSE, CAPILLARY - Abnormal; Notable for the following components:   Glucose-Capillary 182 (*)    All other components within normal limits  MRSA NEXT  GEN BY PCR, NASAL  BETA-HYDROXYBUTYRIC ACID  MAGNESIUM   FOLATE  MAGNESIUM   AFP TUMOR MARKER  HIV ANTIBODY (ROUTINE TESTING W REFLEX)  AFP TUMOR MARKER    EKG: None  Radiology: CT ABDOMEN PELVIS W CONTRAST Result Date: 04/11/2024 CLINICAL DATA:  Cirrhosis, jaundice, abdominal pain EXAM: CT ABDOMEN AND PELVIS WITH CONTRAST TECHNIQUE: Multidetector CT imaging of the abdomen and pelvis was performed using the standard protocol following bolus administration of intravenous contrast. RADIATION DOSE REDUCTION: This exam was performed according to the departmental dose-optimization program which includes automated exposure control, adjustment of the mA and/or kV according to patient size and/or use of iterative reconstruction technique. CONTRAST:  OMNIPAQUE  IOHEXOL  300 MG/ML  SOLN COMPARISON:  04/16/2021 FINDINGS: Lower chest: No acute pleural or parenchymal lung disease. Hepatobiliary: There is marked heterogeneity of the liver parenchyma, compatible with cirrhosis. Numerous hypodense nodules are seen throughout the liver, indeterminate on this single phase exam. Correlation with nonemergent outpatient dedicated liver MRI with without contrast is recommended for further characterization and to assess for underlying malignancy. No biliary duct dilation. Marked distension of the gallbladder without evidence of cholelithiasis or cholecystitis. Pancreas: Unremarkable. No pancreatic ductal dilatation or surrounding inflammatory changes. Spleen: Normal in size without focal abnormality. Adrenals/Urinary Tract: Adrenal glands are unremarkable. Kidneys are normal, without renal calculi, focal lesion, or hydronephrosis. Bladder is unremarkable. Stomach/Bowel: No bowel obstruction or ileus. There is nonspecific diffuse colonic wall thickening, most pronounced within the cecum and ascending colon. Findings could reflect inflammatory or infectious colitis, though portal colopathy could give a similar appearance  given the presence of cirrhosis. Normal appendix right lower quadrant. Vascular/Lymphatic:  Atherosclerosis of the abdominal aorta. There is evidence of portal venous hypertension, with enlarged main portal vein, recanalization of the umbilical vein, as well as numerous upper abdominal varices primarily within the stomach and distal esophagus. No pathologic adenopathy. Reproductive: Prostate is unremarkable. Other: Trace ascites. No free intraperitoneal gas. No abdominal wall hernia. Musculoskeletal: No acute or destructive bony abnormalities. Reconstructed images demonstrate no additional findings. IMPRESSION: 1. Cirrhotic morphology of the liver, with numerous hypodense nodules throughout the liver parenchyma. When the patient is clinically stable and able to follow directions and hold their breath (preferably as an outpatient) further evaluation with dedicated abdominal MRI should be considered for further characterization and to exclude underlying hepatocellular carcinoma. 2. Portal venous hypertension, manifested by trace ascites, recanalization of the umbilical vein, and extensive upper abdominal varices. 3. Diffuse colonic wall thickening, nonspecific. Differential diagnosis would include inflammatory/infectious colitis versus portal colopathy. 4. Distended gallbladder, with no evidence of cholelithiasis or cholecystitis. 5.  Aortic Atherosclerosis (ICD10-I70.0). Electronically Signed   By: Ozell Daring M.D.   On: 04/11/2024 19:18     Procedures   Medications Ordered in the ED  Chlorhexidine  Gluconate Cloth 2 % PADS 6 each (6 each Topical Given 04/11/24 2132)  lactated ringers  infusion ( Intravenous Other (enter comment in med admin window) 04/12/24 1142)  dextrose  50 % solution 0-50 mL (has no administration in time range)  LORazepam  (ATIVAN ) tablet 1-4 mg (has no administration in time range)    Or  LORazepam  (ATIVAN ) injection 1-4 mg (has no administration in time range)  thiamine  (VITAMIN B1)  tablet 100 mg (100 mg Oral Given 04/12/24 0840)    Or  thiamine  (VITAMIN B1) injection 100 mg ( Intravenous See Alternative 04/12/24 0840)  folic acid  (FOLVITE ) tablet 1 mg (1 mg Oral Given 04/12/24 0839)  multivitamin with minerals tablet 1 tablet (1 tablet Oral Given 04/12/24 0839)  pantoprazole  (PROTONIX ) injection 40 mg (40 mg Intravenous Given 04/11/24 2221)  metroNIDAZOLE  (FLAGYL ) IVPB 500 mg (500 mg Intravenous New Bag/Given 04/12/24 1040)  insulin  aspart (novoLOG ) injection 0-15 Units (3 Units Subcutaneous Given 04/12/24 0839)  insulin  aspart (novoLOG ) injection 0-5 Units ( Subcutaneous Not Given 04/12/24 0041)  prochlorperazine (COMPAZINE) injection 5 mg (has no administration in time range)  polyethylene glycol (MIRALAX / GLYCOLAX) packet 17 g (has no administration in time range)  cefTRIAXone  (ROCEPHIN ) 2 g in sodium chloride  0.9 % 100 mL IVPB (has no administration in time range)  insulin  glargine (LANTUS ) injection 10 Units (has no administration in time range)  potassium chloride  (KLOR-CON ) packet 40 mEq (has no administration in time range)  potassium chloride  SA (KLOR-CON  M) CR tablet 40 mEq (40 mEq Oral Given 04/11/24 1853)  iohexol  (OMNIPAQUE ) 300 MG/ML solution 100 mL (100 mLs Intravenous Contrast Given 04/11/24 1859)  potassium chloride  10 mEq in 100 mL IVPB (0 mEq Intravenous Stopped 04/11/24 2058)  potassium chloride  10 mEq in 100 mL IVPB (0 mEq Intravenous Stopped 04/12/24 1143)  lactated ringers  bolus 1,724 mL (0 mLs Intravenous Stopped 04/11/24 2315)  insulin  starter kit- pen needles (English) 1 kit (1 kit Other Given 04/12/24 1040)                                    Medical Decision Making Amount and/or Complexity of Data Reviewed Labs: ordered. Radiology: ordered.  Risk Prescription drug management. Decision regarding hospitalization.   Patient with cirrhosis.  I spoke to GI and they  will consult tomorrow they wanted a alpha-fetoprotein ordered.  Patient will  be admitted to medicine     Final diagnoses:  Alcoholic cirrhosis, unspecified whether ascites present [facility])    ED Discharge Orders     None          Suzette Pac, MD 04/12/24 1156

## 2024-04-12 NOTE — Inpatient Diabetes Management (Signed)
 Inpatient Diabetes Program Recommendations  AACE/ADA: New Consensus Statement on Inpatient Glycemic Control (2015)  Target Ranges:  Prepandial:   less than 140 mg/dL      Peak postprandial:   less than 180 mg/dL (1-2 hours)      Critically ill patients:  140 - 180 mg/dL   Lab Results  Component Value Date   GLUCAP 179 (H) 04/12/2024   HGBA1C 13.4 (H) 01/08/2023    Latest Reference Range & Units 04/11/24 23:31 04/12/24 00:41 04/12/24 08:15  Glucose-Capillary 70 - 99 mg/dL 793 (H) 831 (H) 820 (H)  (H): Data is abnormally high  Latest Reference Range & Units 04/12/24 07:31  Sodium 135 - 145 mmol/L 135  Potassium 3.5 - 5.1 mmol/L 2.9 (L)  Chloride 98 - 111 mmol/L 99  CO2 22 - 32 mmol/L 24  Glucose 70 - 99 mg/dL 858 (H)  BUN 6 - 20 mg/dL <5 (L)  Creatinine 9.38 - 1.24 mg/dL 9.46 (L)  Calcium 8.9 - 10.3 mg/dL 7.7 (L)  Anion gap 5 - 15  13  (L): Data is abnormally low (H): Data is abnormally high  Diabetes history: DM2 Outpatient Diabetes medications:  Metformin  500 mg bid (not taking) Current orders for Inpatient glycemic control: Lantus  7 units bid Novolog  0-15 units tid, 0-5 units hs  Inpatient Diabetes Program Recommendations:   Ordered insulin  pen starter kit and transition of care consult regarding no insurance listed. Patient may also be able to start on Novolin 70/30 insulin  bid from [facility] which is approximately $43 per box of 5 pens (total 1500 units). Nurses, please start allowing patient to administer his own insulin  and teach insulin  pen. Will follow while hospitalized.  Thank you, Shearon Clonch E. Shaima Sardinas, RN, MSN, CNS, CDCES  Diabetes Coordinator Inpatient Glycemic Control Team Team Pager [phone] (8am-5pm) 04/12/2024 9:29 AM

## 2024-04-12 NOTE — Plan of Care (Signed)

## 2024-04-12 NOTE — Consult Note (Signed)
 Gastroenterology Consult   Referring Provider: No ref. provider found Primary Care Physician:  Harl Jayson CROME, MD (Inactive) Primary Gastroenterologist:  Dr. Eartha   Patient ID: Craig Andrews; [id number]; [birth year]   Admit date: 04/11/2024  LOS: 1 day   Date of Consultation: 04/12/2024  Reason for Consultation:  alcoholic cirrhosis, hyperbilirubinemia, elevated LFTs   History of Present Illness   Craig Andrews is a 43 y.o. year old male with history of alcoholic liver cirrhosis, alcoholic hepatitis, EVs, SBP, DM who presented to the ED yesterday with lower abdominal pain and yellowing of his skin. 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 GI consulted for further evaluation  ED Course: -Temperature 98.2.  Heart rate 97-104.  Respirate rate 19-20.  Blood pressure systolic 120-137.  O2 sats 91 to 96% on room air. -Sodium 132, potassium 2.7.  Blood glucose 407. -AST 307, ALT 108, ALP 249, T. bili of 7.5. -Ethanol 207. -folate 10.8  -AFP tumor marker pending -CT abdomen pelvis with contrast -cirrhotic liver, numerous hypodense liver nodules MRI recommended to exclude underlying hepatocellular carcinoma.  Extensive upper abdominal varices.  Diffuse colonic wall thickening nonspecific-infectious/inflammatory colitis versus portal colopathy.  Distended gallbladder-no evidence of cholelithiasis or cholecystitis.  Consult: Patient well known to me, last seen in clinic in later 2024, lost to follow up thereafter.   States he lost his job and insurance, off of all medications for about 6 months. Has been drinking 3-4 four locos daily x6-8 months. Denies any use of liquor. He reports looking in the mirror a few days ago and realizing his skin appeared more yellow. He denies any swelling to his abdomen or extremities, no episodes of confusion. Some lower abdominal pain over the past couple of days. No constipation or diarrhea.  Denies BRBPR. Endorses a few episodes of black stools, thinks last was about 3 days ago and had an episode of coffee ground emesis about a week ago. States he plans to quit drinking after discussion with his SO but fell back into old habits hanging out with people that drink etoh regularly.    Last EGD:10/2021 - Grade I and grade II esophageal varices.                           - Portal hypertensive gastropathy.                           - Normal examined duodenum.                           - No specimens collected. Last Colonoscopy: never     Past Medical History:  Diagnosis Date   Allergy    Cirrhosis (HCC)    Hypertension     Past Surgical History:  Procedure Laterality Date   ESOPHAGEAL BANDING  04/01/2021   Procedure: ESOPHAGEAL BANDING;  Surgeon: Eartha Flavors, Toribio, MD;  Location: [facility];  Service: Gastroenterology;;   ESOPHAGOGASTRODUODENOSCOPY (EGD) WITH PROPOFOL  N/A 04/01/2021   Procedure: ESOPHAGOGASTRODUODENOSCOPY (EGD) WITH PROPOFOL ;  Surgeon: Eartha Flavors Toribio, MD;  Location: [facility];  Service: Gastroenterology;  Laterality: N/A;   ESOPHAGOGASTRODUODENOSCOPY (EGD) WITH PROPOFOL  N/A 04/29/2021   Procedure: ESOPHAGOGASTRODUODENOSCOPY (EGD) WITH PROPOFOL ;  Surgeon: Eartha Flavors Toribio, MD;  Location: [facility];  Service: Gastroenterology;  Laterality: N/A;  8:05   ESOPHAGOGASTRODUODENOSCOPY (EGD) WITH  PROPOFOL  N/A 10/24/2021   Procedure: ESOPHAGOGASTRODUODENOSCOPY (EGD) WITH PROPOFOL ;  Surgeon: Eartha Angelia Sieving, MD;  Location: [facility];  Service: Gastroenterology;  Laterality: N/A;  1035   FRACTURE SURGERY     rod inside leg      Prior to Admission medications   Medication Sig Start Date End Date Taking? Authorizing Provider  cetirizine (ZYRTEC) 10 MG tablet Take 10 mg by mouth at bedtime.   Yes [provider]  diphenhydrAMINE (BENADRYL) 25 MG tablet Take 25 mg by mouth every 6 (six) hours as needed for allergies  or sleep.   Yes [provider]  diphenhydramine-acetaminophen  (TYLENOL  PM) 25-500 MG TABS tablet Take 2 tablets by mouth at bedtime as needed (sleep).   Yes [provider]  ibuprofen  (ADVIL ) 200 MG tablet Take 400 mg by mouth every 6 (six) hours as needed for fever.   Yes [provider]  lactulose  (CHRONULAC ) 10 GM/15ML solution TAKE 15 ML BY MOUTH  TWICE DAILY Patient taking differently: Take 15 mLs by mouth 2 (two) times daily as needed for mild constipation. 08/26/22  Yes Ocean Kearley L, NP  Multiple Vitamin (MULTIVITAMIN WITH MINERALS) TABS tablet Take 1 tablet by mouth daily.   Yes [provider]  omeprazole  (PRILOSEC  OTC) 20 MG tablet Take 1 tablet (20 mg total) by mouth 2 (two) times daily. Patient taking differently: Take 20 mg by mouth daily. 08/06/21 04/11/24 Yes Dylen Mcelhannon L, NP  Blood Glucose Monitoring Suppl DEVI 1 each by Does not apply route in the morning, at noon, and at bedtime. May substitute to any manufacturer covered by patient's insurance. 01/08/23   Suellen Cantor A, PA-C  folic acid  (FOLVITE ) 1 MG tablet Take 1 tablet by mouth once daily Patient not taking: Reported on 04/11/2024 10/06/22   Margie Urbanowicz L, NP  furosemide  (LASIX ) 20 MG tablet TAKE 1 TABLET BY MOUTH ONCE DAILY IN THE MORNING Patient not taking: Reported on 04/11/2024 12/31/22   Beni Turrell L, NP  metFORMIN  (GLUCOPHAGE ) 500 MG tablet Take 1 tablet (500 mg total) by mouth 2 (two) times daily with a meal. Patient not taking: Reported on 04/11/2024 01/08/23   Suellen Cantor A, PA-C  nadolol  (CORGARD ) 40 MG tablet Take 1 tablet (40 mg total) by mouth 2 (two) times daily. Patient not taking: Reported on 04/11/2024 12/31/22   Mariette Mitzie CROME, NP  spironolactone  (ALDACTONE ) 50 MG tablet Take 1 tablet by mouth once daily Patient not taking: Reported on 04/11/2024 08/26/22   Ludivina Guymon L, NP    Current Facility-Administered Medications  Medication Dose Route Frequency  Provider Last Rate Last Admin   acetaminophen  (TYLENOL ) tablet 1,000 mg  1,000 mg Oral Q8H Tat, Alm, MD   1,000 mg at 04/12/24 9160   cefTRIAXone  (ROCEPHIN ) 2 g in sodium chloride  0.9 % 100 mL IVPB  2 g Intravenous Q24H Emokpae, Ejiroghene E, MD 200 mL/hr at 04/11/24 2325 2 g at 04/11/24 2325   Chlorhexidine  Gluconate Cloth 2 % PADS 6 each  6 each Topical Q0600 Emokpae, Ejiroghene E, MD   6 each at 04/11/24 2132   dextrose  50 % solution 0-50 mL  0-50 mL Intravenous PRN Emokpae, Ejiroghene E, MD       folic acid  (FOLVITE ) tablet 1 mg  1 mg Oral Daily Emokpae, Ejiroghene E, MD   1 mg at 04/12/24 0839   insulin  aspart (novoLOG ) injection 0-15 Units  0-15 Units Subcutaneous TID WC Shona Laurence N, DO   3 Units at 04/12/24  9160   insulin  aspart (novoLOG ) injection 0-5 Units  0-5 Units Subcutaneous QHS Shona Laurence N, DO       insulin  glargine (LANTUS ) injection 7 Units  7 Units Subcutaneous BID Tat, Alm, MD       lactated ringers  infusion   Intravenous Continuous Shona Laurence SAILOR, DO 50 mL/hr at 04/12/24 0023 Rate Change at 04/12/24 0023   LORazepam  (ATIVAN ) tablet 1-4 mg  1-4 mg Oral Q1H PRN Emokpae, Ejiroghene E, MD       Or   LORazepam  (ATIVAN ) injection 1-4 mg  1-4 mg Intravenous Q1H PRN Emokpae, Ejiroghene E, MD       metroNIDAZOLE  (FLAGYL ) IVPB 500 mg  500 mg Intravenous Q12H Emokpae, Ejiroghene E, MD 100 mL/hr at 04/11/24 2325 500 mg at 04/11/24 2325   multivitamin with minerals tablet 1 tablet  1 tablet Oral Daily Emokpae, Ejiroghene E, MD   1 tablet at 04/12/24 0839   pantoprazole  (PROTONIX ) injection 40 mg  40 mg Intravenous Q24H Emokpae, Ejiroghene E, MD   40 mg at 04/11/24 2221   polyethylene glycol (MIRALAX / GLYCOLAX) packet 17 g  17 g Oral Daily PRN Shona Laurence N, DO       prochlorperazine (COMPAZINE) injection 5 mg  5 mg Intravenous Q6H PRN Hall, Carole N, DO       thiamine  (VITAMIN B1) tablet 100 mg  100 mg Oral Daily Emokpae, Ejiroghene E, MD   100 mg at 04/12/24 0840   Or    thiamine  (VITAMIN B1) injection 100 mg  100 mg Intravenous Daily Emokpae, Ejiroghene E, MD        Allergies as of 04/11/2024   (No Known Allergies)    No family history on file.  Social History   Socioeconomic History   Marital status: Single    Spouse name: Not on file   Number of children: Not on file   Years of education: Not on file   Highest education level: Not on file  Occupational History   Not on file  Tobacco Use   Smoking status: Every Day    Current packs/day: 0.50    Types: Cigarettes, Cigars    Passive exposure: Current   Smokeless tobacco: Never  Vaping Use   Vaping status: Never Used  Substance and Sexual Activity   Alcohol use: Yes    Comment: today   Drug use: Yes    Frequency: 2.0 times per week    Types: Marijuana   Sexual activity: Not on file  Other Topics Concern   Not on file  Social History Narrative   Not on file   Social Drivers of Health   Financial Resource Strain: Not on file  Food Insecurity: No Food Insecurity (04/11/2024)   Hunger Vital Sign    Worried About Running Out of Food in the Last Year: Never true    Ran Out of Food in the Last Year: Never true  Transportation Needs: No Transportation Needs (04/11/2024)   PRAPARE - Administrator, Civil Service (Medical): No    Lack of Transportation (Non-Medical): No  Physical Activity: Not on file  Stress: Not on file  Social Connections: Unknown (09/16/2021)   Received from [facility]   Social Network    Social Network: Not on file  Intimate Partner Violence: Not At Risk (04/11/2024)   Humiliation, Afraid, Rape, and Kick questionnaire    Fear of Current or Ex-Partner: No    Emotionally Abused: No    Physically Abused:  No    Sexually Abused: No     Review of Systems   Gen: Denies any fever, chills, loss of appetite, change in weight or weight loss CV: Denies chest pain, heart palpitations, syncope, edema  Resp: Denies shortness of breath with rest, cough,  wheezing, coughing up blood, and pleurisy. HP:izwpzd hematochezia, nausea, diarrhea, constipation, dysphagia, odyonophagia, early satiety or weight loss. +black stools +lower abdominal pain +hematemesis GU : Denies urinary burning, blood in urine, urinary frequency, and urinary incontinence. MS: Denies joint pain, limitation of movement, swelling, cramps, and atrophy.  Derm: Denies rash, itching, dry skin, hives. Psych: Denies depression, anxiety, memory loss, hallucinations, and confusion. Heme: Denies bruising or bleeding Neuro:  Denies any headaches, dizziness, paresthesias, shaking  Physical Exam   Vital Signs in last 24 hours: Temp:  [98.2 F (36.8 C)-100.6 F (38.1 C)] 99.9 F (37.7 C) (12/10 0831) Pulse Rate:  [95-115] 101 (12/10 0600) Resp:  [15-37] 26 (12/10 0600) BP: (120-142)/(76-102) 142/80 (12/10 0600) SpO2:  [91 %-96 %] 92 % (12/10 0600) Weight:  [86.2 kg-88 kg] 88 kg (12/10 0553) Last BM Date : 04/10/24  General:   Alert,  Well-developed, well-nourished, pleasant and cooperative in NAD Head:  Normocephalic and atraumatic. Eyes:  sclera mildly icteric  Ears:  Normal auditory acuity. Mouth:  No deformity or lesions, dentition normal. Neck:  Supple; no masses Lungs:  Clear throughout to auscultation.   No wheezes, crackles, or rhonchi. No acute distress. Heart:  Regular rate and rhythm; no murmurs, clicks, rubs,  or gallops. Abdomen:  Soft, nontender and nondistended. No masses, hepatosplenomegaly or hernias noted. Normal bowel sounds, without guarding, and without rebound.     Msk:  Symmetrical without gross deformities. Normal posture. Extremities:  Without clubbing or edema. Neurologic:  Alert and  oriented x4. No asterixis  Skin: jaundiced Psych:  Alert and cooperative. Normal mood and affect.   Labs/Studies   Recent Labs Recent Labs    04/11/24 1723 04/12/24 0731  WBC 6.9 7.0  HGB 14.7 13.8  HCT 41.3 38.9*  PLT 104* 93*   BMET Recent Labs     04/11/24 1723 04/12/24 0731  NA 132* 135  K 2.7* 2.9*  CL 94* 99  CO2 20* 24  GLUCOSE 407* 141*  BUN <5* <5*  CREATININE 0.55* 0.53*  CALCIUM 8.0* 7.7*   LFT Recent Labs    04/11/24 1723 04/12/24 0731  PROT 8.1 7.5  ALBUMIN  3.2* 2.9*  AST 307* 307*  ALT 108* 99*  ALKPHOS 249* 225*  BILITOT 7.5* 7.6*  BILIDIR  --  6.1*  IBILI  --  1.5*    Radiology/Studies CT ABDOMEN PELVIS W CONTRAST Result Date: 04/11/2024 CLINICAL DATA:  Cirrhosis, jaundice, abdominal pain EXAM: CT ABDOMEN AND PELVIS WITH CONTRAST TECHNIQUE: Multidetector CT imaging of the abdomen and pelvis was performed using the standard protocol following bolus administration of intravenous contrast. RADIATION DOSE REDUCTION: This exam was performed according to the departmental dose-optimization program which includes automated exposure control, adjustment of the mA and/or kV according to patient size and/or use of iterative reconstruction technique. CONTRAST:  OMNIPAQUE  IOHEXOL  300 MG/ML  SOLN COMPARISON:  04/16/2021 FINDINGS: Lower chest: No acute pleural or parenchymal lung disease. Hepatobiliary: There is marked heterogeneity of the liver parenchyma, compatible with cirrhosis. Numerous hypodense nodules are seen throughout the liver, indeterminate on this single phase exam. Correlation with nonemergent outpatient dedicated liver MRI with without contrast is recommended for further characterization and to assess for underlying malignancy. No biliary  duct dilation. Marked distension of the gallbladder without evidence of cholelithiasis or cholecystitis. Pancreas: Unremarkable. No pancreatic ductal dilatation or surrounding inflammatory changes. Spleen: Normal in size without focal abnormality. Adrenals/Urinary Tract: Adrenal glands are unremarkable. Kidneys are normal, without renal calculi, focal lesion, or hydronephrosis. Bladder is unremarkable. Stomach/Bowel: No bowel obstruction or ileus. There is nonspecific diffuse  colonic wall thickening, most pronounced within the cecum and ascending colon. Findings could reflect inflammatory or infectious colitis, though portal colopathy could give a similar appearance given the presence of cirrhosis. Normal appendix right lower quadrant. Vascular/Lymphatic: Atherosclerosis of the abdominal aorta. There is evidence of portal venous hypertension, with enlarged main portal vein, recanalization of the umbilical vein, as well as numerous upper abdominal varices primarily within the stomach and distal esophagus. No pathologic adenopathy. Reproductive: Prostate is unremarkable. Other: Trace ascites. No free intraperitoneal gas. No abdominal wall hernia. Musculoskeletal: No acute or destructive bony abnormalities. Reconstructed images demonstrate no additional findings. IMPRESSION: 1. Cirrhotic morphology of the liver, with numerous hypodense nodules throughout the liver parenchyma. When the patient is clinically stable and able to follow directions and hold their breath (preferably as an outpatient) further evaluation with dedicated abdominal MRI should be considered for further characterization and to exclude underlying hepatocellular carcinoma. 2. Portal venous hypertension, manifested by trace ascites, recanalization of the umbilical vein, and extensive upper abdominal varices. 3. Diffuse colonic wall thickening, nonspecific. Differential diagnosis would include inflammatory/infectious colitis versus portal colopathy. 4. Distended gallbladder, with no evidence of cholelithiasis or cholecystitis. 5.  Aortic Atherosclerosis (ICD10-I70.0). Electronically Signed   By: Craig Daring M.D.   On: 04/11/2024 19:18     Assessment   Daxton Nydam is a 43 y.o. year old male with history of alcholic liver cirrhosis, alcoholic hepatitis, EVs, SBP, DM who presented to the ED yesterfday with lower abdominal pain and yellowing of his skin. Reported ongoing ETOh abuse, last drink around 1430, endorsed  black stools x2 about 2 weeks ago. GI consulted for further evaluation  Elevated LFTs/ETOH abuse/ alcoholic cirrhosis: -bilirubin 6.1 (7.5), AST 307 (307, ALT 108 (99), Alk phos 225 (249) albumin  2.9 (3.2) -AFP pending, CT concerning for possible HCC-numerous hypodense liver lesions in setting of cirrhosis -MELD 3.0 19  -no episodes of confusion, ascites or LE edema. -reported black stools, last maybe about 3 days ago, episode of coffee ground emesis about 1 week ago, no hematemesis or BRBPR.  -endorses drinking 3-4 four locos per day x6-8 months, no liquor  At this time, suspect we are dealing with alcoholic hepatitis in setting of heavy ETOH abuse. He has no signs of HE, no ascites or LE edema on exam. DF is 22.7, no indication for steroids at this time. LFTs trending down reassuringly.   He has history of EVs, not on NSBB for atleast the past 6 months, endorses some episodes of melena and coffee ground emesis within the past week but none currently. Would be important to evaluate further via EGD during admission. Indications, risks and benefits of procedure discussed in detail with patient. Patient verbalized understanding and is in agreement to proceed with EGD  CT with numerous hypodense lesions on liver, concerning for possible HCC in setting of cirrhosis. AFP is pending, would be important to further characterize these lesions via MRI while hospitalized to determine if they correspond to [facility].   Discussed need for complete ETOH cessation with the patient who acknowledges he knows he needs to stop drinking, has stopped on his own in the past and plans  to have complete cessation moving forward. Could consider referral for evaluation of liver transplant in the future pending complete and ongoing alcohol cessation.   Plan / Recommendations   - Daily MELD labs  - complete Alcohol cessation -consider referral for transplant but will need complete and continued ETOH cessation, can be discussed  as outpatient - Abdominal MRI with and without IV contrast  - Follow up on AFP result -plan for EGD tomorrow -NPO midnight  -Pantoprazole  40 mg BID  - Reduce salt intake to <2 g per day - heart healthy diet - Can take Tylenol  max of 2 g per day (650 mg q8h) for pain - Avoid NSAIDs for pain - Ensure/protein shake every night before going to sleep -folate, thiamine  supplementation per hospitalist -CIWA protocol  -Rocephin  1g q24h -outpatient cirrhosis care    04/12/2024, 8:52 AM  Arcadio Cope L. Pallavi Clifton, MSN, APRN, AGNP-C Adult-Gerontology Nurse Practitioner [facility] at [facility]

## 2024-04-12 NOTE — Progress Notes (Signed)
 Nurse at bedside as well as significant other. No questions or concerns were presented to myself at this time.

## 2024-04-12 NOTE — Progress Notes (Signed)
 PROGRESS NOTE  Craig Andrews FMW:[id number] DOB: [birth year] DOA: 04/11/2024 PCP: Harl Jayson CROME, MD (Inactive)  Brief History:  History per Dr. Dicie Carwin:   43 y.o. male with medical history significant for alcoholic liver cirrhosis, esophageal varices, spontaneous bacterial peritonitis, diabetes mellitus. Patient presented to the ED with complaints of lower abdominal pain, yellowing of his skin.  No nausea no vomiting, no diarrhea.  Ongoing alcohol abuse, last drink was earlier today at about 2:30 PM.  He drinks vodka, reports he is trying to cut down.  He reports 2 episodes of black stools about a week ago.  Reports he lost his insurance, so he has not been able to get or take his medications which include nadolol , metformin , spironolactone . Patient states that he has never taken insulin .  He has previously metformin .  He continues to drink on a daily basis.  He drinks more liquid on a daily basis.  He smokes 1/2 pack/day for the last 25 years.   ED Course: Temperature 98.2.  Heart rate 97-104.  Respirate rate 19-20.  Blood pressure systolic 120-137.  O2 sats 91 to 96% on room air. Sodium 132, potassium 2.7.  Anion gap of 17, serum bicarb of 20.  Blood glucose 407. AST 307, ALT 108, ALP 249, T. bili of 7.5. Blood alcohol level 207. CT abdomen pelvis with contrast -cirrhotic liver, numerous hypodense liver nodules MRI recommended to exclude underlying hepatocellular carcinoma.  Extensive upper abdominal varices.  Diffuse colonic wall thickening nonspecific-infectious/inflammatory colitis versus portal colopathy.  Distended gallbladder-no evidence of cholelithiasis or cholecystitis. GI consulted, recommended checking AFP, will send consult in a.m.   Assessment/Plan: DKA type 2 -patient started on IV insulin  with q 1 hour CBG check and q 4 hour BMPs -pt started on aggressive fluid resuscitation -Electrolytes were monitored and repleted -transitioned to [place] insulin  once anion  gap closed -diet was advanced once anion gap closed -HbA1C--pending  Alcohol abuse/intoxication - Alcohol level 207 at the time of admission - Continue CIWA protocol  Transaminasemia -04/11/2024 CT AP--cirrhotic liver with hypodense nodules, trace ascites, upper abdominal varices, diffuse colonic wall thickening - Viral hepatitis panel -no RUQ pain -tolerating diet  Liver cirrhosis with portal hypertension - Continue IV pantoprazole  - Incidental hypodense nodules noted on the liver on CT - GI consulted - Follow-up alpha-fetoprotein  Hypokalemia - Repeating - Check magnesium   Colonic wall thickening - GI consult - 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  He denies any fevers, chills, chest pain, shortness breath, nausea, vomiting, diarrhea.  There is no dysuria or hematuria.  He has had intermittent melena.  There is no hematochezia.  Objective: Vitals:   04/12/24 0500 04/12/24 0553 04/12/24 0600 04/12/24 0831  BP: 136/76  (!) 142/80   Pulse: (!) 104  (!) 101   Resp: (!) 28  (!) 26   Temp:  (!) 100.6 F (38.1 C)  99.9 F (37.7 C)  TempSrc:  Oral  Oral  SpO2: 91%  92%   Weight:  88 kg    Height:        Intake/Output Summary (Last 24 hours) at 04/12/2024 1019 Last data filed at 04/12/2024 0600 Gross per 24 hour  Intake 1762.93 ml  Output --  Net 1762.93 ml   Weight change:  Exam:  General:  Pt is alert, follows commands appropriately, not in acute distress HEENT: No icterus, No thrush, No  neck mass, [place]/AT Cardiovascular: RRR, S1/S2, no rubs, no gallops Respiratory: Bibasilar crackles.  No wheezing. Abdomen: Soft/+BS, LLQ tender, non distended, no guarding Extremities: No edema, No lymphangitis, No petechiae, No rashes, no synovitis   Data Reviewed: I have personally reviewed following labs and imaging studies Basic Metabolic Panel: Recent Labs  Lab 04/11/24 1723 04/11/24 2102 04/12/24 0731  NA 132*  --  135  K 2.7*  --  2.9*  CL 94*  --  99  CO2 20*  --  24  GLUCOSE 407*  --  141*  BUN <5*  --  <5*  CREATININE 0.55*  --  0.53*  CALCIUM 8.0*  --  7.7*  MG  --  2.0 1.8   Liver Function Tests: Recent Labs  Lab 04/11/24 1723 04/12/24 0731  AST 307* 307*  ALT 108* 99*  ALKPHOS 249* 225*  BILITOT 7.5* 7.6*  PROT 8.1 7.5  ALBUMIN  3.2* 2.9*   Recent Labs  Lab 04/11/24 1723  LIPASE 84*   No results for input(s): AMMONIA in the last 168 hours. Coagulation Profile: Recent Labs  Lab 04/12/24 0928  INR 1.3*   CBC: Recent Labs  Lab 04/11/24 1723 04/12/24 0731  WBC 6.9 7.0  NEUTROABS 4.8  --   HGB 14.7 13.8  HCT 41.3 38.9*  MCV 99.8 99.5  PLT 104* 93*   Cardiac Enzymes: No results for input(s): CKTOTAL, CKMB, CKMBINDEX, TROPONINI in the last 168 hours. BNP: Invalid input(s): POCBNP CBG: Recent Labs  Lab 04/11/24 2331 04/12/24 0041 04/12/24 0815  GLUCAP 206* 168* 179*   HbA1C: No results for input(s): HGBA1C in the last 72 hours. Urine analysis:    Component Value Date/Time   COLORURINE YELLOW 01/08/2023 0838   APPEARANCEUR CLEAR 01/08/2023 0838   LABSPEC 1.028 01/08/2023 0838   PHURINE 6.0 01/08/2023 0838   GLUCOSEU >=500 (A) 01/08/2023 0838   HGBUR NEGATIVE 01/08/2023 0838   BILIRUBINUR NEGATIVE 01/08/2023 0838   KETONESUR 5 (A) 01/08/2023 0838   PROTEINUR NEGATIVE 01/08/2023 0838   NITRITE NEGATIVE 01/08/2023 0838   LEUKOCYTESUR NEGATIVE 01/08/2023 0838   Sepsis  Labs: @LABRCNTIP (procalcitonin:4,lacticidven:4) )No results found for this or any previous visit (from the past 240 hours).   Scheduled Meds:  Chlorhexidine  Gluconate Cloth  6 each Topical Q0600   folic acid   1 mg Oral Daily   insulin  aspart  0-15 Units Subcutaneous TID WC   insulin  aspart  0-5 Units Subcutaneous QHS   insulin  glargine  7 Units Subcutaneous BID   insulin  starter kit- pen needles  1 kit Other Once   multivitamin with minerals  1 tablet Oral Daily   pantoprazole  (PROTONIX ) IV  40 mg Intravenous Q24H   thiamine   100 mg Oral Daily   Or   thiamine   100 mg Intravenous Daily   Continuous Infusions:  cefTRIAXone  (ROCEPHIN )  IV     lactated ringers  50 mL/hr at 04/12/24 0023   metronidazole  500 mg (04/11/24 2325)    Procedures/Studies: CT ABDOMEN PELVIS W CONTRAST Result Date: 04/11/2024 CLINICAL DATA:  Cirrhosis, jaundice, abdominal pain EXAM: CT  ABDOMEN AND PELVIS WITH CONTRAST TECHNIQUE: Multidetector CT imaging of the abdomen and pelvis was performed using the standard protocol following bolus administration of intravenous contrast. RADIATION DOSE REDUCTION: This exam was performed according to the departmental dose-optimization program which includes automated exposure control, adjustment of the mA and/or kV according to patient size and/or use of iterative reconstruction technique. CONTRAST:  OMNIPAQUE  IOHEXOL  300 MG/ML  SOLN COMPARISON:  04/16/2021 FINDINGS: Lower chest: No acute pleural or parenchymal lung disease. Hepatobiliary: There is marked heterogeneity of the liver parenchyma, compatible with cirrhosis. Numerous hypodense nodules are seen throughout the liver, indeterminate on this single phase exam. Correlation with nonemergent outpatient dedicated liver MRI with without contrast is recommended for further characterization and to assess for underlying malignancy. No biliary duct dilation. Marked distension of the gallbladder without evidence of cholelithiasis or  cholecystitis. Pancreas: Unremarkable. No pancreatic ductal dilatation or surrounding inflammatory changes. Spleen: Normal in size without focal abnormality. Adrenals/Urinary Tract: Adrenal glands are unremarkable. Kidneys are normal, without renal calculi, focal lesion, or hydronephrosis. Bladder is unremarkable. Stomach/Bowel: No bowel obstruction or ileus. There is nonspecific diffuse colonic wall thickening, most pronounced within the cecum and ascending colon. Findings could reflect inflammatory or infectious colitis, though portal colopathy could give a similar appearance given the presence of cirrhosis. Normal appendix right lower quadrant. Vascular/Lymphatic: Atherosclerosis of the abdominal aorta. There is evidence of portal venous hypertension, with enlarged main portal vein, recanalization of the umbilical vein, as well as numerous upper abdominal varices primarily within the stomach and distal esophagus. No pathologic adenopathy. Reproductive: Prostate is unremarkable. Other: Trace ascites. No free intraperitoneal gas. No abdominal wall hernia. Musculoskeletal: No acute or destructive bony abnormalities. Reconstructed images demonstrate no additional findings. IMPRESSION: 1. Cirrhotic morphology of the liver, with numerous hypodense nodules throughout the liver parenchyma. When the patient is clinically stable and able to follow directions and hold their breath (preferably as an outpatient) further evaluation with dedicated abdominal MRI should be considered for further characterization and to exclude underlying hepatocellular carcinoma. 2. Portal venous hypertension, manifested by trace ascites, recanalization of the umbilical vein, and extensive upper abdominal varices. 3. Diffuse colonic wall thickening, nonspecific. Differential diagnosis would include inflammatory/infectious colitis versus portal colopathy. 4. Distended gallbladder, with no evidence of cholelithiasis or cholecystitis. 5.  Aortic  Atherosclerosis (ICD10-I70.0). Electronically Signed   By: Ozell Daring M.D.   On: 04/11/2024 19:18    Alm Schneider, DO  [facility]  If 7PM-7AM, please contact night-coverage [email or web address] Password [email or web address] 04/12/2024, 10:19 AM   LOS: 1 day

## 2024-04-12 NOTE — H&P (View-Only) (Signed)
 Gastroenterology Consult   Referring Provider: No ref. provider found Primary Care Physician:  Harl Jayson CROME, MD (Inactive) Primary Gastroenterologist:  Dr. Eartha   Patient ID: Craig Andrews; [id number]; [birth year]   Admit date: 04/11/2024  LOS: 1 day   Date of Consultation: 04/12/2024  Reason for Consultation:  alcoholic cirrhosis, hyperbilirubinemia, elevated LFTs   History of Present Illness   Craig Andrews is a 43 y.o. year old male with history of alcoholic liver cirrhosis, alcoholic hepatitis, EVs, SBP, DM who presented to the ED yesterday with lower abdominal pain and yellowing of his skin. 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 GI consulted for further evaluation  ED Course: -Temperature 98.2.  Heart rate 97-104.  Respirate rate 19-20.  Blood pressure systolic 120-137.  O2 sats 91 to 96% on room air. -Sodium 132, potassium 2.7.  Blood glucose 407. -AST 307, ALT 108, ALP 249, T. bili of 7.5. -Ethanol 207. -folate 10.8  -AFP tumor marker pending -CT abdomen pelvis with contrast -cirrhotic liver, numerous hypodense liver nodules MRI recommended to exclude underlying hepatocellular carcinoma.  Extensive upper abdominal varices.  Diffuse colonic wall thickening nonspecific-infectious/inflammatory colitis versus portal colopathy.  Distended gallbladder-no evidence of cholelithiasis or cholecystitis.  Consult: Patient well known to me, last seen in clinic in later 2024, lost to follow up thereafter.   States he lost his job and insurance, off of all medications for about 6 months. Has been drinking 3-4 four locos daily x6-8 months. Denies any use of liquor. He reports looking in the mirror a few days ago and realizing his skin appeared more yellow. He denies any swelling to his abdomen or extremities, no episodes of confusion. Some lower abdominal pain over the past couple of days. No constipation or diarrhea.  Denies BRBPR. Endorses a few episodes of black stools, thinks last was about 3 days ago and had an episode of coffee ground emesis about a week ago. States he plans to quit drinking after discussion with his SO but fell back into old habits hanging out with people that drink etoh regularly.    Last EGD:10/2021 - Grade I and grade II esophageal varices.                           - Portal hypertensive gastropathy.                           - Normal examined duodenum.                           - No specimens collected. Last Colonoscopy: never     Past Medical History:  Diagnosis Date   Allergy    Cirrhosis (HCC)    Hypertension     Past Surgical History:  Procedure Laterality Date   ESOPHAGEAL BANDING  04/01/2021   Procedure: ESOPHAGEAL BANDING;  Surgeon: Eartha Flavors, Toribio, MD;  [facility]: [facility];  Service: Gastroenterology;;   ESOPHAGOGASTRODUODENOSCOPY (EGD) WITH PROPOFOL  N/A 04/01/2021   Procedure: ESOPHAGOGASTRODUODENOSCOPY (EGD) WITH PROPOFOL ;  Surgeon: Eartha Flavors Toribio, MD;  Location: [facility];  Service: Gastroenterology;  Laterality: N/A;   ESOPHAGOGASTRODUODENOSCOPY (EGD) WITH PROPOFOL  N/A 04/29/2021   Procedure: ESOPHAGOGASTRODUODENOSCOPY (EGD) WITH PROPOFOL ;  Surgeon: Eartha Flavors Toribio, MD;  Location: [facility];  Service: Gastroenterology;  Laterality: N/A;  8:05   ESOPHAGOGASTRODUODENOSCOPY (EGD) WITH  PROPOFOL  N/A 10/24/2021   Procedure: ESOPHAGOGASTRODUODENOSCOPY (EGD) WITH PROPOFOL ;  Surgeon: Eartha Angelia Sieving, MD;  Location: [facility];  Service: Gastroenterology;  Laterality: N/A;  1035   FRACTURE SURGERY     rod inside leg      Prior to Admission medications   Medication Sig Start Date End Date Taking? Authorizing Provider  cetirizine (ZYRTEC) 10 MG tablet Take 10 mg by mouth at bedtime.   Yes [provider]  diphenhydrAMINE (BENADRYL) 25 MG tablet Take 25 mg by mouth every 6 (six) hours as needed for allergies  or sleep.   Yes [provider]  diphenhydramine-acetaminophen  (TYLENOL  PM) 25-500 MG TABS tablet Take 2 tablets by mouth at bedtime as needed (sleep).   Yes [provider]  ibuprofen  (ADVIL ) 200 MG tablet Take 400 mg by mouth every 6 (six) hours as needed for fever.   Yes [provider]  lactulose  (CHRONULAC ) 10 GM/15ML solution TAKE 15 ML BY MOUTH  TWICE DAILY Patient taking differently: Take 15 mLs by mouth 2 (two) times daily as needed for mild constipation. 08/26/22  Yes Ocean Kearley L, NP  Multiple Vitamin (MULTIVITAMIN WITH MINERALS) TABS tablet Take 1 tablet by mouth daily.   Yes [provider]  omeprazole  (PRILOSEC  OTC) 20 MG tablet Take 1 tablet (20 mg total) by mouth 2 (two) times daily. Patient taking differently: Take 20 mg by mouth daily. 08/06/21 04/11/24 Yes Dylen Mcelhannon L, NP  Blood Glucose Monitoring Suppl DEVI 1 each by Does not apply route in the morning, at noon, and at bedtime. May substitute to any manufacturer covered by patient's insurance. 01/08/23   Suellen Cantor A, PA-C  folic acid  (FOLVITE ) 1 MG tablet Take 1 tablet by mouth once daily Patient not taking: Reported on 04/11/2024 10/06/22   Margie Urbanowicz L, NP  furosemide  (LASIX ) 20 MG tablet TAKE 1 TABLET BY MOUTH ONCE DAILY IN THE MORNING Patient not taking: Reported on 04/11/2024 12/31/22   Beni Turrell L, NP  metFORMIN  (GLUCOPHAGE ) 500 MG tablet Take 1 tablet (500 mg total) by mouth 2 (two) times daily with a meal. Patient not taking: Reported on 04/11/2024 01/08/23   Suellen Cantor A, PA-C  nadolol  (CORGARD ) 40 MG tablet Take 1 tablet (40 mg total) by mouth 2 (two) times daily. Patient not taking: Reported on 04/11/2024 12/31/22   Mariette Mitzie CROME, NP  spironolactone  (ALDACTONE ) 50 MG tablet Take 1 tablet by mouth once daily Patient not taking: Reported on 04/11/2024 08/26/22   Ludivina Guymon L, NP    Current Facility-Administered Medications  Medication Dose Route Frequency  Provider Last Rate Last Admin   acetaminophen  (TYLENOL ) tablet 1,000 mg  1,000 mg Oral Q8H Tat, Alm, MD   1,000 mg at 04/12/24 9160   cefTRIAXone  (ROCEPHIN ) 2 g in sodium chloride  0.9 % 100 mL IVPB  2 g Intravenous Q24H Emokpae, Ejiroghene E, MD 200 mL/hr at 04/11/24 2325 2 g at 04/11/24 2325   Chlorhexidine  Gluconate Cloth 2 % PADS 6 each  6 each Topical Q0600 Emokpae, Ejiroghene E, MD   6 each at 04/11/24 2132   dextrose  50 % solution 0-50 mL  0-50 mL Intravenous PRN Emokpae, Ejiroghene E, MD       folic acid  (FOLVITE ) tablet 1 mg  1 mg Oral Daily Emokpae, Ejiroghene E, MD   1 mg at 04/12/24 0839   insulin  aspart (novoLOG ) injection 0-15 Units  0-15 Units Subcutaneous TID WC Shona Laurence N, DO   3 Units at 04/12/24  9160   insulin  aspart (novoLOG ) injection 0-5 Units  0-5 Units Subcutaneous QHS Shona Laurence N, DO       insulin  glargine (LANTUS ) injection 7 Units  7 Units Subcutaneous BID Tat, Alm, MD       lactated ringers  infusion   Intravenous Continuous Shona Laurence SAILOR, DO 50 mL/hr at 04/12/24 0023 Rate Change at 04/12/24 0023   LORazepam  (ATIVAN ) tablet 1-4 mg  1-4 mg Oral Q1H PRN Emokpae, Ejiroghene E, MD       Or   LORazepam  (ATIVAN ) injection 1-4 mg  1-4 mg Intravenous Q1H PRN Emokpae, Ejiroghene E, MD       metroNIDAZOLE  (FLAGYL ) IVPB 500 mg  500 mg Intravenous Q12H Emokpae, Ejiroghene E, MD 100 mL/hr at 04/11/24 2325 500 mg at 04/11/24 2325   multivitamin with minerals tablet 1 tablet  1 tablet Oral Daily Emokpae, Ejiroghene E, MD   1 tablet at 04/12/24 0839   pantoprazole  (PROTONIX ) injection 40 mg  40 mg Intravenous Q24H Emokpae, Ejiroghene E, MD   40 mg at 04/11/24 2221   polyethylene glycol (MIRALAX / GLYCOLAX) packet 17 g  17 g Oral Daily PRN Shona Laurence N, DO       prochlorperazine (COMPAZINE) injection 5 mg  5 mg Intravenous Q6H PRN Hall, Carole N, DO       thiamine  (VITAMIN B1) tablet 100 mg  100 mg Oral Daily Emokpae, Ejiroghene E, MD   100 mg at 04/12/24 0840   Or    thiamine  (VITAMIN B1) injection 100 mg  100 mg Intravenous Daily Emokpae, Ejiroghene E, MD        Allergies as of 04/11/2024   (No Known Allergies)    No family history on file.  Social History   Socioeconomic History   Marital status: Single    Spouse name: Not on file   Number of children: Not on file   Years of education: Not on file   Highest education level: Not on file  Occupational History   Not on file  Tobacco Use   Smoking status: Every Day    Current packs/day: 0.50    Types: Cigarettes, Cigars    Passive exposure: Current   Smokeless tobacco: Never  Vaping Use   Vaping status: Never Used  Substance and Sexual Activity   Alcohol use: Yes    Comment: today   Drug use: Yes    Frequency: 2.0 times per week    Types: Marijuana   Sexual activity: Not on file  Other Topics Concern   Not on file  Social History Narrative   Not on file   Social Drivers of Health   Financial Resource Strain: Not on file  Food Insecurity: No Food Insecurity (04/11/2024)   Hunger Vital Sign    Worried About Running Out of Food in the Last Year: Never true    Ran Out of Food in the Last Year: Never true  Transportation Needs: No Transportation Needs (04/11/2024)   PRAPARE - Administrator, Civil Service (Medical): No    Lack of Transportation (Non-Medical): No  Physical Activity: Not on file  Stress: Not on file  Social Connections: Unknown (09/16/2021)   Received from [facility]   Social Network    Social Network: Not on file  Intimate Partner Violence: Not At Risk (04/11/2024)   Humiliation, Afraid, Rape, and Kick questionnaire    Fear of Current or Ex-Partner: No    Emotionally Abused: No    Physically Abused:  No    Sexually Abused: No     Review of Systems   Gen: Denies any fever, chills, loss of appetite, change in weight or weight loss CV: Denies chest pain, heart palpitations, syncope, edema  Resp: Denies shortness of breath with rest, cough,  wheezing, coughing up blood, and pleurisy. HP:izwpzd hematochezia, nausea, diarrhea, constipation, dysphagia, odyonophagia, early satiety or weight loss. +black stools +lower abdominal pain +hematemesis GU : Denies urinary burning, blood in urine, urinary frequency, and urinary incontinence. MS: Denies joint pain, limitation of movement, swelling, cramps, and atrophy.  Derm: Denies rash, itching, dry skin, hives. Psych: Denies depression, anxiety, memory loss, hallucinations, and confusion. Heme: Denies bruising or bleeding Neuro:  Denies any headaches, dizziness, paresthesias, shaking  Physical Exam   Vital Signs in last 24 hours: Temp:  [98.2 F (36.8 C)-100.6 F (38.1 C)] 99.9 F (37.7 C) (12/10 0831) Pulse Rate:  [95-115] 101 (12/10 0600) Resp:  [15-37] 26 (12/10 0600) BP: (120-142)/(76-102) 142/80 (12/10 0600) SpO2:  [91 %-96 %] 92 % (12/10 0600) Weight:  [86.2 kg-88 kg] 88 kg (12/10 0553) Last BM Date : 04/10/24  General:   Alert,  Well-developed, well-nourished, pleasant and cooperative in NAD Head:  Normocephalic and atraumatic. Eyes:  sclera mildly icteric  Ears:  Normal auditory acuity. Mouth:  No deformity or lesions, dentition normal. Neck:  Supple; no masses Lungs:  Clear throughout to auscultation.   No wheezes, crackles, or rhonchi. No acute distress. Heart:  Regular rate and rhythm; no murmurs, clicks, rubs,  or gallops. Abdomen:  Soft, nontender and nondistended. No masses, hepatosplenomegaly or hernias noted. Normal bowel sounds, without guarding, and without rebound.     Msk:  Symmetrical without gross deformities. Normal posture. Extremities:  Without clubbing or edema. Neurologic:  Alert and  oriented x4. No asterixis  Skin: jaundiced Psych:  Alert and cooperative. Normal mood and affect.   Labs/Studies   Recent Labs Recent Labs    04/11/24 1723 04/12/24 0731  WBC 6.9 7.0  HGB 14.7 13.8  HCT 41.3 38.9*  PLT 104* 93*   BMET Recent Labs     04/11/24 1723 04/12/24 0731  NA 132* 135  K 2.7* 2.9*  CL 94* 99  CO2 20* 24  GLUCOSE 407* 141*  BUN <5* <5*  CREATININE 0.55* 0.53*  CALCIUM 8.0* 7.7*   LFT Recent Labs    04/11/24 1723 04/12/24 0731  PROT 8.1 7.5  ALBUMIN  3.2* 2.9*  AST 307* 307*  ALT 108* 99*  ALKPHOS 249* 225*  BILITOT 7.5* 7.6*  BILIDIR  --  6.1*  IBILI  --  1.5*    Radiology/Studies CT ABDOMEN PELVIS W CONTRAST Result Date: 04/11/2024 CLINICAL DATA:  Cirrhosis, jaundice, abdominal pain EXAM: CT ABDOMEN AND PELVIS WITH CONTRAST TECHNIQUE: Multidetector CT imaging of the abdomen and pelvis was performed using the standard protocol following bolus administration of intravenous contrast. RADIATION DOSE REDUCTION: This exam was performed according to the departmental dose-optimization program which includes automated exposure control, adjustment of the mA and/or kV according to patient size and/or use of iterative reconstruction technique. CONTRAST:  OMNIPAQUE  IOHEXOL  300 MG/ML  SOLN COMPARISON:  04/16/2021 FINDINGS: Lower chest: No acute pleural or parenchymal lung disease. Hepatobiliary: There is marked heterogeneity of the liver parenchyma, compatible with cirrhosis. Numerous hypodense nodules are seen throughout the liver, indeterminate on this single phase exam. Correlation with nonemergent outpatient dedicated liver MRI with without contrast is recommended for further characterization and to assess for underlying malignancy. No biliary  duct dilation. Marked distension of the gallbladder without evidence of cholelithiasis or cholecystitis. Pancreas: Unremarkable. No pancreatic ductal dilatation or surrounding inflammatory changes. Spleen: Normal in size without focal abnormality. Adrenals/Urinary Tract: Adrenal glands are unremarkable. Kidneys are normal, without renal calculi, focal lesion, or hydronephrosis. Bladder is unremarkable. Stomach/Bowel: No bowel obstruction or ileus. There is nonspecific diffuse  colonic wall thickening, most pronounced within the cecum and ascending colon. Findings could reflect inflammatory or infectious colitis, though portal colopathy could give a similar appearance given the presence of cirrhosis. Normal appendix right lower quadrant. Vascular/Lymphatic: Atherosclerosis of the abdominal aorta. There is evidence of portal venous hypertension, with enlarged main portal vein, recanalization of the umbilical vein, as well as numerous upper abdominal varices primarily within the stomach and distal esophagus. No pathologic adenopathy. Reproductive: Prostate is unremarkable. Other: Trace ascites. No free intraperitoneal gas. No abdominal wall hernia. Musculoskeletal: No acute or destructive bony abnormalities. Reconstructed images demonstrate no additional findings. IMPRESSION: 1. Cirrhotic morphology of the liver, with numerous hypodense nodules throughout the liver parenchyma. When the patient is clinically stable and able to follow directions and hold their breath (preferably as an outpatient) further evaluation with dedicated abdominal MRI should be considered for further characterization and to exclude underlying hepatocellular carcinoma. 2. Portal venous hypertension, manifested by trace ascites, recanalization of the umbilical vein, and extensive upper abdominal varices. 3. Diffuse colonic wall thickening, nonspecific. Differential diagnosis would include inflammatory/infectious colitis versus portal colopathy. 4. Distended gallbladder, with no evidence of cholelithiasis or cholecystitis. 5.  Aortic Atherosclerosis (ICD10-I70.0). Electronically Signed   By: Craig Daring M.D.   On: 04/11/2024 19:18     Assessment   Daxton Nydam is a 43 y.o. year old male with history of alcholic liver cirrhosis, alcoholic hepatitis, EVs, SBP, DM who presented to the ED yesterfday with lower abdominal pain and yellowing of his skin. Reported ongoing ETOh abuse, last drink around 1430, endorsed  black stools x2 about 2 weeks ago. GI consulted for further evaluation  Elevated LFTs/ETOH abuse/ alcoholic cirrhosis: -bilirubin 6.1 (7.5), AST 307 (307, ALT 108 (99), Alk phos 225 (249) albumin  2.9 (3.2) -AFP pending, CT concerning for possible HCC-numerous hypodense liver lesions in setting of cirrhosis -MELD 3.0 19  -no episodes of confusion, ascites or LE edema. -reported black stools, last maybe about 3 days ago, episode of coffee ground emesis about 1 week ago, no hematemesis or BRBPR.  -endorses drinking 3-4 four locos per day x6-8 months, no liquor  At this time, suspect we are dealing with alcoholic hepatitis in setting of heavy ETOH abuse. He has no signs of HE, no ascites or LE edema on exam. DF is 22.7, no indication for steroids at this time. LFTs trending down reassuringly.   He has history of EVs, not on NSBB for atleast the past 6 months, endorses some episodes of melena and coffee ground emesis within the past week but none currently. Would be important to evaluate further via EGD during admission. Indications, risks and benefits of procedure discussed in detail with patient. Patient verbalized understanding and is in agreement to proceed with EGD  CT with numerous hypodense lesions on liver, concerning for possible HCC in setting of cirrhosis. AFP is pending, would be important to further characterize these lesions via MRI while hospitalized to determine if they correspond to [facility].   Discussed need for complete ETOH cessation with the patient who acknowledges he knows he needs to stop drinking, has stopped on his own in the past and plans  to have complete cessation moving forward. Could consider referral for evaluation of liver transplant in the future pending complete and ongoing alcohol cessation.   Plan / Recommendations   - Daily MELD labs  - complete Alcohol cessation -consider referral for transplant but will need complete and continued ETOH cessation, can be discussed  as outpatient - Abdominal MRI with and without IV contrast  - Follow up on AFP result -plan for EGD tomorrow -NPO midnight  -Pantoprazole  40 mg BID  - Reduce salt intake to <2 g per day - heart healthy diet - Can take Tylenol  max of 2 g per day (650 mg q8h) for pain - Avoid NSAIDs for pain - Ensure/protein shake every night before going to sleep -folate, thiamine  supplementation per hospitalist -CIWA protocol  -Rocephin  1g q24h -outpatient cirrhosis care    04/12/2024, 8:52 AM  Arcadio Cope L. Pallavi Clifton, MSN, APRN, AGNP-C Adult-Gerontology Nurse Practitioner [facility] Gastroenterology at [facility]

## 2024-04-12 NOTE — TOC Progression Note (Addendum)
°>  Transition of Care [facility]) - Inpatient Brief Assessment   Patient Details  Name: Craig Andrews MRN: [id number] Date of Birth: 09/04/1980  Transition of Care [facility]) CM/SW Contact:    Sharlyne Stabs, RN Phone Number: 04/12/2024, 9:10 AM   Clinical Narrative:  Patient admitted with hypokalemia. Lives at home with patients. Patient has a PCP, but no insurance. Substance abuse resources added to AVS. IPCM following for possible medication assistance.   Transition of Care Asessment: Insurance and Status: Selfpay Patient has primary care physician: Yes Home environment has been reviewed: Home with parents Prior level of function:: Independent Prior/Current Home Services: No current home services Social Drivers of Health Review: SDOH reviewed no interventions necessary Readmission risk has been reviewed: Yes Transition of care needs: no transition of care needs at this time       Expected Discharge Plan and Services    Home with Substance abuse resources     Social Drivers of Health (SDOH) Interventions SDOH Screenings   Food Insecurity: No Food Insecurity (04/11/2024)  Housing: Low Risk  (04/11/2024)  Transportation Needs: No Transportation Needs (04/11/2024)  Utilities: Not At Risk (04/11/2024)  Social Connections: Unknown (09/16/2021)   Received from [facility]  Tobacco Use: High Risk (04/11/2024)

## 2024-04-12 NOTE — Hospital Course (Addendum)
 History per Dr. Dicie Carwin:   43 y.o. male with medical history significant for alcoholic liver cirrhosis, esophageal varices, spontaneous bacterial peritonitis, diabetes mellitus. Patient presented to the ED with complaints of lower abdominal pain, yellowing of his skin.  No nausea no vomiting, no diarrhea.  Ongoing alcohol abuse, last drink was earlier today at about 2:30 PM.  He drinks vodka, reports he is trying to cut down.  He reports 2 episodes of black stools about a week ago.  Reports he lost his insurance, so he has not been able to get or take his medications which include nadolol , metformin , spironolactone . Patient states that he has never taken insulin .  He has previously metformin .  He continues to drink on a daily basis.  He drinks more liquid on a daily basis.  He smokes 1/2 pack/day for the last 25 years.   ED Course: Temperature 98.2.  Heart rate 97-104.  Respirate rate 19-20.  Blood pressure systolic 120-137.  O2 sats 91 to 96% on room air. Sodium 132, potassium 2.7.  Anion gap of 17, serum bicarb of 20.  Blood glucose 407. AST 307, ALT 108, ALP 249, T. bili of 7.5. Blood alcohol level 207. CT abdomen pelvis with contrast -cirrhotic liver, numerous hypodense liver nodules MRI recommended to exclude underlying hepatocellular carcinoma.  Extensive upper abdominal varices.  Diffuse colonic wall thickening nonspecific-infectious/inflammatory colitis versus portal colopathy.  Distended gallbladder-no evidence of cholelithiasis or cholecystitis. GI consulted, recommended checking AFP, will send consult in a.m.

## 2024-04-13 ENCOUNTER — Telehealth: Payer: Self-pay | Admitting: Gastroenterology

## 2024-04-13 ENCOUNTER — Encounter (HOSPITAL_COMMUNITY)
Admission: EM | Disposition: A | Discharge status: Home / Self Care | Payer: Self-pay | Source: Home / Self Care | Attending: Internal Medicine

## 2024-04-13 ENCOUNTER — Inpatient Hospital Stay (HOSPITAL_COMMUNITY): Payer: MEDICAID | Admitting: Certified Registered"

## 2024-04-13 DIAGNOSIS — I8500 Esophageal varices without bleeding: Secondary | ICD-10-CM

## 2024-04-13 DIAGNOSIS — I10 Essential (primary) hypertension: Secondary | ICD-10-CM

## 2024-04-13 DIAGNOSIS — K3189 Other diseases of stomach and duodenum: Secondary | ICD-10-CM

## 2024-04-13 DIAGNOSIS — F17210 Nicotine dependence, cigarettes, uncomplicated: Secondary | ICD-10-CM

## 2024-04-13 DIAGNOSIS — I8511 Secondary esophageal varices with bleeding: Secondary | ICD-10-CM

## 2024-04-13 HISTORY — PX: ESOPHAGOGASTRODUODENOSCOPY: SHX5428

## 2024-04-13 HISTORY — PX: ESOPHAGEAL BANDING: SHX5518

## 2024-04-13 LAB — CBC
HCT: 39.1 % (ref 39.0–52.0)
Hemoglobin: 13.9 g/dL (ref 13.0–17.0)
MCH: 35.7 pg — ABNORMAL HIGH (ref 26.0–34.0)
MCHC: 35.5 g/dL (ref 30.0–36.0)
MCV: 100.5 fL — ABNORMAL HIGH (ref 80.0–100.0)
Platelets: 92 K/uL — ABNORMAL LOW (ref 150–400)
RBC: 3.89 MIL/uL — ABNORMAL LOW (ref 4.22–5.81)
RDW: 15.9 % — ABNORMAL HIGH (ref 11.5–15.5)
WBC: 5.4 K/uL (ref 4.0–10.5)
nRBC: 0 % (ref 0.0–0.2)

## 2024-04-13 LAB — COMPREHENSIVE METABOLIC PANEL WITH GFR
ALT: 93 U/L — ABNORMAL HIGH (ref 0–44)
AST: 276 U/L — ABNORMAL HIGH (ref 15–41)
Albumin: 2.9 g/dL — ABNORMAL LOW (ref 3.5–5.0)
Alkaline Phosphatase: 189 U/L — ABNORMAL HIGH (ref 38–126)
Anion gap: 8 (ref 5–15)
BUN: <5 mg/dL — ABNORMAL LOW (ref 6–20)
CO2: 29 mmol/L (ref 22–32)
Calcium: 7.9 mg/dL — ABNORMAL LOW (ref 8.9–10.3)
Chloride: 101 mmol/L (ref 98–111)
Creatinine, Ser: 0.62 mg/dL (ref 0.61–1.24)
GFR, Estimated: >60 mL/min (ref >60)
Glucose, Bld: 96 mg/dL (ref 70–99)
Potassium: 3 mmol/L — ABNORMAL LOW (ref 3.5–5.1)
Sodium: 138 mmol/L (ref 135–145)
Total Bilirubin: 9.4 mg/dL — ABNORMAL HIGH (ref 0.0–1.2)
Total Protein: 7.3 g/dL (ref 6.5–8.1)

## 2024-04-13 LAB — GLUCOSE, CAPILLARY
Glucose-Capillary: 113 mg/dL — ABNORMAL HIGH (ref 70–99)
Glucose-Capillary: 108 mg/dL — ABNORMAL HIGH (ref 70–99)
Glucose-Capillary: 146 mg/dL — ABNORMAL HIGH (ref 70–99)
Glucose-Capillary: 111 mg/dL — ABNORMAL HIGH (ref 70–99)
Glucose-Capillary: 214 mg/dL — ABNORMAL HIGH (ref 70–99)

## 2024-04-13 LAB — MAGNESIUM: Magnesium: 1.7 mg/dL (ref 1.7–2.4)

## 2024-04-13 LAB — AFP TUMOR MARKER
AFP, Serum, Tumor Marker: 2.7 ng/mL (ref 0.0–6.9)
AFP, Serum, Tumor Marker: 2.8 ng/mL (ref 0.0–6.9)

## 2024-04-13 LAB — PROTIME-INR
INR: 1.2 (ref 0.8–1.2)
Prothrombin Time: 16.3 s — ABNORMAL HIGH (ref 11.4–15.2)

## 2024-04-13 SURGERY — EGD (ESOPHAGOGASTRODUODENOSCOPY)
Anesthesia: Monitor Anesthesia Care

## 2024-04-13 MED ORDER — INSULIN GLARGINE 100 UNIT/ML ~~LOC~~ SOLN
10.0000 [IU] | Freq: Every day | SUBCUTANEOUS | Status: DC
  Administered 2024-04-13: 10 [IU] via SUBCUTANEOUS
  Filled 2024-04-13 (×2): qty 0.1

## 2024-04-13 MED ORDER — PANTOPRAZOLE SODIUM 40 MG IV SOLR
40.0000 mg | Freq: Two times a day (BID) | INTRAVENOUS | Status: DC
  Administered 2024-04-13 – 2024-04-14 (×2): 40 mg via INTRAVENOUS
  Filled 2024-04-13 (×2): qty 10

## 2024-04-13 MED ORDER — LACTATED RINGERS IV SOLN: INTRAVENOUS | Status: DC

## 2024-04-13 MED ORDER — KETAMINE HCL 10 MG/ML IJ SOLN
INTRAMUSCULAR | Status: DC | PRN
  Administered 2024-04-13: 50 mg via INTRAVENOUS

## 2024-04-13 MED ORDER — KETAMINE HCL 50 MG/5ML IJ SOSY
PREFILLED_SYRINGE | INTRAMUSCULAR | Status: AC
  Filled 2024-04-13: qty 5

## 2024-04-13 MED ORDER — MAGNESIUM SULFATE 2 GM/50ML IV SOLN
2.0000 g | Freq: Once | INTRAVENOUS | Status: AC
  Administered 2024-04-13: 2 g via INTRAVENOUS
  Filled 2024-04-13: qty 50

## 2024-04-13 MED ORDER — LIVING WELL WITH DIABETES BOOK: Freq: Once | Status: AC

## 2024-04-13 MED ORDER — POTASSIUM CHLORIDE 10 MEQ/100ML IV SOLN
10.0000 meq | INTRAVENOUS | Status: AC
  Administered 2024-04-13 (×2): 10 meq via INTRAVENOUS
  Filled 2024-04-13 (×2): qty 100

## 2024-04-13 MED ORDER — PROPOFOL 10 MG/ML IV BOLUS
INTRAVENOUS | Status: DC | PRN
  Administered 2024-04-13 (×7): 100 mg via INTRAVENOUS

## 2024-04-13 MED ORDER — LIDOCAINE HCL (CARDIAC) PF 100 MG/5ML IV SOSY
PREFILLED_SYRINGE | INTRAVENOUS | Status: DC | PRN
  Administered 2024-04-13: 100 mg via INTRAVENOUS

## 2024-04-13 MED ADMIN — Carvedilol Tab 3.125 MG: 3.125 mg | ORAL | NDC 68382009201

## 2024-04-13 MED FILL — Carvedilol Tab 3.125 MG: 3.1250 mg | ORAL | Qty: 1 | Status: AC

## 2024-04-13 NOTE — Progress Notes (Signed)
 PROGRESS NOTE  Craig Andrews FMW:[id number] DOB: [birth year] DOA: 04/11/2024 PCP: Harl Jayson CROME, MD (Inactive)  Brief History:  History per Dr. Dicie Carwin:   43 y.o. male with medical history significant for alcoholic liver cirrhosis, esophageal varices, spontaneous bacterial peritonitis, diabetes mellitus. Patient presented to the ED with complaints of lower abdominal pain, yellowing of his skin.  No nausea no vomiting, no diarrhea.  Ongoing alcohol abuse, last drink was earlier today at about 2:30 PM.  He drinks vodka, reports he is trying to cut down.  He reports 2 episodes of black stools about a week ago.  Reports he lost his insurance, so he has not been able to get or take his medications which include nadolol , metformin , spironolactone . Patient states that he has never taken insulin .  He has previously metformin .  He continues to drink on a daily basis.  He drinks more liquid on a daily basis.  He smokes 1/2 pack/day for the last 25 years.   ED Course: Temperature 98.2.  Heart rate 97-104.  Respirate rate 19-20.  Blood pressure systolic 120-137.  O2 sats 91 to 96% on room air. Sodium 132, potassium 2.7.  Anion gap of 17, serum bicarb of 20.  Blood glucose 407. AST 307, ALT 108, ALP 249, T. bili of 7.5. Blood alcohol level 207. CT abdomen pelvis with contrast -cirrhotic liver, numerous hypodense liver nodules MRI recommended to exclude underlying hepatocellular carcinoma.  Extensive upper abdominal varices.  Diffuse colonic wall thickening nonspecific-infectious/inflammatory colitis versus portal colopathy.  Distended gallbladder-no evidence of cholelithiasis or cholecystitis. GI consulted, recommended checking AFP, will send consult in a.m.   Assessment/Plan: DKA type 2 -patient started on IV insulin  with q 1 hour CBG check and q 4 hour BMPs -pt started on aggressive fluid resuscitation -Electrolytes were monitored and repleted -transitioned to [place] insulin  once anion  gap closed -diet was advanced once anion gap closed -12/9 HbA1C--7.3 -change lantus  to 10 units at hs  Melena 12/11 EGD--- Grade III and large ( > 5 mm) esophageal varices. Incompletely eradicated. Banded. Portal hypertensive gastropathy -continue PPI bid -clear liquids -start coreg   Alcohol abuse/intoxication - Alcohol level 207 at the time of admission - Continue CIWA protocol   Transaminasemia -04/11/2024 CT AP--cirrhotic liver with hypodense nodules, trace ascites, upper abdominal varices, diffuse colonic wall thickening - Viral hepatitis panel -no RUQ pain -tolerating diet   Liver cirrhosis with portal hypertension - Continue IV pantoprazole  - Incidental hypodense nodules noted on the liver on CT - GI consulted - Follow-up alpha-fetoprotein--2.8   Hypokalemia - Repeating - Check magnesium --1.7   Colonic wall thickening - GI consult - Suspect portal hypertensive colopathy   Thrombocytopenia - Secondary to alcoholic liver cirrhosis - Serum B12 2939 - Folic acid  10.8   Tobacco abuse - Tobacco cessation discussed                 Family Communication:   Significant other at bedside 12/10   Consultants:  GI   Code Status:  FULL    DVT Prophylaxis:  [place]     Procedures: As Listed in Progress Note Above   Antibiotics: Ceftriaxone  12/9>> Metronidazole  12/9>>      Subjective: Patient denies fevers, chills, headache, chest pain, dyspnea, nausea, vomiting, diarrhea, abdominal pain, dysuria, hematuria, hematochezia, and melena.   Objective: Vitals:   04/13/24 1113 04/13/24 1115 04/13/24 1135 04/13/24 1519  BP: (!) 139/95 (!) 139/95 128/77 127/87  Pulse: 96 94 (!)  102 82  Resp: 15 (!) 24 20   Temp: 98.8 F (37.1 C)  98.3 F (36.8 C) 98.6 F (37 C)  TempSrc:   Oral Axillary  SpO2: 99% 98% 99% 97%  Weight:      Height:        Intake/Output Summary (Last 24 hours) at 04/13/2024 1806 Last data filed at 04/13/2024 1615 Gross per 24 hour   Intake 302.01 ml  Output --  Net 302.01 ml   Weight change: 3.617 kg Exam:  General:  Pt is alert, follows commands appropriately, not in acute distress HEENT: No icterus, No thrush, No neck mass, [place]/AT Cardiovascular: RRR, S1/S2, no rubs, no gallops Respiratory: CTA bilaterally, no wheezing, no crackles, no rhonchi Abdomen: Soft/+BS, non tender, non distended, no guarding Extremities: No edema, No lymphangitis, No petechiae, No rashes, no synovitis   Data Reviewed: I have personally reviewed following labs and imaging studies Basic Metabolic Panel: Recent Labs  Lab 04/11/24 1723 04/11/24 2102 04/12/24 0731 04/13/24 0519  NA 132*  --  135 138  K 2.7*  --  2.9* 3.0*  CL 94*  --  99 101  CO2 20*  --  24 29  GLUCOSE 407*  --  141* 96  BUN <5*  --  <5* <5*  CREATININE 0.55*  --  0.53* 0.62  CALCIUM 8.0*  --  7.7* 7.9*  MG  --  2.0 1.8 1.7   Liver Function Tests: Recent Labs  Lab 04/11/24 1723 04/12/24 0731 04/13/24 0519  AST 307* 307* 276*  ALT 108* 99* 93*  ALKPHOS 249* 225* 189*  BILITOT 7.5* 7.6* 9.4*  PROT 8.1 7.5 7.3  ALBUMIN  3.2* 2.9* 2.9*   Recent Labs  Lab 04/11/24 1723  LIPASE 84*   No results for input(s): AMMONIA in the last 168 hours. Coagulation Profile: Recent Labs  Lab 04/12/24 0928 04/13/24 0519  INR 1.3* 1.2   CBC: Recent Labs  Lab 04/11/24 1723 04/12/24 0731 04/13/24 0519  WBC 6.9 7.0 5.4  NEUTROABS 4.8  --   --   HGB 14.7 13.8 13.9  HCT 41.3 38.9* 39.1  MCV 99.8 99.5 100.5*  PLT 104* 93* 92*   Cardiac Enzymes: No results for input(s): CKTOTAL, CKMB, CKMBINDEX, TROPONINI in the last 168 hours. BNP: Invalid input(s): POCBNP CBG: Recent Labs  Lab 04/12/24 2103 04/13/24 0738 04/13/24 1013 04/13/24 1230 04/13/24 1602  GLUCAP 185* 113* 108* 146* 111*   HbA1C: Recent Labs    04/11/24 1723  HGBA1C 7.3*   Urine analysis:    Component Value Date/Time   COLORURINE YELLOW 01/08/2023 0838   APPEARANCEUR  CLEAR 01/08/2023 0838   LABSPEC 1.028 01/08/2023 0838   PHURINE 6.0 01/08/2023 0838   GLUCOSEU >=500 (A) 01/08/2023 0838   HGBUR NEGATIVE 01/08/2023 0838   BILIRUBINUR NEGATIVE 01/08/2023 0838   KETONESUR 5 (A) 01/08/2023 0838   PROTEINUR NEGATIVE 01/08/2023 0838   NITRITE NEGATIVE 01/08/2023 0838   LEUKOCYTESUR NEGATIVE 01/08/2023 0838   Sepsis Labs: @LABRCNTIP (procalcitonin:4,lacticidven:4) ) Recent Results (from the past 240 hours)  MRSA Next Gen by PCR, Nasal     Status: None   Collection Time: 04/11/24  9:11 PM   Specimen: Nasal Mucosa; Nasal Swab  Result Value Ref Range Status   MRSA by PCR Next Gen NOT DETECTED NOT DETECTED Final    Comment: (NOTE) The GeneXpert MRSA Assay (FDA approved for NASAL specimens only), is one component of a comprehensive MRSA colonization surveillance program. It is not intended to diagnose MRSA  infection nor to guide or monitor treatment for MRSA infections. Test performance is not FDA approved in patients less than 57 years old. Performed at [facility], [address], [place], [place] [address]      Scheduled Meds:  Chlorhexidine  Gluconate Cloth  6 each Topical Q0600   folic acid   1 mg Oral Daily   insulin  aspart  0-15 Units Subcutaneous TID WC   insulin  aspart  0-5 Units Subcutaneous QHS   insulin  glargine  10 Units Subcutaneous QHS   multivitamin with minerals  1 tablet Oral Daily   pantoprazole  (PROTONIX ) IV  40 mg Intravenous Q24H   thiamine   100 mg Oral Daily   Or   thiamine   100 mg Intravenous Daily   Continuous Infusions:  cefTRIAXone  (ROCEPHIN )  IV Stopped (04/12/24 2228)   metronidazole  Stopped (04/13/24 1330)    Procedures/Studies: MR LIVER W WO CONTRAST Result Date: 04/13/2024 CLINICAL DATA:  Liver lesions on CT. EXAM: MRI ABDOMEN WITHOUT AND WITH CONTRAST TECHNIQUE: Multiplanar multisequence MR imaging of the abdomen was performed both before and after the administration of intravenous contrast. CONTRAST:  9mL  GADAVIST  GADOBUTROL  1 MMOL/ML IV SOLN COMPARISON:  CT scan 1 day prior. FINDINGS: Lower chest: 17 mm subpleural nodule identified posterior right lung on coronal image 27 of series 3 and axial image 5 of series 12. Hepatobiliary: Liver measures 23 cm craniocaudal length with diffuse nodular signal intensity. Nodularity of the contour associated. Features compatible cirrhosis. No focal restricted diffusion within the liver parenchyma. No focal arterial phase hyperenhancement. Areas of subcapsular heterogeneous enhancement are identified in the lateral segment left liver and inferior right liver as well as along the gallbladder fossa. 2.9 x 2.0 cm subcapsular observation in the medial segment left liver along the gallbladder fossa (image 81/14) shows some subtle posterior irregular arterial phase hyperenhancement although this is only minimal. Lesion hypo enhances relative to background liver parenchyma and the posterior hyperenhancement does not appear to washout. A second similar observation in the medial segment left liver along the gallbladder fossa measures 2.9 x 2.1 cm on image 81/14. No definite non peripheral arterial phase hyperenhancement although the lesion does hypo enhance relative to background liver parenchyma. Gallbladder is distended with gallbladder wall irregularity and gallbladder wall edema/pericholecystic fluid. No definite gallstones. No intrahepatic or extrahepatic biliary dilation. Pancreas: No focal mass lesion. No dilatation of the main duct. No intraparenchymal cyst. No peripancreatic edema. Spleen:  No splenomegaly. No suspicious focal mass lesion. Adrenals/Urinary Tract: No adrenal nodule or mass. Kidneys unremarkable. Stomach/Bowel: Stomach is unremarkable. No gastric wall thickening. No evidence of outlet obstruction. Duodenum is normally positioned as is the ligament of Treitz. No small bowel or colonic dilatation within the visualized abdomen. Vascular/Lymphatic: No abdominal aortic  aneurysm. Portal vein and superior mesenteric vein are patent. Splenic vein is patent. No lymphadenopathy in the abdomen. Other: Diffuse mesenteric edema is associated with small volume ascites. Musculoskeletal: No focal suspicious marrow enhancement within the visualized bony anatomy. IMPRESSION: 1. Markedly heterogeneous and nodular liver compatible cirrhosis. Observations in the medial segment left liver compatible with LI-RADS category 3, intermediate probability of malignancy. Follow-up MRI in 3 months recommended. 2. Cirrhosis with small volume ascites. 3. Distended gallbladder with gallbladder wall irregularity and gallbladder wall edema/pericholecystic fluid. No definite gallstones. Appearance is nonspecific in the setting of liver insufficiency. 4. 17 mm subpleural nodule posterior right lung. CT chest without contrast recommended to further evaluate. Electronically Signed   By: Camellia Candle M.D.   On: 04/13/2024 08:17  CT ABDOMEN PELVIS W CONTRAST Result Date: 04/11/2024 CLINICAL DATA:  Cirrhosis, jaundice, abdominal pain EXAM: CT ABDOMEN AND PELVIS WITH CONTRAST TECHNIQUE: Multidetector CT imaging of the abdomen and pelvis was performed using the standard protocol following bolus administration of intravenous contrast. RADIATION DOSE REDUCTION: This exam was performed according to the departmental dose-optimization program which includes automated exposure control, adjustment of the mA and/or kV according to patient size and/or use of iterative reconstruction technique. CONTRAST:  OMNIPAQUE  IOHEXOL  300 MG/ML  SOLN COMPARISON:  04/16/2021 FINDINGS: Lower chest: No acute pleural or parenchymal lung disease. Hepatobiliary: There is marked heterogeneity of the liver parenchyma, compatible with cirrhosis. Numerous hypodense nodules are seen throughout the liver, indeterminate on this single phase exam. Correlation with nonemergent outpatient dedicated liver MRI with without contrast is recommended  for further characterization and to assess for underlying malignancy. No biliary duct dilation. Marked distension of the gallbladder without evidence of cholelithiasis or cholecystitis. Pancreas: Unremarkable. No pancreatic ductal dilatation or surrounding inflammatory changes. Spleen: Normal in size without focal abnormality. Adrenals/Urinary Tract: Adrenal glands are unremarkable. Kidneys are normal, without renal calculi, focal lesion, or hydronephrosis. Bladder is unremarkable. Stomach/Bowel: No bowel obstruction or ileus. There is nonspecific diffuse colonic wall thickening, most pronounced within the cecum and ascending colon. Findings could reflect inflammatory or infectious colitis, though portal colopathy could give a similar appearance given the presence of cirrhosis. Normal appendix right lower quadrant. Vascular/Lymphatic: Atherosclerosis of the abdominal aorta. There is evidence of portal venous hypertension, with enlarged main portal vein, recanalization of the umbilical vein, as well as numerous upper abdominal varices primarily within the stomach and distal esophagus. No pathologic adenopathy. Reproductive: Prostate is unremarkable. Other: Trace ascites. No free intraperitoneal gas. No abdominal wall hernia. Musculoskeletal: No acute or destructive bony abnormalities. Reconstructed images demonstrate no additional findings. IMPRESSION: 1. Cirrhotic morphology of the liver, with numerous hypodense nodules throughout the liver parenchyma. When the patient is clinically stable and able to follow directions and hold their breath (preferably as an outpatient) further evaluation with dedicated abdominal MRI should be considered for further characterization and to exclude underlying hepatocellular carcinoma. 2. Portal venous hypertension, manifested by trace ascites, recanalization of the umbilical vein, and extensive upper abdominal varices. 3. Diffuse colonic wall thickening, nonspecific. Differential  diagnosis would include inflammatory/infectious colitis versus portal colopathy. 4. Distended gallbladder, with no evidence of cholelithiasis or cholecystitis. 5.  Aortic Atherosclerosis (ICD10-I70.0). Electronically Signed   By: Ozell Daring M.D.   On: 04/11/2024 19:18    Alm Schneider, DO  [facility]  If 7PM-7AM, please contact night-coverage [email or web address] Password [email or web address] 04/13/2024, 6:06 PM   LOS: 2 days

## 2024-04-13 NOTE — Plan of Care (Signed)

## 2024-04-13 NOTE — Anesthesia Preprocedure Evaluation (Signed)
 Anesthesia Evaluation  Patient identified by MRN, date of birth, ID band Patient awake    Reviewed: Allergy & Precautions, H&P , NPO status , Patient's Chart, lab work & pertinent test results, reviewed documented beta blocker date and time   Airway Mallampati: II  TM Distance: >3 FB Neck ROM: full    Dental no notable dental hx.    Pulmonary neg pulmonary ROS, Current Smoker and Patient abstained from smoking.   Pulmonary exam normal breath sounds clear to auscultation       Cardiovascular Exercise Tolerance: Good hypertension,  Rhythm:regular Rate:Normal     Neuro/Psych  PSYCHIATRIC DISORDERS      negative neurological ROS     GI/Hepatic negative GI ROS,,,(+) Cirrhosis   Esophageal Varices    , Hepatitis -  Endo/Other  diabetes    Renal/GU negative Renal ROS  negative genitourinary   Musculoskeletal   Abdominal   Peds  Hematology  (+) Blood dyscrasia, anemia   Anesthesia Other Findings   Reproductive/Obstetrics negative OB ROS                              Anesthesia Physical Anesthesia Plan  ASA: 3 and emergent  Anesthesia Plan: MAC   Post-op Pain Management:    Induction:   PONV Risk Score and Plan: Propofol  infusion  Airway Management Planned:   Additional Equipment:   Intra-op Plan:   Post-operative Plan:   Informed Consent: I have reviewed the patients History and Physical, chart, labs and discussed the procedure including the risks, benefits and alternatives for the proposed anesthesia with the patient or authorized representative who has indicated his/her understanding and acceptance.     Dental Advisory Given  Plan Discussed with: CRNA  Anesthesia Plan Comments:         Anesthesia Quick Evaluation

## 2024-04-13 NOTE — Brief Op Note (Signed)
 Patient underwent EGD  under propofol  sedation.  Tolerated the procedure adequately.   FINDINGS:  - Grade III and large ( > 5 mm) esophageal varices. Incompletely eradicated. Banded.  - Portal hypertensive gastropathy. Biopsied. - Normal duodenal bulb and second portion of the duodenum.  RECOMMENDATIONS  -Clear liquid diet today PPI BID for 4- 6 weeks than daily  -Recommend carvediolol 3. 125mg  BID and titrate up as tolerated ( SBP> ) . Can stop nadolol   -Repeat EGD on 2- 4 weeks until varices eradication; after which subsequent EGDs can be performed 3- 6 months than every 6- 12 months thereafter  -Absolute ETOH cessation -Continue antibiotics -Consider for transplant evaluation  -If Significant bleeding is encountered would obtain IR consult for TIPS   Jaiyanna Safran Faizan Luwanna Brossman, MD Gastroenterology and Hepatology [facility]

## 2024-04-13 NOTE — Interval H&P Note (Signed)
 History and Physical Interval Note:  04/13/2024 10:28 AM  Craig Andrews  has presented today for surgery, with the diagnosis of melena, coffee ground emesis, history of esophageal varices.  The various methods of treatment have been discussed with the patient and family. After consideration of risks, benefits and other options for treatment, the patient has consented to  Procedures: EGD (ESOPHAGOGASTRODUODENOSCOPY) (N/A) as a surgical intervention.  The patient's history has been reviewed, patient examined, no change in status, stable for surgery.  I have reviewed the patient's chart and labs.  Questions were answered to the patient's satisfaction.    EGD with banding   Patient was on NSBB before but lost insurance and stopped all meds. none used in six months. I had discussion about alcohol cessation  If large varices or stigmata of bleed is encountered will band for secondary prophylaxis as previously was banded few years back with Dr Eartha  I thoroughly discussed with the patient the procedure, including the risks involved. Patient understands what the procedure involves including the benefits and any risks. Patient understands alternatives to the proposed procedure. Risks including (but not limited to) bleeding, tearing of the lining (perforation), rupture of adjacent organs, problems with heart and lung function, infection, and medication reactions. A small percentage of complications may require surgery, hospitalization, repeat endoscopic procedure, and/or transfusion.  Patient understood and agreed.    [name] [name]

## 2024-04-13 NOTE — Transfer of Care (Signed)
 Immediate Anesthesia Transfer of Care Note  Patient: Craig Andrews  Procedure(s) Performed: EGD (ESOPHAGOGASTRODUODENOSCOPY) ESOPHAGOSCOPY, WITH ESOPHAGEAL VARICES BAND LIGATION  Patient Location: PACU  Anesthesia Type:General  Level of Consciousness: drowsy, patient cooperative, and responds to stimulation  Airway & Oxygen Therapy: Patient Spontanous Breathing and Patient connected to nasal cannula oxygen  Post-op Assessment: Report given to RN and Post -op Vital signs reviewed and stable  Post vital signs: Reviewed and stable  Last Vitals:  Vitals Value Taken Time  BP 153/96 04/13/24 10:51  Temp 37.2 C 04/13/24 10:51  Pulse 106 04/13/24 10:56  Resp 24 04/13/24 10:56  SpO2 95 % 04/13/24 10:56  Vitals shown include unfiled device data.  Last Pain:  Vitals:   04/13/24 1029  TempSrc:   PainSc: 0-No pain         Complications: No notable events documented.

## 2024-04-13 NOTE — Op Note (Signed)
 [facility] Patient Name: Craig Andrews Procedure Date: 04/13/2024 10:19 AM MRN: [id number] Date of Birth: 1980-05-23 Attending MD: Deatrice Dine , MD, [id number] CSN: [id number] Age: 43 Admit Type: Inpatient Procedure:                Upper GI endoscopy Indications:              Therapeutic procedure, Melena, Esophageal varices,                            Follow-up of esophageal varices, For therapy of                            esophageal varices, 2nd degree variceal eradication                            (following bleed); previous EV bleed with banding Providers:                Deatrice Dine, MD, Madelin Hunter, RN, Alm Dorcas Balm., Technician Referring MD:              Medicines:                Monitored Anesthesia Care Complications:            No immediate complications. Estimated Blood Loss:     Estimated blood loss was minimal. Procedure:                Pre-Anesthesia Assessment:                           - Prior to the procedure, a History and Physical                            was performed, and patient medications and                            allergies were reviewed. The patient's tolerance of                            previous anesthesia was also reviewed. The risks                            and benefits of the procedure and the sedation                            options and risks were discussed with the patient.                            All questions were answered, and informed consent                            was obtained. Prior Anticoagulants: The patient has  taken no anticoagulant or antiplatelet agents. ASA                            Grade Assessment: III - A patient with severe                            systemic disease. After reviewing the risks and                            benefits, the patient was deemed in satisfactory                            condition to undergo the procedure.                            After obtaining informed consent, the endoscope was                            passed under direct vision. Throughout the                            procedure, the patient's blood pressure, pulse, and                            oxygen saturations were monitored continuously. The                            Endoscope was introduced through the mouth, and                            advanced to the second part of duodenum. The upper                            GI endoscopy was accomplished without difficulty.                            The patient tolerated the procedure well. Scope In: 10:36:06 AM Scope Out: 10:47:50 AM Total Procedure Duration: 0 hours 11 minutes 44 seconds  Findings:      Three columns of grade III, large (> 5 mm) varices were found in the       entire esophagus. Red wale signs were present. The varices appeared       larger than they were at prior exam. Six bands were successfully placed       with incomplete eradication of varices. There was no bleeding during the       procedure.      Moderate portal hypertensive gastropathy was found in the entire       examined stomach. Biopsies were taken with a cold forceps for histology.      The duodenal bulb and second portion of the duodenum were normal. Impression:               - Grade III and large (> 5 mm) esophageal varices.  Incompletely eradicated. Banded.                           - Portal hypertensive gastropathy. Biopsied.                           - Normal duodenal bulb and second portion of the                            duodenum. Moderate Sedation:      Per Anesthesia Care Recommendation:           Clear liquid diet today                           PPI BID for 4-6 weeks than daily                           Recommend carvediolol 3.125mg  BID and titrate up as                            tolerated (SBP>33mmHG)                           Repeat EGD on 2-4 weeks until varices  eradication;                            after which subsequent EGDs can be performed 3-6                            months than every 6-12 months thereafter                           Absolute ETOH cessation Procedure Code(s):        --- Professional ---                           [phone], Esophagogastroduodenoscopy, flexible,                            transoral; with band ligation of esophageal/gastric                            varices                           43239, Esophagogastroduodenoscopy, flexible,                            transoral; with biopsy, single or multiple Diagnosis Code(s):        --- Professional ---                           I85.00, Esophageal varices without bleeding                           K76.6, Portal hypertension  K31.89, Other diseases of stomach and duodenum                           K92.1, Melena (includes Hematochezia) CPT copyright 2022 American Medical Association. All rights reserved. The codes documented in this report are preliminary and upon coder review may  be revised to meet current compliance requirements. Deatrice Dine, MD Deatrice Dine, MD 04/13/2024 10:59:59 AM This report has been signed electronically. Number of Addenda: 0

## 2024-04-13 NOTE — Telephone Encounter (Signed)
 Patient had EGD with variceal banding while inpatient today.   Dr. Cinderella advises direct scheduling of repeat EGD with variceal banding with him in 4 weeks. Please schedule. He wants date determined prior to patient's discharge.  -ASA 3, room 3 -hold metformin  morning of procedure

## 2024-04-13 NOTE — Progress Notes (Signed)
Patient off the floor with pre-op team.

## 2024-04-13 NOTE — Inpatient Diabetes Management (Signed)
 Inpatient Diabetes Program Recommendations  AACE/ADA: New Consensus Statement on Inpatient Glycemic Control (2015)  Target Ranges:  Prepandial:   less than 140 mg/dL      Peak postprandial:   less than 180 mg/dL (1-2 hours)      Critically ill patients:  140 - 180 mg/dL   Lab Results  Component Value Date   GLUCAP 113 (H) 04/13/2024   HGBA1C 7.3 (H) 04/11/2024    Latest Reference Range & Units 04/12/24 08:15 04/12/24 11:49 04/12/24 16:20 04/12/24 21:03 04/13/24 07:38  Glucose-Capillary 70 - 99 mg/dL 820 (H) 817 (H) 820 (H) 185 (H) 113 (H)  (H): Data is abnormally high  Diabetes history: DM2 Outpatient Diabetes medications:  Metformin  500 mg bid (not taking) Current orders for Inpatient glycemic control: Lantus  10 units nightly Novolog  0-15 units tid, 0-5 units hs  Inpatient Diabetes Program Recommendations:   Spoke with patient regarding diabetes management via phone (DM coordinator working remotely from campus). Patient states he does not think that he has diabetes due to he has been drinking alcohol daily for quite some time and intends to now stop. Reviewed with him A1c of 7.3 (blood glucose 163 over the past 2-3 months). Ordered Living Well with Diabetes and patient agrees to review.  Thank you, Sasha Rogel E. Jantzen Pilger, RN, MSN, CNS, CDCES  Diabetes Coordinator Inpatient Glycemic Control Team Team Pager [phone] (8am-5pm) 04/13/2024 9:01 AM

## 2024-04-13 NOTE — Progress Notes (Signed)
 Patient had BM in the bed.  Ambulated to bathroom where patient was cleaned up.  RN will escort patient back to his room.

## 2024-04-14 ENCOUNTER — Encounter (HOSPITAL_COMMUNITY): Payer: Self-pay | Admitting: Gastroenterology

## 2024-04-14 ENCOUNTER — Telehealth: Payer: Self-pay | Admitting: Gastroenterology

## 2024-04-14 ENCOUNTER — Ambulatory Visit: Payer: Self-pay | Admitting: Family Medicine

## 2024-04-14 DIAGNOSIS — R911 Solitary pulmonary nodule: Secondary | ICD-10-CM

## 2024-04-14 DIAGNOSIS — K7030 Alcoholic cirrhosis of liver without ascites: Secondary | ICD-10-CM

## 2024-04-14 DIAGNOSIS — K921 Melena: Secondary | ICD-10-CM

## 2024-04-14 DIAGNOSIS — K922 Gastrointestinal hemorrhage, unspecified: Secondary | ICD-10-CM

## 2024-04-14 LAB — COMPREHENSIVE METABOLIC PANEL WITH GFR
ALT: 83 U/L — ABNORMAL HIGH (ref 0–44)
AST: 234 U/L — ABNORMAL HIGH (ref 15–41)
Albumin: 2.9 g/dL — ABNORMAL LOW (ref 3.5–5.0)
Alkaline Phosphatase: 169 U/L — ABNORMAL HIGH (ref 38–126)
Anion gap: 8 (ref 5–15)
BUN: <5 mg/dL — ABNORMAL LOW (ref 6–20)
CO2: 29 mmol/L (ref 22–32)
Calcium: 7.9 mg/dL — ABNORMAL LOW (ref 8.9–10.3)
Chloride: 101 mmol/L (ref 98–111)
Creatinine, Ser: 0.61 mg/dL (ref 0.61–1.24)
GFR, Estimated: >60 mL/min (ref >60)
Glucose, Bld: 75 mg/dL (ref 70–99)
Potassium: 3.5 mmol/L (ref 3.5–5.1)
Sodium: 138 mmol/L (ref 135–145)
Total Bilirubin: 10.9 mg/dL — ABNORMAL HIGH (ref 0.0–1.2)
Total Protein: 7.2 g/dL (ref 6.5–8.1)

## 2024-04-14 LAB — SURGICAL PATHOLOGY

## 2024-04-14 LAB — CBC
HCT: 40.6 % (ref 39.0–52.0)
Hemoglobin: 14.1 g/dL (ref 13.0–17.0)
MCH: 35.6 pg — ABNORMAL HIGH (ref 26.0–34.0)
MCHC: 34.7 g/dL (ref 30.0–36.0)
MCV: 102.5 fL — ABNORMAL HIGH (ref 80.0–100.0)
Platelets: 127 K/uL — ABNORMAL LOW (ref 150–400)
RBC: 3.96 MIL/uL — ABNORMAL LOW (ref 4.22–5.81)
RDW: 16 % — ABNORMAL HIGH (ref 11.5–15.5)
WBC: 7.7 K/uL (ref 4.0–10.5)
nRBC: 0 % (ref 0.0–0.2)

## 2024-04-14 LAB — PROTIME-INR
INR: 1.3 — ABNORMAL HIGH (ref 0.8–1.2)
Prothrombin Time: 16.8 s — ABNORMAL HIGH (ref 11.4–15.2)

## 2024-04-14 LAB — GLUCOSE, CAPILLARY
Glucose-Capillary: 109 mg/dL — ABNORMAL HIGH (ref 70–99)
Glucose-Capillary: 164 mg/dL — ABNORMAL HIGH (ref 70–99)

## 2024-04-14 LAB — HEPATITIS PANEL, ACUTE
HCV Ab: NONREACTIVE
Hep A IgM: NONREACTIVE
Hep B C IgM: NONREACTIVE
Hepatitis B Surface Ag: NONREACTIVE

## 2024-04-14 LAB — MAGNESIUM: Magnesium: 2.4 mg/dL (ref 1.7–2.4)

## 2024-04-14 MED ORDER — METRONIDAZOLE 500 MG PO TABS: 500.0000 mg | ORAL_TABLET | Freq: Two times a day (BID) | ORAL | Status: DC

## 2024-04-14 MED ORDER — PANTOPRAZOLE SODIUM 40 MG PO TBEC: 40.0000 mg | DELAYED_RELEASE_TABLET | Freq: Two times a day (BID) | ORAL | Status: DC

## 2024-04-14 MED ORDER — CARVEDILOL 3.125 MG PO TABS: 3.1250 mg | ORAL_TABLET | Freq: Two times a day (BID) | ORAL | 1 refills | Status: AC

## 2024-04-14 MED ORDER — METRONIDAZOLE 500 MG PO TABS: 500.0000 mg | ORAL_TABLET | Freq: Two times a day (BID) | ORAL | 0 refills | Status: DC

## 2024-04-14 MED ORDER — POTASSIUM CHLORIDE 20 MEQ PO PACK
40.0000 meq | PACK | Freq: Once | ORAL | Status: AC
  Administered 2024-04-14: 40 meq via ORAL
  Filled 2024-04-14: qty 2

## 2024-04-14 MED ORDER — PANTOPRAZOLE SODIUM 40 MG PO TBEC: 40.0000 mg | DELAYED_RELEASE_TABLET | Freq: Two times a day (BID) | ORAL | 1 refills | Status: DC

## 2024-04-14 MED ORDER — METFORMIN HCL 500 MG PO TABS: 500.0000 mg | ORAL_TABLET | Freq: Two times a day (BID) | ORAL | 1 refills | Status: AC

## 2024-04-14 MED ORDER — FOLIC ACID 1 MG PO TABS: 1.0000 mg | ORAL_TABLET | Freq: Every day | ORAL | Status: AC

## 2024-04-14 MED ORDER — CEFDINIR 300 MG PO CAPS: 300.0000 mg | ORAL_CAPSULE | Freq: Two times a day (BID) | ORAL | 0 refills | Status: DC

## 2024-04-14 MED ADMIN — Carvedilol Tab 3.125 MG: 3.125 mg | ORAL | NDC 68382009201

## 2024-04-14 NOTE — Telephone Encounter (Signed)
 Patient needs a non contrast chest CT Dx: right lower lobe lung nodule  He also needs to be NIC'd for repeat MRI liver in 3 months Dx: liver lesions, cirrhosis (he will follow with [facility] and Dr. Cinderella - please recall under one of them).

## 2024-04-14 NOTE — Discharge Summary (Signed)
 Physician Discharge Summary   Patient: Craig Andrews MRN: [id number] DOB: [birth year]  Admit date:     04/11/2024  Discharge date: 04/14/2024  Discharge Physician: Alm Allysia Ingles   PCP: Harl Jayson CROME, MD (Inactive)   Recommendations at discharge:   Please follow up with primary care provider within 1-2 weeks  Please repeat BMP and CBC in one week    Hospital Course: History per Dr. Dicie Carwin:   43 y.o. male with medical history significant for alcoholic liver cirrhosis, esophageal varices, spontaneous bacterial peritonitis, diabetes mellitus. Patient presented to the ED with complaints of lower abdominal pain, yellowing of his skin.  No nausea no vomiting, no diarrhea.  Ongoing alcohol abuse, last drink was earlier today at about 2:30 PM.  He drinks vodka, reports he is trying to cut down.  He reports 2 episodes of black stools about a week ago.  Reports he lost his insurance, so he has not been able to get or take his medications which include nadolol , metformin , spironolactone . Patient states that he has never taken insulin .  He has previously metformin .  He continues to drink on a daily basis.  He drinks more liquid on a daily basis.  He smokes 1/2 pack/day for the last 25 years.   ED Course: Temperature 98.2.  Heart rate 97-104.  Respirate rate 19-20.  Blood pressure systolic 120-137.  O2 sats 91 to 96% on room air. Sodium 132, potassium 2.7.  Anion gap of 17, serum bicarb of 20.  Blood glucose 407. AST 307, ALT 108, ALP 249, T. bili of 7.5. Blood alcohol level 207. CT abdomen pelvis with contrast -cirrhotic liver, numerous hypodense liver nodules MRI recommended to exclude underlying hepatocellular carcinoma.  Extensive upper abdominal varices.  Diffuse colonic wall thickening nonspecific-infectious/inflammatory colitis versus portal colopathy.  Distended gallbladder-no evidence of cholelithiasis or cholecystitis. GI consulted, recommended checking AFP, will send consult in  a.m.  Assessment and Plan:  DKA type 2 -patient started on IV insulin  with q 1 hour CBG check and q 4 hour BMPs -pt started on aggressive fluid resuscitation -Electrolytes were monitored and repleted -transitioned to [place] insulin  once anion gap closed -diet was advanced once anion gap closed -12/9 HbA1C--7.3 -change lantus  to 10 units at hs - d/c home with metformin  500 bid - follow up with PCP for adjustments to therapy   Melena/Esophageal varices 12/11 EGD--- Grade III and large ( > 5 mm) esophageal varices. Incompletely eradicated. Banded. Portal hypertensive gastropathy -continue PPI bid -clear liquids>>soft diet which pt tolerated -start coreg   Alcohol abuse/intoxication - Alcohol level 207 at the time of admission - Continue CIWA protocol - no signs of withdrawal   Transaminasemia -04/11/2024 CT AP--cirrhotic liver with hypodense nodules, trace ascites, upper abdominal varices, diffuse colonic wall thickening - Viral hepatitis panel -no RUQ pain -tolerating diet -overall improving   Liver cirrhosis with portal hypertension - Continue IV pantoprazole  - Incidental hypodense nodules noted on the liver on CT - GI consulted - Follow-up alpha-fetoprotein--2.8 -12/11 MR liver--Markedly heterogeneous and nodular liver compatible cirrhosis. Observations in the medial segment left liver compatible with LI-RADS category 3, intermediate probability of malignancy. Follow-up MRI in 3 months recommended.   Hypokalemia - Repeating - Check magnesium --1.7   Colonic wall thickening - GI consult - Suspect portal hypertensive colopathy - d/c home with cefdinir and metronidazole  x 4 more days to complete 1 week   Thrombocytopenia - Secondary to alcoholic liver cirrhosis - Serum B12 2939 - Folic acid  10.8   Tobacco  abuse - Tobacco cessation discussed  Right lung nodule -incidental finding on MR liver -out pt surveillance         Consultants: GI Procedures performed:  EGD  Disposition: Home Diet recommendation:  Carb modified diet DISCHARGE MEDICATION: Allergies as of 04/14/2024   No Known Allergies      Medication List     STOP taking these medications    diphenhydrAMINE 25 MG tablet Commonly known as: BENADRYL   diphenhydramine-acetaminophen  25-500 MG Tabs tablet Commonly known as: TYLENOL  PM   furosemide  20 MG tablet Commonly known as: LASIX    ibuprofen  200 MG tablet Commonly known as: ADVIL    nadolol  40 MG tablet Commonly known as: CORGARD    omeprazole  20 MG tablet Commonly known as: PriLOSEC  OTC   spironolactone  50 MG tablet Commonly known as: ALDACTONE        TAKE these medications    Blood Glucose Monitoring Suppl Devi 1 each by Does not apply route in the morning, at noon, and at bedtime. May substitute to any manufacturer covered by patient's insurance.   carvedilol 3.125 MG tablet Commonly known as: COREG Take 1 tablet (3.125 mg total) by mouth 2 (two) times daily with a meal.   cefdinir 300 MG capsule Commonly known as: OMNICEF Take 1 capsule (300 mg total) by mouth 2 (two) times daily.   cetirizine 10 MG tablet Commonly known as: ZYRTEC Take 10 mg by mouth at bedtime.   folic acid  1 MG tablet Commonly known as: FOLVITE  Take 1 tablet (1 mg total) by mouth daily. Start taking on: April 15, 2024   lactulose  10 GM/15ML solution Commonly known as: CHRONULAC  TAKE 15 ML BY MOUTH  TWICE DAILY What changed: See the new instructions.   metFORMIN  500 MG tablet Commonly known as: GLUCOPHAGE  Take 1 tablet (500 mg total) by mouth 2 (two) times daily with a meal.   metroNIDAZOLE  500 MG tablet Commonly known as: FLAGYL  Take 1 tablet (500 mg total) by mouth every 12 (twelve) hours.   multivitamin with minerals Tabs tablet Take 1 tablet by mouth daily.   pantoprazole  40 MG tablet Commonly known as: PROTONIX  Take 1 tablet (40 mg total) by mouth 2 (two) times daily.        Follow-up Information      [facility]. Call.   Contact information: ( OutPatient treatment Plan) [address] [place], [place] Walk in - [phone]- Fri) 5:30am-11AM Schedule appointment [phone]               Discharge Exam: Fredricka Weights   04/11/24 2134 04/12/24 0553 04/12/24 1106  Weight: 86.9 kg 88 kg 89.8 kg   HEENT:  [place]/AT, No thrush, no icterus CV:  RRR, no rub, no S3, no S4 Lung:  CTA, no wheeze, no rhonchi Abd:  soft/+BS, NT Ext:  No edema, no lymphangitis, no synovitis, no rash   Condition at discharge: stable  The results of significant diagnostics from this hospitalization (including imaging, microbiology, ancillary and laboratory) are listed below for reference.   Imaging Studies: MR LIVER W WO CONTRAST Result Date: 04/13/2024 CLINICAL DATA:  Liver lesions on CT. EXAM: MRI ABDOMEN WITHOUT AND WITH CONTRAST TECHNIQUE: Multiplanar multisequence MR imaging of the abdomen was performed both before and after the administration of intravenous contrast. CONTRAST:  9mL GADAVIST  GADOBUTROL  1 MMOL/ML IV SOLN COMPARISON:  CT scan 1 day prior. FINDINGS: Lower chest: 17 mm subpleural nodule identified posterior right lung on coronal image 27 of series 3 and axial image 5 of  series 12. Hepatobiliary: Liver measures 23 cm craniocaudal length with diffuse nodular signal intensity. Nodularity of the contour associated. Features compatible cirrhosis. No focal restricted diffusion within the liver parenchyma. No focal arterial phase hyperenhancement. Areas of subcapsular heterogeneous enhancement are identified in the lateral segment left liver and inferior right liver as well as along the gallbladder fossa. 2.9 x 2.0 cm subcapsular observation in the medial segment left liver along the gallbladder fossa (image 81/14) shows some subtle posterior irregular arterial phase hyperenhancement although this is only minimal. Lesion hypo enhances relative to background liver parenchyma and the posterior  hyperenhancement does not appear to washout. A second similar observation in the medial segment left liver along the gallbladder fossa measures 2.9 x 2.1 cm on image 81/14. No definite non peripheral arterial phase hyperenhancement although the lesion does hypo enhance relative to background liver parenchyma. Gallbladder is distended with gallbladder wall irregularity and gallbladder wall edema/pericholecystic fluid. No definite gallstones. No intrahepatic or extrahepatic biliary dilation. Pancreas: No focal mass lesion. No dilatation of the main duct. No intraparenchymal cyst. No peripancreatic edema. Spleen:  No splenomegaly. No suspicious focal mass lesion. Adrenals/Urinary Tract: No adrenal nodule or mass. Kidneys unremarkable. Stomach/Bowel: Stomach is unremarkable. No gastric wall thickening. No evidence of outlet obstruction. Duodenum is normally positioned as is the ligament of Treitz. No small bowel or colonic dilatation within the visualized abdomen. Vascular/Lymphatic: No abdominal aortic aneurysm. Portal vein and superior mesenteric vein are patent. Splenic vein is patent. No lymphadenopathy in the abdomen. Other: Diffuse mesenteric edema is associated with small volume ascites. Musculoskeletal: No focal suspicious marrow enhancement within the visualized bony anatomy. IMPRESSION: 1. Markedly heterogeneous and nodular liver compatible cirrhosis. Observations in the medial segment left liver compatible with LI-RADS category 3, intermediate probability of malignancy. Follow-up MRI in 3 months recommended. 2. Cirrhosis with small volume ascites. 3. Distended gallbladder with gallbladder wall irregularity and gallbladder wall edema/pericholecystic fluid. No definite gallstones. Appearance is nonspecific in the setting of liver insufficiency. 4. 17 mm subpleural nodule posterior right lung. CT chest without contrast recommended to further evaluate. Electronically Signed   By: Camellia Candle M.D.   On:  04/13/2024 08:17   CT ABDOMEN PELVIS W CONTRAST Result Date: 04/11/2024 CLINICAL DATA:  Cirrhosis, jaundice, abdominal pain EXAM: CT ABDOMEN AND PELVIS WITH CONTRAST TECHNIQUE: Multidetector CT imaging of the abdomen and pelvis was performed using the standard protocol following bolus administration of intravenous contrast. RADIATION DOSE REDUCTION: This exam was performed according to the departmental dose-optimization program which includes automated exposure control, adjustment of the mA and/or kV according to patient size and/or use of iterative reconstruction technique. CONTRAST:  OMNIPAQUE  IOHEXOL  300 MG/ML  SOLN COMPARISON:  04/16/2021 FINDINGS: Lower chest: No acute pleural or parenchymal lung disease. Hepatobiliary: There is marked heterogeneity of the liver parenchyma, compatible with cirrhosis. Numerous hypodense nodules are seen throughout the liver, indeterminate on this single phase exam. Correlation with nonemergent outpatient dedicated liver MRI with without contrast is recommended for further characterization and to assess for underlying malignancy. No biliary duct dilation. Marked distension of the gallbladder without evidence of cholelithiasis or cholecystitis. Pancreas: Unremarkable. No pancreatic ductal dilatation or surrounding inflammatory changes. Spleen: Normal in size without focal abnormality. Adrenals/Urinary Tract: Adrenal glands are unremarkable. Kidneys are normal, without renal calculi, focal lesion, or hydronephrosis. Bladder is unremarkable. Stomach/Bowel: No bowel obstruction or ileus. There is nonspecific diffuse colonic wall thickening, most pronounced within the cecum and ascending colon. Findings could reflect inflammatory or infectious colitis, though  portal colopathy could give a similar appearance given the presence of cirrhosis. Normal appendix right lower quadrant. Vascular/Lymphatic: Atherosclerosis of the abdominal aorta. There is evidence of portal venous  hypertension, with enlarged main portal vein, recanalization of the umbilical vein, as well as numerous upper abdominal varices primarily within the stomach and distal esophagus. No pathologic adenopathy. Reproductive: Prostate is unremarkable. Other: Trace ascites. No free intraperitoneal gas. No abdominal wall hernia. Musculoskeletal: No acute or destructive bony abnormalities. Reconstructed images demonstrate no additional findings. IMPRESSION: 1. Cirrhotic morphology of the liver, with numerous hypodense nodules throughout the liver parenchyma. When the patient is clinically stable and able to follow directions and hold their breath (preferably as an outpatient) further evaluation with dedicated abdominal MRI should be considered for further characterization and to exclude underlying hepatocellular carcinoma. 2. Portal venous hypertension, manifested by trace ascites, recanalization of the umbilical vein, and extensive upper abdominal varices. 3. Diffuse colonic wall thickening, nonspecific. Differential diagnosis would include inflammatory/infectious colitis versus portal colopathy. 4. Distended gallbladder, with no evidence of cholelithiasis or cholecystitis. 5.  Aortic Atherosclerosis (ICD10-I70.0). Electronically Signed   By: Ozell Daring M.D.   On: 04/11/2024 19:18    Microbiology: Results for orders placed or performed during the hospital encounter of 04/11/24  MRSA Next Gen by PCR, Nasal     Status: None   Collection Time: 04/11/24  9:11 PM   Specimen: Nasal Mucosa; Nasal Swab  Result Value Ref Range Status   MRSA by PCR Next Gen NOT DETECTED NOT DETECTED Final    Comment: (NOTE) The GeneXpert MRSA Assay (FDA approved for NASAL specimens only), is one component of a comprehensive MRSA colonization surveillance program. It is not intended to diagnose MRSA infection nor to guide or monitor treatment for MRSA infections. Test performance is not FDA approved in patients less than 7  years old. Performed at [facility], [address], [place], [place] [address]     Labs: CBC: Recent Labs  Lab 04/11/24 1723 04/12/24 0731 04/13/24 0519 04/14/24 0357  WBC 6.9 7.0 5.4 7.7  NEUTROABS 4.8  --   --   --   HGB 14.7 13.8 13.9 14.1  HCT 41.3 38.9* 39.1 40.6  MCV 99.8 99.5 100.5* 102.5*  PLT 104* 93* 92* 127*   Basic Metabolic Panel: Recent Labs  Lab 04/11/24 1723 04/11/24 2102 04/12/24 0731 04/13/24 0519 04/14/24 0357  NA 132*  --  135 138 138  K 2.7*  --  2.9* 3.0* 3.5  CL 94*  --  99 101 101  CO2 20*  --  24 29 29   GLUCOSE 407*  --  141* 96 75  BUN <5*  --  <5* <5* <5*  CREATININE 0.55*  --  0.53* 0.62 0.61  CALCIUM 8.0*  --  7.7* 7.9* 7.9*  MG  --  2.0 1.8 1.7 2.4   Liver Function Tests: Recent Labs  Lab 04/11/24 1723 04/12/24 0731 04/13/24 0519 04/14/24 0357  AST 307* 307* 276* 234*  ALT 108* 99* 93* 83*  ALKPHOS 249* 225* 189* 169*  BILITOT 7.5* 7.6* 9.4* 10.9*  PROT 8.1 7.5 7.3 7.2  ALBUMIN  3.2* 2.9* 2.9* 2.9*   CBG: Recent Labs  Lab 04/13/24 1230 04/13/24 1602 04/13/24 1939 04/14/24 0729 04/14/24 1140  GLUCAP 146* 111* 214* 109* 164*    Discharge time spent: greater than 30 minutes.  Signed: Alm Schneider, MD [facility] 04/14/2024

## 2024-04-14 NOTE — Progress Notes (Signed)
 Gastroenterology Progress Note   Referring Provider: No ref. provider found Primary Care Physician:  Harl Jayson CROME, MD (Inactive) Primary Gastroenterologist:  Toribio Rubins, MD (likely transition to Dr. Cinderella)  Patient ID: Craig Andrews; [id number]; August 24, 1980    Subjective   Doing well this morning. Has had liquids and tolerating but does not really like them. No abdominal pain and BM this morning that was non melanotic. Endorses he really wants to stay sober and be as healthy as possible going forward. Denis any withdrawal symptoms. States he wants to quit smoking as well. He is appreciative of the care he has received.    Objective   Vital signs in last 24 hours Temp:  [98.2 F (36.8 C)-99.2 F (37.3 C)] 98.4 F (36.9 C) (12/12 0358) Pulse Rate:  [72-107] 72 (12/12 0358) Resp:  [15-24] 19 (12/12 0358) BP: (114-153)/(77-97) 121/89 (12/12 0358) SpO2:  [92 %-100 %] 95 % (12/12 0358) Last BM Date : 04/14/24  Physical Exam General:   Alert and oriented, pleasant. Jaundice.  Head:  Normocephalic and atraumatic. Eyes:  mild icterus, sclera clear. Conjuctiva pink.  Mouth:  Without lesions, mucosa pink and moist.  Neck:  Supple, without thyromegaly or masses.  Heart:  S1, S2 present, no murmurs noted.  Lungs: Clear to auscultation bilaterally, without wheezing, rales, or rhonchi.  Abdomen:  Bowel sounds present, soft, non-tender, mildly distended.  No rebound or guarding. No masses appreciated  Msk:  Symmetrical without gross deformities. Normal posture. Extremities:  Without clubbing or edema. Neurologic:  Alert and  oriented x4;  grossly normal neurologically.No asterixis.  Skin:  Warm and dry, intact without significant lesions.  Psych:  Alert and cooperative. Normal mood and affect.  Intake/Output from previous day: 12/11 0701 - 12/12 0700 In: 640 [P.O.:240; IV Piggyback:400] Out: -  Intake/Output this shift: Total I/O In: 780 [P.O.:780] Out: -    Lab Results  Recent Labs    04/12/24 0731 04/13/24 0519 04/14/24 0357  WBC 7.0 5.4 7.7  HGB 13.8 13.9 14.1  HCT 38.9* 39.1 40.6  PLT 93* 92* 127*   BMET Recent Labs    04/12/24 0731 04/13/24 0519 04/14/24 0357  NA 135 138 138  K 2.9* 3.0* 3.5  CL 99 101 101  CO2 24 29 29   GLUCOSE 141* 96 75  BUN <5* <5* <5*  CREATININE 0.53* 0.62 0.61  CALCIUM 7.7* 7.9* 7.9*   LFT Recent Labs    04/12/24 0731 04/13/24 0519 04/14/24 0357  PROT 7.5 7.3 7.2  ALBUMIN  2.9* 2.9* 2.9*  AST 307* 276* 234*  ALT 99* 93* 83*  ALKPHOS 225* 189* 169*  BILITOT 7.6* 9.4* 10.9*  BILIDIR 6.1*  --   --   IBILI 1.5*  --   --    PT/INR Recent Labs    04/13/24 0519 04/14/24 0357  LABPROT 16.3* 16.8*  INR 1.2 1.3*   Hepatitis Panel No results for input(s): HEPBSAG, HCVAB, HEPAIGM, HEPBIGM in the last 72 hours.  Studies/Results MR LIVER W WO CONTRAST Result Date: 04/13/2024 CLINICAL DATA:  Liver lesions on CT. EXAM: MRI ABDOMEN WITHOUT AND WITH CONTRAST TECHNIQUE: Multiplanar multisequence MR imaging of the abdomen was performed both before and after the administration of intravenous contrast. CONTRAST:  9mL GADAVIST  GADOBUTROL  1 MMOL/ML IV SOLN COMPARISON:  CT scan 1 day prior. FINDINGS: Lower chest: 17 mm subpleural nodule identified posterior right lung on coronal image 27 of series 3 and axial image 5 of series 12. Hepatobiliary: Liver measures 23 cm  craniocaudal length with diffuse nodular signal intensity. Nodularity of the contour associated. Features compatible cirrhosis. No focal restricted diffusion within the liver parenchyma. No focal arterial phase hyperenhancement. Areas of subcapsular heterogeneous enhancement are identified in the lateral segment left liver and inferior right liver as well as along the gallbladder fossa. 2.9 x 2.0 cm subcapsular observation in the medial segment left liver along the gallbladder fossa (image 81/14) shows some subtle posterior irregular  arterial phase hyperenhancement although this is only minimal. Lesion hypo enhances relative to background liver parenchyma and the posterior hyperenhancement does not appear to washout. A second similar observation in the medial segment left liver along the gallbladder fossa measures 2.9 x 2.1 cm on image 81/14. No definite non peripheral arterial phase hyperenhancement although the lesion does hypo enhance relative to background liver parenchyma. Gallbladder is distended with gallbladder wall irregularity and gallbladder wall edema/pericholecystic fluid. No definite gallstones. No intrahepatic or extrahepatic biliary dilation. Pancreas: No focal mass lesion. No dilatation of the main duct. No intraparenchymal cyst. No peripancreatic edema. Spleen:  No splenomegaly. No suspicious focal mass lesion. Adrenals/Urinary Tract: No adrenal nodule or mass. Kidneys unremarkable. Stomach/Bowel: Stomach is unremarkable. No gastric wall thickening. No evidence of outlet obstruction. Duodenum is normally positioned as is the ligament of Treitz. No small bowel or colonic dilatation within the visualized abdomen. Vascular/Lymphatic: No abdominal aortic aneurysm. Portal vein and superior mesenteric vein are patent. Splenic vein is patent. No lymphadenopathy in the abdomen. Other: Diffuse mesenteric edema is associated with small volume ascites. Musculoskeletal: No focal suspicious marrow enhancement within the visualized bony anatomy. IMPRESSION: 1. Markedly heterogeneous and nodular liver compatible cirrhosis. Observations in the medial segment left liver compatible with LI-RADS category 3, intermediate probability of malignancy. Follow-up MRI in 3 months recommended. 2. Cirrhosis with small volume ascites. 3. Distended gallbladder with gallbladder wall irregularity and gallbladder wall edema/pericholecystic fluid. No definite gallstones. Appearance is nonspecific in the setting of liver insufficiency. 4. 17 mm subpleural nodule  posterior right lung. CT chest without contrast recommended to further evaluate. Electronically Signed   By: Camellia Candle M.D.   On: 04/13/2024 08:17   CT ABDOMEN PELVIS W CONTRAST Result Date: 04/11/2024 CLINICAL DATA:  Cirrhosis, jaundice, abdominal pain EXAM: CT ABDOMEN AND PELVIS WITH CONTRAST TECHNIQUE: Multidetector CT imaging of the abdomen and pelvis was performed using the standard protocol following bolus administration of intravenous contrast. RADIATION DOSE REDUCTION: This exam was performed according to the departmental dose-optimization program which includes automated exposure control, adjustment of the mA and/or kV according to patient size and/or use of iterative reconstruction technique. CONTRAST:  OMNIPAQUE  IOHEXOL  300 MG/ML  SOLN COMPARISON:  04/16/2021 FINDINGS: Lower chest: No acute pleural or parenchymal lung disease. Hepatobiliary: There is marked heterogeneity of the liver parenchyma, compatible with cirrhosis. Numerous hypodense nodules are seen throughout the liver, indeterminate on this single phase exam. Correlation with nonemergent outpatient dedicated liver MRI with without contrast is recommended for further characterization and to assess for underlying malignancy. No biliary duct dilation. Marked distension of the gallbladder without evidence of cholelithiasis or cholecystitis. Pancreas: Unremarkable. No pancreatic ductal dilatation or surrounding inflammatory changes. Spleen: Normal in size without focal abnormality. Adrenals/Urinary Tract: Adrenal glands are unremarkable. Kidneys are normal, without renal calculi, focal lesion, or hydronephrosis. Bladder is unremarkable. Stomach/Bowel: No bowel obstruction or ileus. There is nonspecific diffuse colonic wall thickening, most pronounced within the cecum and ascending colon. Findings could reflect inflammatory or infectious colitis, though portal colopathy could give a similar appearance  given the presence of cirrhosis.  Normal appendix right lower quadrant. Vascular/Lymphatic: Atherosclerosis of the abdominal aorta. There is evidence of portal venous hypertension, with enlarged main portal vein, recanalization of the umbilical vein, as well as numerous upper abdominal varices primarily within the stomach and distal esophagus. No pathologic adenopathy. Reproductive: Prostate is unremarkable. Other: Trace ascites. No free intraperitoneal gas. No abdominal wall hernia. Musculoskeletal: No acute or destructive bony abnormalities. Reconstructed images demonstrate no additional findings. IMPRESSION: 1. Cirrhotic morphology of the liver, with numerous hypodense nodules throughout the liver parenchyma. When the patient is clinically stable and able to follow directions and hold their breath (preferably as an outpatient) further evaluation with dedicated abdominal MRI should be considered for further characterization and to exclude underlying hepatocellular carcinoma. 2. Portal venous hypertension, manifested by trace ascites, recanalization of the umbilical vein, and extensive upper abdominal varices. 3. Diffuse colonic wall thickening, nonspecific. Differential diagnosis would include inflammatory/infectious colitis versus portal colopathy. 4. Distended gallbladder, with no evidence of cholelithiasis or cholecystitis. 5.  Aortic Atherosclerosis (ICD10-I70.0). Electronically Signed   By: Craig Daring M.D.   On: 04/11/2024 19:18   Assessment  43 y.o. male with a history of alcoholic liver cirrhosis and alcoholic hepatitis with course complicated by esophageal varices, ?SBP, diabetes, HTN who presented to [facility] on 12/9 with jaundice and abdominal pain.  GI consulted for further evaluation given reports of melena elevated LFTs and jaundice in the setting of recent EtOH abuse.  Alcohol cirrhosis/Elevated LFTs/recurrent EtOH abuse/Jaundice: - Bilirubin has increased to 10.9 (normal slow to peak before improvement noted) - AST/ALT and Alk  phos down trending - MELD 3.0 today: 20 - most likely clinical picture is alcohol hepatitis in the setting of recent heavy EtOH use.  On admission his EF was noted to be 22.7 therefore no indication for steroids and his LFTs are improving.  - Jaundice is secondary to the alcohol hepatitis and rising bilirubin, should start to improve in several days given there is a lag in peak time for bilirubin before improvement noted.  Melena: - endorse history of melena on admission - Hgb stable this admission - EGD yesterday 12/12 with large grade 3 esophageal varices s/p banding without complete eradication, portal hypertensive gastropathy s/p biopsy, and normal duodenum. - Will continue PPI BID for at least 6 weeks prior to reducing to daily - Will go ahead and schedule outpatient EGD for repeat banding in 4 weeks  Liver lesions and right lung nodule: - CT with numerous hypodense lesion in the left liver measure < 3 cm.  - AFP wnl - Follow up MRI completed yesterday that classifies lesions as LI-RADS 3, confirmed cirrhosis, distended gallbladder with irregularity and edema (likely reactive), and a 17 mm supleural nodule - Given cirrhosis and presence of lung nodule this is highly suspicious for possible HCC and therefore he needs non contrast chest CT for further evaluation and early interval MRI in 3 months  Plan / Recommendations  Continue to trend LFTs PPI twice daily for 6 weeks, then daily Continue carvedilol 3.125 mg twice daily (titrate for SBP >90 and goal heart rate 50-60) Absolute EtOH cessation Continue ceftriaxone  daily for SBP prophylaxis in the setting of GI bleed, can transition to oral for total 7-day therapy on discharge. Will need ongoing outpatient follow-up in our clinic and consideration for outpatient transplant evaluation if he continues alcohol cessation Thiamine  and folate supplementation 2 g sodium diet If he has recurrent bleeding he will need to be referred for TIPS  We  have already sent a message to arrange follow-up EGD in 4 weeks with Dr. Cinderella for repeat surveillance of esophageal varices and repeat banding if needed Noncontrast chest CT Follow-up MRI outpatient in 3 months Follow up OV in 4-6 weeks (just before or after scheduled EGD) HFP, INR one week post discharge.     LOS: 3 days   04/14/2024, 10:06 AM   Charmaine Melia, MSN, FNP-BC, AGACNP-BC [facility]

## 2024-04-14 NOTE — Plan of Care (Signed)
   Problem: Education: Goal: Knowledge of General Education information will improve Description: Including pain rating scale, medication(s)/side effects and non-pharmacologic comfort measures Outcome: Adequate for Discharge   Problem: Health Behavior/Discharge Planning: Goal: Ability to manage health-related needs will improve Outcome: Adequate for Discharge   Problem: Clinical Measurements: Goal: Ability to maintain clinical measurements within normal limits will improve Outcome: Adequate for Discharge Goal: Will remain free from infection Outcome: Adequate for Discharge

## 2024-04-14 NOTE — Telephone Encounter (Signed)
 Devere - Need hospital follow up with Dr. Cinderella or [facility] in the next 4-6 weeks. He will have EGD in about 4 weeks with Ahmed - appt can be just before or just after.   Crystal/Tanya - He follows with [facility] outpatient. Please arrange a CBC, CMP and INR in 1 week.  Dx: cirrhosis, upper GI bleed.   Charmaine Melia, MSN, APRN, FNP-BC, AGACNP-BC [facility]

## 2024-04-14 NOTE — Progress Notes (Signed)
 IV discontinued, site WNL. Discharge instructions given to patient who verbalized understanding.

## 2024-04-17 ENCOUNTER — Encounter (INDEPENDENT_AMBULATORY_CARE_PROVIDER_SITE_OTHER): Payer: Self-pay

## 2024-04-17 NOTE — Telephone Encounter (Signed)
 Lab orders placed and my chart message sent to patient. Will also mail labs

## 2024-04-17 NOTE — Addendum Note (Signed)
 Addended by: JEANELL GRAEME RAMAN on: 04/17/2024 11:31 AM   Modules accepted: Orders

## 2024-04-17 NOTE — Telephone Encounter (Signed)
 3 mth MRI noted in recall

## 2024-04-17 NOTE — Telephone Encounter (Signed)
 ATC patient to scheduled EGD with variceal banding, no answer. LVM for call back.

## 2024-04-17 NOTE — Addendum Note (Signed)
 Addended by: Lilla Callejo on: 04/17/2024 08:54 AM   Modules accepted: Orders

## 2024-04-19 NOTE — Telephone Encounter (Signed)
 Left detailed message on voicemail.

## 2024-04-21 NOTE — Anesthesia Postprocedure Evaluation (Signed)
"   Anesthesia Post Note  Patient: Craig Andrews  Procedure(s) Performed: EGD (ESOPHAGOGASTRODUODENOSCOPY) ESOPHAGOSCOPY, WITH ESOPHAGEAL VARICES BAND LIGATION  Patient location during evaluation: Phase II Anesthesia Type: MAC Level of consciousness: awake Pain management: pain level controlled Vital Signs Assessment: post-procedure vital signs reviewed and stable Respiratory status: spontaneous breathing and respiratory function stable Cardiovascular status: blood pressure returned to baseline and stable Postop Assessment: no headache and no apparent nausea or vomiting Anesthetic complications: no Comments: Late entry   No notable events documented.   Last Vitals:  Vitals:   04/14/24 0358 04/14/24 1341  BP: 121/89 (!) 126/94  Pulse: 72 89  Resp: 19 20  Temp: 36.9 C 37.4 C  SpO2: 95% 94%    Last Pain:  Vitals:   04/14/24 1341  TempSrc: Oral  PainSc:                  Yvonna PARAS Corena Tilson      "

## 2024-04-24 ENCOUNTER — Encounter (INDEPENDENT_AMBULATORY_CARE_PROVIDER_SITE_OTHER): Payer: Self-pay | Admitting: *Deleted

## 2024-04-24 NOTE — Progress Notes (Signed)
 Patient result letter mailed

## 2024-04-26 ENCOUNTER — Emergency Department (HOSPITAL_COMMUNITY): Payer: Self-pay

## 2024-04-26 ENCOUNTER — Encounter (HOSPITAL_COMMUNITY): Payer: Self-pay

## 2024-04-26 ENCOUNTER — Other Ambulatory Visit: Payer: Self-pay

## 2024-04-26 ENCOUNTER — Inpatient Hospital Stay (HOSPITAL_COMMUNITY)
Admission: EM | Admit: 2024-04-26 | Discharge: 2024-04-30 | DRG: 432 | Disposition: A | Discharge status: Home / Self Care | Payer: Self-pay | Attending: Internal Medicine | Admitting: Internal Medicine

## 2024-04-26 DIAGNOSIS — Z8249 Family history of ischemic heart disease and other diseases of the circulatory system: Secondary | ICD-10-CM

## 2024-04-26 DIAGNOSIS — I8511 Secondary esophageal varices with bleeding: Secondary | ICD-10-CM | POA: Diagnosis present

## 2024-04-26 DIAGNOSIS — Z560 Unemployment, unspecified: Secondary | ICD-10-CM

## 2024-04-26 DIAGNOSIS — R6510 Systemic inflammatory response syndrome (SIRS) of non-infectious origin without acute organ dysfunction: Principal | ICD-10-CM | POA: Diagnosis present

## 2024-04-26 DIAGNOSIS — D62 Acute posthemorrhagic anemia: Secondary | ICD-10-CM | POA: Diagnosis present

## 2024-04-26 DIAGNOSIS — K922 Gastrointestinal hemorrhage, unspecified: Secondary | ICD-10-CM | POA: Diagnosis present

## 2024-04-26 DIAGNOSIS — F17290 Nicotine dependence, other tobacco product, uncomplicated: Secondary | ICD-10-CM | POA: Diagnosis present

## 2024-04-26 DIAGNOSIS — K766 Portal hypertension: Secondary | ICD-10-CM | POA: Diagnosis present

## 2024-04-26 DIAGNOSIS — F17210 Nicotine dependence, cigarettes, uncomplicated: Secondary | ICD-10-CM | POA: Diagnosis present

## 2024-04-26 DIAGNOSIS — E872 Acidosis, unspecified: Secondary | ICD-10-CM | POA: Diagnosis present

## 2024-04-26 DIAGNOSIS — D72829 Elevated white blood cell count, unspecified: Secondary | ICD-10-CM | POA: Diagnosis present

## 2024-04-26 DIAGNOSIS — Z91148 Patient's other noncompliance with medication regimen for other reason: Secondary | ICD-10-CM

## 2024-04-26 DIAGNOSIS — R911 Solitary pulmonary nodule: Secondary | ICD-10-CM | POA: Diagnosis present

## 2024-04-26 DIAGNOSIS — K7031 Alcoholic cirrhosis of liver with ascites: Secondary | ICD-10-CM

## 2024-04-26 DIAGNOSIS — I10 Essential (primary) hypertension: Secondary | ICD-10-CM | POA: Diagnosis present

## 2024-04-26 DIAGNOSIS — F1010 Alcohol abuse, uncomplicated: Secondary | ICD-10-CM | POA: Diagnosis present

## 2024-04-26 DIAGNOSIS — Z79899 Other long term (current) drug therapy: Secondary | ICD-10-CM

## 2024-04-26 DIAGNOSIS — Z7984 Long term (current) use of oral hypoglycemic drugs: Secondary | ICD-10-CM

## 2024-04-26 DIAGNOSIS — E871 Hypo-osmolality and hyponatremia: Secondary | ICD-10-CM | POA: Diagnosis present

## 2024-04-26 DIAGNOSIS — K7030 Alcoholic cirrhosis of liver without ascites: Principal | ICD-10-CM | POA: Diagnosis present

## 2024-04-26 DIAGNOSIS — E1165 Type 2 diabetes mellitus with hyperglycemia: Secondary | ICD-10-CM | POA: Diagnosis present

## 2024-04-26 DIAGNOSIS — E876 Hypokalemia: Secondary | ICD-10-CM | POA: Diagnosis present

## 2024-04-26 DIAGNOSIS — K3189 Other diseases of stomach and duodenum: Secondary | ICD-10-CM | POA: Diagnosis present

## 2024-04-26 LAB — APTT: aPTT: 35 s (ref 24–36)

## 2024-04-26 LAB — COMPREHENSIVE METABOLIC PANEL WITH GFR
ALT: 53 U/L — ABNORMAL HIGH (ref 0–44)
AST: 137 U/L — ABNORMAL HIGH (ref 15–41)
Albumin: 2.7 g/dL — ABNORMAL LOW (ref 3.5–5.0)
Alkaline Phosphatase: 99 U/L (ref 38–126)
Anion gap: 17 — ABNORMAL HIGH (ref 5–15)
BUN: 8 mg/dL (ref 6–20)
CO2: 15 mmol/L — ABNORMAL LOW (ref 22–32)
Calcium: 7.4 mg/dL — ABNORMAL LOW (ref 8.9–10.3)
Chloride: 95 mmol/L — ABNORMAL LOW (ref 98–111)
Creatinine, Ser: 0.58 mg/dL — ABNORMAL LOW (ref 0.61–1.24)
GFR, Estimated: >60 mL/min
Glucose, Bld: 364 mg/dL — ABNORMAL HIGH (ref 70–99)
Potassium: 4.2 mmol/L (ref 3.5–5.1)
Sodium: 127 mmol/L — ABNORMAL LOW (ref 135–145)
Total Bilirubin: 5.6 mg/dL — ABNORMAL HIGH (ref 0.0–1.2)
Total Protein: 6.7 g/dL (ref 6.5–8.1)

## 2024-04-26 LAB — PROTIME-INR
INR: 1.2 (ref 0.8–1.2)
Prothrombin Time: 16.2 s — ABNORMAL HIGH (ref 11.4–15.2)

## 2024-04-26 LAB — BLOOD GAS, VENOUS
Acid-base deficit: 4.8 mmol/L — ABNORMAL HIGH (ref 0.0–2.0)
Bicarbonate: 19.7 mmol/L — ABNORMAL LOW (ref 20.0–28.0)
Drawn by: 4237
O2 Saturation: 67.4 %
Patient temperature: 36.7
pCO2, Ven: 34 mmHg — ABNORMAL LOW (ref 44–60)
pH, Ven: 7.37 (ref 7.25–7.43)
pO2, Ven: 41 mmHg (ref 32–45)

## 2024-04-26 LAB — CBC
HCT: 20.7 % — ABNORMAL LOW (ref 39.0–52.0)
Hemoglobin: 7.1 g/dL — ABNORMAL LOW (ref 13.0–17.0)
MCH: 37.4 pg — ABNORMAL HIGH (ref 26.0–34.0)
MCHC: 34.3 g/dL (ref 30.0–36.0)
MCV: 108.9 fL — ABNORMAL HIGH (ref 80.0–100.0)
Platelets: 269 K/uL (ref 150–400)
RBC: 1.9 MIL/uL — ABNORMAL LOW (ref 4.22–5.81)
RDW: 14.9 % (ref 11.5–15.5)
WBC: 22.1 K/uL — ABNORMAL HIGH (ref 4.0–10.5)
nRBC: 0.1 % (ref 0.0–0.2)

## 2024-04-26 LAB — URINALYSIS, ROUTINE W REFLEX MICROSCOPIC
Bacteria, UA: NONE SEEN
Bilirubin Urine: NEGATIVE
Glucose, UA: >=500 mg/dL — AB
Hgb urine dipstick: NEGATIVE
Ketones, ur: NEGATIVE mg/dL
Leukocytes,Ua: NEGATIVE
Nitrite: NEGATIVE
Protein, ur: NEGATIVE mg/dL
Specific Gravity, Urine: >1.046 — ABNORMAL HIGH (ref 1.005–1.030)
pH: 5 (ref 5.0–8.0)

## 2024-04-26 LAB — TROPONIN T, HIGH SENSITIVITY: Troponin T High Sensitivity: 19 ng/L (ref 0–19)

## 2024-04-26 LAB — LIPASE, BLOOD: Lipase: 75 U/L — ABNORMAL HIGH (ref 11–51)

## 2024-04-26 LAB — CBG MONITORING, ED: Glucose-Capillary: 393 mg/dL — ABNORMAL HIGH (ref 70–99)

## 2024-04-26 LAB — PRO BRAIN NATRIURETIC PEPTIDE: Pro Brain Natriuretic Peptide: <50 pg/mL

## 2024-04-26 LAB — PREPARE RBC (CROSSMATCH)

## 2024-04-26 LAB — BETA-HYDROXYBUTYRIC ACID: Beta-Hydroxybutyric Acid: 0.17 mmol/L (ref 0.05–0.27)

## 2024-04-26 LAB — LACTIC ACID, PLASMA
Lactic Acid, Venous: 5 mmol/L (ref 0.5–1.9)
Lactic Acid, Venous: 4.6 mmol/L (ref 0.5–1.9)

## 2024-04-26 MED ORDER — ONDANSETRON HCL 4 MG/2ML IJ SOLN
4.0000 mg | Freq: Once | INTRAMUSCULAR | Status: AC | PRN
  Administered 2024-04-26: 4 mg via INTRAVENOUS
  Filled 2024-04-26: qty 2

## 2024-04-26 MED ORDER — PANTOPRAZOLE SODIUM 40 MG IV SOLR
80.0000 mg | Freq: Once | INTRAVENOUS | Status: AC
  Administered 2024-04-26: 80 mg via INTRAVENOUS
  Filled 2024-04-26: qty 20

## 2024-04-26 MED ORDER — SODIUM CHLORIDE 0.9 % IV BOLUS: 500.0000 mL | Freq: Once | INTRAVENOUS | Status: DC

## 2024-04-26 MED ORDER — INSULIN ASPART 100 UNIT/ML IJ SOLN
0.0000 [IU] | INTRAMUSCULAR | Status: DC
  Administered 2024-04-27: 3 [IU] via SUBCUTANEOUS
  Administered 2024-04-27: 5 [IU] via SUBCUTANEOUS
  Administered 2024-04-27 – 2024-04-28 (×3): 2 [IU] via SUBCUTANEOUS
  Administered 2024-04-28: 5 [IU] via SUBCUTANEOUS
  Administered 2024-04-28: 2 [IU] via SUBCUTANEOUS
  Administered 2024-04-29 (×3): 1 [IU] via SUBCUTANEOUS
  Administered 2024-04-29: 3 [IU] via SUBCUTANEOUS
  Filled 2024-04-26: qty 1
  Filled 2024-04-26: qty 2
  Filled 2024-04-26: qty 5
  Filled 2024-04-26: qty 1
  Filled 2024-04-26: qty 2
  Filled 2024-04-26: qty 1
  Filled 2024-04-26 (×2): qty 3
  Filled 2024-04-26: qty 2
  Filled 2024-04-26: qty 3
  Filled 2024-04-26: qty 1

## 2024-04-26 MED ORDER — IOHEXOL 350 MG/ML SOLN
100.0000 mL | Freq: Once | INTRAVENOUS | Status: AC | PRN
  Administered 2024-04-26: 100 mL via INTRAVENOUS

## 2024-04-26 MED ORDER — SODIUM CHLORIDE 0.9 % IV SOLN
2.0000 g | INTRAVENOUS | Status: DC
  Administered 2024-04-27 – 2024-04-29 (×3): 2 g via INTRAVENOUS
  Filled 2024-04-26 (×3): qty 20

## 2024-04-26 MED ORDER — SODIUM CHLORIDE 0.9 % IV BOLUS
500.0000 mL | Freq: Once | INTRAVENOUS | Status: AC
  Administered 2024-04-26: 500 mL via INTRAVENOUS

## 2024-04-26 MED ORDER — SODIUM CHLORIDE 0.9% IV SOLUTION: Freq: Once | INTRAVENOUS | Status: AC

## 2024-04-26 MED ORDER — PANTOPRAZOLE SODIUM 40 MG IV SOLR
40.0000 mg | Freq: Two times a day (BID) | INTRAVENOUS | Status: DC
  Administered 2024-04-27 – 2024-04-30 (×7): 40 mg via INTRAVENOUS
  Filled 2024-04-26 (×7): qty 10

## 2024-04-26 MED ORDER — SODIUM CHLORIDE 0.9 % IV SOLN
1.0000 g | Freq: Once | INTRAVENOUS | Status: AC
  Administered 2024-04-27: 1 g via INTRAVENOUS
  Filled 2024-04-26: qty 10

## 2024-04-26 MED ORDER — OCTREOTIDE LOAD VIA INFUSION
50.0000 ug | Freq: Once | INTRAVENOUS | Status: AC
  Administered 2024-04-26: 50 ug via INTRAVENOUS
  Filled 2024-04-26: qty 25

## 2024-04-26 MED ORDER — SODIUM CHLORIDE 0.9 % IV SOLN
50.0000 ug/h | INTRAVENOUS | Status: DC
  Administered 2024-04-26 – 2024-04-28 (×4): 50 ug/h via INTRAVENOUS
  Filled 2024-04-26 (×6): qty 1

## 2024-04-26 MED ORDER — SODIUM CHLORIDE 0.9 % IV SOLN
1.0000 g | Freq: Once | INTRAVENOUS | Status: AC
  Administered 2024-04-26: 1 g via INTRAVENOUS
  Filled 2024-04-26: qty 10

## 2024-04-26 NOTE — ED Triage Notes (Signed)
 Pt with a hx of cirrhosis presents with bloody emesis and diarrhea, ShOB, lightheadedness that started last night. He saw a product in [employer] labeled liver detox for the last 2 days. Pt reports he used to drink ETOH regularly, but quit on 12/9.

## 2024-04-26 NOTE — ED Provider Notes (Signed)
 " [place] EMERGENCY DEPARTMENT AT [facility] Provider Note   CSN: [id number] Arrival date & time: 04/26/24  1442     Patient presents with: Emesis   Craig Andrews is a 43 y.o. male with a history of alcoholic liver cirrhosis, SBP, esophageal varices, DM with insulin  noncompliance and DKA presents with complaints of bloody stools with hematemesis over the past few days.  Has associated abdominal pain, chest pain and shortness of breath.  Does report recent banding of his varices.  States that this is the first time he is throwing up blood since.  Has been taking some vitamins and believes that this is causing his symptoms.    Emesis  Past Medical History:  Diagnosis Date   Allergy    Cirrhosis (HCC)    Hypertension    Past Surgical History:  Procedure Laterality Date   ESOPHAGEAL BANDING  04/01/2021   Procedure: ESOPHAGEAL BANDING;  Surgeon: Eartha Angelia Sieving, MD;  [facility]: [facility];  Service: Gastroenterology;;   ESOPHAGEAL BANDING  04/13/2024   Procedure: ESOPHAGOSCOPY, WITH ESOPHAGEAL VARICES BAND LIGATION;  Surgeon: Cinderella [name], MD;  Location: [facility];  Service: Endoscopy;;   ESOPHAGOGASTRODUODENOSCOPY N/A 04/13/2024   Procedure: EGD (ESOPHAGOGASTRODUODENOSCOPY);  Surgeon: Cinderella [name], MD;  Location: [facility];  Service: Endoscopy;  Laterality: N/A;   ESOPHAGOGASTRODUODENOSCOPY (EGD) WITH PROPOFOL  N/A 04/01/2021   Procedure: ESOPHAGOGASTRODUODENOSCOPY (EGD) WITH PROPOFOL ;  Surgeon: Eartha Angelia Sieving, MD;  Location: [facility];  Service: Gastroenterology;  Laterality: N/A;   ESOPHAGOGASTRODUODENOSCOPY (EGD) WITH PROPOFOL  N/A 04/29/2021   Procedure: ESOPHAGOGASTRODUODENOSCOPY (EGD) WITH PROPOFOL ;  Surgeon: Eartha Angelia Sieving, MD;  [facility]: [facility];  Service: Gastroenterology;  Laterality: N/A;  8:05   ESOPHAGOGASTRODUODENOSCOPY (EGD) WITH PROPOFOL  N/A 10/24/2021   Procedure: ESOPHAGOGASTRODUODENOSCOPY (EGD)  WITH PROPOFOL ;  Surgeon: Eartha Angelia Sieving, MD;  Location: [facility];  Service: Gastroenterology;  Laterality: N/A;  1035   FRACTURE SURGERY     rod inside leg         Prior to Admission medications  Medication Sig Start Date End Date Taking? Authorizing Provider  Blood Glucose Monitoring Suppl DEVI 1 each by Does not apply route in the morning, at noon, and at bedtime. May substitute to any manufacturer covered by patient's insurance. 01/08/23   Suellen Cantor A, PA-C  carvedilol  (COREG ) 3.125 MG tablet Take 1 tablet (3.125 mg total) by mouth 2 (two) times daily with a meal. 04/14/24   Tat, Alm, MD  cefdinir  (OMNICEF ) 300 MG capsule Take 1 capsule (300 mg total) by mouth 2 (two) times daily. 04/14/24   Evonnie Alm, MD  cetirizine (ZYRTEC) 10 MG tablet Take 10 mg by mouth at bedtime.    [provider]  folic acid  (FOLVITE ) 1 MG tablet Take 1 tablet (1 mg total) by mouth daily. 04/15/24   Evonnie Alm, MD  lactulose  (CHRONULAC ) 10 GM/15ML solution TAKE 15 ML BY MOUTH  TWICE DAILY Patient taking differently: Take 15 mLs by mouth 2 (two) times daily as needed for mild constipation. 08/26/22   Carlan, Mitzie CROME, NP  metFORMIN  (GLUCOPHAGE ) 500 MG tablet Take 1 tablet (500 mg total) by mouth 2 (two) times daily with a meal. 04/14/24   Tat, Alm, MD  metroNIDAZOLE  (FLAGYL ) 500 MG tablet Take 1 tablet (500 mg total) by mouth every 12 (twelve) hours. 04/14/24   Evonnie Alm, MD  Multiple Vitamin (MULTIVITAMIN WITH MINERALS) TABS tablet Take 1 tablet by mouth daily.    [provider]  pantoprazole  (PROTONIX ) 40 MG tablet Take 1 tablet (40 mg total) by mouth 2 (two) times daily. 04/14/24   Evonnie Lenis, MD    Allergies: Patient has no known allergies.    Review of Systems  Gastrointestinal:  Positive for vomiting.    Updated Vital Signs BP 110/72   Pulse (!) 107   Temp 98 F (36.7 C) (Oral)   Resp (!) 23   Ht 5' 11 (1.803 m)   Wt 88.5 kg   SpO2 98%   BMI 27.20  kg/m   Physical Exam Vitals and nursing note reviewed.  Constitutional:      General: He is not in acute distress.    Appearance: He is well-developed.  HENT:     Head: Normocephalic and atraumatic.  Eyes:     Conjunctiva/sclera: Conjunctivae normal.  Cardiovascular:     Rate and Rhythm: Normal rate and regular rhythm.     Heart sounds: No murmur heard. Pulmonary:     Effort: Pulmonary effort is normal. No respiratory distress.     Breath sounds: Normal breath sounds.  Abdominal:     Palpations: Abdomen is soft.     Tenderness: There is no abdominal tenderness.     Comments: Soft, nondistended, nontender, no warmth  Musculoskeletal:        General: No swelling.     Cervical back: Neck supple.  Skin:    General: Skin is warm and dry.     Capillary Refill: Capillary refill takes less than 2 seconds.  Neurological:     Mental Status: He is alert.  Psychiatric:        Mood and Affect: Mood normal.     (all labs ordered are listed, but only abnormal results are displayed) Labs Reviewed  LIPASE, BLOOD - Abnormal; Notable for the following components:      Result Value   Lipase 75 (*)    All other components within normal limits  COMPREHENSIVE METABOLIC PANEL WITH GFR - Abnormal; Notable for the following components:   Sodium 127 (*)    Chloride 95 (*)    CO2 15 (*)    Glucose, Bld 364 (*)    Creatinine, Ser 0.58 (*)    Calcium 7.4 (*)    Albumin  2.7 (*)    AST 137 (*)    ALT 53 (*)    Total Bilirubin 5.6 (*)    Anion gap 17 (*)    All other components within normal limits  CBC - Abnormal; Notable for the following components:   WBC 22.1 (*)    RBC 1.90 (*)    Hemoglobin 7.1 (*)    HCT 20.7 (*)    MCV 108.9 (*)    MCH 37.4 (*)    All other components within normal limits  URINALYSIS, ROUTINE W REFLEX MICROSCOPIC - Abnormal; Notable for the following components:   Color, Urine AMBER (*)    Specific Gravity, Urine >1.046 (*)    Glucose, UA >=500 (*)    All  other components within normal limits  LACTIC ACID, PLASMA - Abnormal; Notable for the following components:   Lactic Acid, Venous 5.0 (*)    All other components within normal limits  LACTIC ACID, PLASMA - Abnormal; Notable for the following components:   Lactic Acid, Venous 4.6 (*)    All other components within normal limits  PROTIME-INR - Abnormal; Notable for the following components:   Prothrombin Time 16.2 (*)    All other components within normal  limits  BLOOD GAS, VENOUS - Abnormal; Notable for the following components:   pCO2, Ven 34 (*)    Bicarbonate 19.7 (*)    Acid-base deficit 4.8 (*)    All other components within normal limits  CBG MONITORING, ED - Abnormal; Notable for the following components:   Glucose-Capillary 393 (*)    All other components within normal limits  CULTURE, BLOOD (ROUTINE X 2)  CULTURE, BLOOD (ROUTINE X 2)  APTT  PRO BRAIN NATRIURETIC PEPTIDE  BETA-HYDROXYBUTYRIC ACID  CBC WITH DIFFERENTIAL/PLATELET  LACTIC ACID, PLASMA  POC OCCULT BLOOD, ED  TYPE AND SCREEN  PREPARE RBC (CROSSMATCH)  TROPONIN T, HIGH SENSITIVITY    EKG: None  Radiology: CT Angio Chest PE W and/or Wo Contrast Result Date: 04/26/2024 CLINICAL DATA:  Chest and abdominal pain. Concern for pulmonary embolism. Cirrhosis with bloody emesis. EXAM: CT ANGIOGRAPHY CHEST CT ABDOMEN AND PELVIS WITH CONTRAST TECHNIQUE: Multidetector CT imaging of the chest was performed using the standard protocol during bolus administration of intravenous contrast. Multiplanar CT image reconstructions and MIPs were obtained to evaluate the vascular anatomy. Multidetector CT imaging of the abdomen and pelvis was performed using the standard protocol during bolus administration of intravenous contrast. RADIATION DOSE REDUCTION: This exam was performed according to the departmental dose-optimization program which includes automated exposure control, adjustment of the mA and/or kV according to patient size  and/or use of iterative reconstruction technique. CONTRAST:  OMNIPAQUE  IOHEXOL  350 MG/ML SOLN COMPARISON:  Chest radiograph dated 04/26/2024 and abdominal CT dated 04/11/2024. FINDINGS: CTA CHEST FINDINGS Cardiovascular: There is no cardiomegaly or pericardial effusion. The thoracic aorta is unremarkable. The origins of the great vessels of the aortic arch are patent. No pulmonary artery embolus identified. Mediastinum/Nodes: No hilar or mediastinal adenopathy. The esophagus is grossly unremarkable. No mediastinal fluid collection. Lungs/Pleura: There is a 15 mm nodule in the superior segment of the right lower lobe. No focal consolidation, pleural effusion or pneumothorax. The central airways are patent. Musculoskeletal: No acute osseous pathology. Old appearing fracture of the anterior left 4th-7th ribs. Review of the MIP images confirms the above findings. CT ABDOMEN and PELVIS FINDINGS No intra-abdominal free air.  Small ascites. Hepatobiliary: Cirrhosis. No biliary dilatation. The gallbladder is unremarkable. Pancreas: Unremarkable. No pancreatic ductal dilatation or surrounding inflammatory changes. Spleen: Normal in size without focal abnormality. Adrenals/Urinary Tract: The adrenal glands, kidneys, visualized ureters, and urinary bladder unremarkable. Stomach/Bowel: There is no bowel obstruction or active inflammation. The appendix is normal. Vascular/Lymphatic: Mild aortoiliac atherosclerotic disease. The IVC is unremarkable. No portal gas. Para soft Jewel of versus and recanalization of the umbilical vein in keeping with portal hypertension. There is no adenopathy. Reproductive: The prostate and seminal vesicles are grossly unremarkable. Other: None Musculoskeletal: No acute or significant osseous findings. Review of the MIP images confirms the above findings. IMPRESSION: 1. No acute intrathoracic, abdominal, or pelvic pathology. No CT evidence of pulmonary artery embolus. 2. A 15 mm nodule in the  superior segment of the right lower lobe. Consider one of the following in 3 months for both low-risk and high-risk individuals: (a) repeat chest CT, (b) follow-up PET-CT, or (c) tissue sampling. This recommendation follows the consensus statement: Guidelines for Management of Incidental Pulmonary Nodules Detected on CT Images: From the Fleischner Society 2017; Radiology 2017; 284:228-243. 3. Cirrhosis with small ascites and evidence of portal hypertension. 4. No bowel obstruction. Normal appendix. 5.  Aortic Atherosclerosis (ICD10-I70.0). Electronically Signed   By: Vanetta Chou M.D.   On: 04/26/2024 18:10  CT ABDOMEN PELVIS W CONTRAST Result Date: 04/26/2024 CLINICAL DATA:  Chest and abdominal pain. Concern for pulmonary embolism. Cirrhosis with bloody emesis. EXAM: CT ANGIOGRAPHY CHEST CT ABDOMEN AND PELVIS WITH CONTRAST TECHNIQUE: Multidetector CT imaging of the chest was performed using the standard protocol during bolus administration of intravenous contrast. Multiplanar CT image reconstructions and MIPs were obtained to evaluate the vascular anatomy. Multidetector CT imaging of the abdomen and pelvis was performed using the standard protocol during bolus administration of intravenous contrast. RADIATION DOSE REDUCTION: This exam was performed according to the departmental dose-optimization program which includes automated exposure control, adjustment of the mA and/or kV according to patient size and/or use of iterative reconstruction technique. CONTRAST:  OMNIPAQUE  IOHEXOL  350 MG/ML SOLN COMPARISON:  Chest radiograph dated 04/26/2024 and abdominal CT dated 04/11/2024. FINDINGS: CTA CHEST FINDINGS Cardiovascular: There is no cardiomegaly or pericardial effusion. The thoracic aorta is unremarkable. The origins of the great vessels of the aortic arch are patent. No pulmonary artery embolus identified. Mediastinum/Nodes: No hilar or mediastinal adenopathy. The esophagus is grossly unremarkable. No  mediastinal fluid collection. Lungs/Pleura: There is a 15 mm nodule in the superior segment of the right lower lobe. No focal consolidation, pleural effusion or pneumothorax. The central airways are patent. Musculoskeletal: No acute osseous pathology. Old appearing fracture of the anterior left 4th-7th ribs. Review of the MIP images confirms the above findings. CT ABDOMEN and PELVIS FINDINGS No intra-abdominal free air.  Small ascites. Hepatobiliary: Cirrhosis. No biliary dilatation. The gallbladder is unremarkable. Pancreas: Unremarkable. No pancreatic ductal dilatation or surrounding inflammatory changes. Spleen: Normal in size without focal abnormality. Adrenals/Urinary Tract: The adrenal glands, kidneys, visualized ureters, and urinary bladder unremarkable. Stomach/Bowel: There is no bowel obstruction or active inflammation. The appendix is normal. Vascular/Lymphatic: Mild aortoiliac atherosclerotic disease. The IVC is unremarkable. No portal gas. Para soft Jewel of versus and recanalization of the umbilical vein in keeping with portal hypertension. There is no adenopathy. Reproductive: The prostate and seminal vesicles are grossly unremarkable. Other: None Musculoskeletal: No acute or significant osseous findings. Review of the MIP images confirms the above findings. IMPRESSION: 1. No acute intrathoracic, abdominal, or pelvic pathology. No CT evidence of pulmonary artery embolus. 2. A 15 mm nodule in the superior segment of the right lower lobe. Consider one of the following in 3 months for both low-risk and high-risk individuals: (a) repeat chest CT, (b) follow-up PET-CT, or (c) tissue sampling. This recommendation follows the consensus statement: Guidelines for Management of Incidental Pulmonary Nodules Detected on CT Images: From the Fleischner Society 2017; Radiology 2017; 284:228-243. 3. Cirrhosis with small ascites and evidence of portal hypertension. 4. No bowel obstruction. Normal appendix. 5.  Aortic  Atherosclerosis (ICD10-I70.0). Electronically Signed   By: Vanetta Chou M.D.   On: 04/26/2024 18:10   DG Chest Portable 1 View Result Date: 04/26/2024 CLINICAL DATA:  Bloody emesis, diarrhea and shortness of breath. EXAM: PORTABLE CHEST 1 VIEW COMPARISON:  None Available. FINDINGS: The heart size and mediastinal contours are within normal limits. A stable, likely benign, 2 mm nodular opacity is seen along the periphery of the right upper lobe. An ill-defined 14 mm nodular opacity is also seen overlying the mid right lung. This is not clearly identified on the prior study. No acute infiltrate, pleural effusion or pneumothorax is identified. The visualized skeletal structures are unremarkable. IMPRESSION: 1. No acute cardiopulmonary disease. 2. Ill-defined nodular opacity overlying the mid right lung. Correlation with nonemergent chest CT is recommended to exclude the presence of  an underlying pulmonary nodule. Electronically Signed   By: Suzen Dials M.D.   On: 04/26/2024 16:08     .Critical Care  Performed by: Donnajean Lynwood DEL, PA-C Authorized by: Donnajean Lynwood DEL, PA-C   Critical care provider statement:    Critical care time (minutes):  30   Critical care was necessary to treat or prevent imminent or life-threatening deterioration of the following conditions:  Circulatory failure, hepatic failure and sepsis   Critical care was time spent personally by me on the following activities:  Development of treatment plan with patient or surrogate, discussions with consultants, evaluation of patient's response to treatment, examination of patient, ordering and review of laboratory studies, ordering and review of radiographic studies, ordering and performing treatments and interventions, pulse oximetry, re-evaluation of patient's condition and review of old charts    Medications Ordered in the ED  octreotide  (SANDOSTATIN ) 2 mcg/mL load via infusion 50 mcg (50 mcg Intravenous Bolus from Bag  04/26/24 1630)    And  octreotide  (SANDOSTATIN ) 500 mcg in sodium chloride  0.9 % 250 mL (2 mcg/mL) infusion (50 mcg/hr Intravenous New Bag/Given 04/26/24 1629)  ondansetron  (ZOFRAN ) injection 4 mg (4 mg Intravenous Given 04/26/24 1529)  cefTRIAXone  (ROCEPHIN ) 1 g in sodium chloride  0.9 % 100 mL IVPB (0 g Intravenous Stopped 04/26/24 1624)  pantoprazole  (PROTONIX ) injection 80 mg (80 mg Intravenous Given 04/26/24 1615)  0.9 %  sodium chloride  infusion (Manually program via Guardrails IV Fluids) ( Intravenous New Bag/Given 04/26/24 1750)  sodium chloride  0.9 % bolus 500 mL (500 mLs Intravenous New Bag/Given 04/26/24 1624)  iohexol  (OMNIPAQUE ) 350 MG/ML injection 100 mL (100 mLs Intravenous Contrast Given 04/26/24 1723)    Clinical Course as of 04/26/24 2253  Wed Apr 26, 2024  1617 Patient with history cirrhosis with varices evaluated for hematemesis and melena over the past several days with associated abdominal pain, chest pain and shortness of breath.  Upon arrival patient is tachycardic otherwise hemodynamically stable.  She is pale in color otherwise nontoxic-appearing. [JT]  1618 CBC(!) Significant leukocytosis of 22.1, hemoglobin is down to 7 to 14, 2 weeks ago [JT]  1706 Comprehensive metabolic panel(!) Hyponatremia 127, anion gap 17 bicarb is 16, glucose of 364.  Will add on VBG obtained beta hydroxy [JT]  1718 Lipase, blood(!) Mildly elevated to 75, however appears to be downtrending from prior [JT]  1719 Troponin T, High Sensitivity Within normal limits   [JT]  1719 Pro Brain natriuretic peptide Without elevation [JT]  1736 Lactic acid, plasma(!!) Elevated to 5 [JT]  1814 No acute abnormality noted on chest abdomen pelvis.  There is a 15 mm nodule noted in the lungs and will require outpatient follow-up [JT]  2033 Hemoccult is positive [JT]  2033 Does have a history of DKA. Is not quite in DKA today but will need admission for SIRS with suspected variceal bleed. Dr. Cinderella with  GI notified of admission.   [JT]  2250 Dr. Cinderella, has recommended transfer to [facility].  Dr.Gessner with [facility] in agreement [JT]    Clinical Course User Index [JT] Donnajean Lynwood DEL, PA-C                                 Medical Decision Making Amount and/or Complexity of Data Reviewed Labs: ordered. Decision-making details documented in ED Course. Radiology: ordered.  Risk Prescription drug management. Decision regarding hospitalization.   This patient presents to the ED with chief  complaint(s) of bleeding.  The complaint involves an extensive differential diagnosis and also carries with it a high risk of complications and morbidity.   Pertinent past medical history as listed in HPI  The differential diagnosis includes  ACS, PE, pneumonia, SBP, UTI, variceal bleed Additional history obtained: Additional history obtained from family Records reviewed Care Everywhere/External Records  Disposition:   Patient be transferred to [facility] for further evaluation and management  Social Determinants of Health:   none  This note was dictated with voice recognition software.  Despite best efforts at proofreading, errors may have occurred which can change the documentation meaning.       Final diagnoses:  SIRS (systemic inflammatory response syndrome) [facility])    ED Discharge Orders     None          Donnajean Lynwood DEL, PA-C 04/26/24 2253    Elnor Savant A, DO 05/01/24 1542  "

## 2024-04-26 NOTE — H&P (Signed)
 " History and Physical    Craig Andrews DOB: 12/30/1980 DOA: 04/26/2024  PCP: Harl Jayson CROME, MD (Inactive)   Patient coming from: Home  I have personally briefly reviewed patient's old medical records in [facility]  Chief Complaint: Blood stools  HPI: Craig Andrews is a 43 y.o. male with medical history significant for alcoholic liver cirrhosis, SBP, hypertension with esophageal varices. Patient presented to the ED with complaints of multiple episodes of bloody stools, and 1 episode of bloody emesis today.  Patient reports he bought a medication at [facility] for liver and detox.  He started taking this 2 days ago, the first day he just had a lot of stools, yesterday he started having bloody stools.  He had lower abdominal pain for which he took one 400 mg dose of ibuprofen .  Denies any other NSAID use.  Recently hospitalized 12/9 to 12/12 for DKA, and GI bleed.  He was placed on insulin  drip.  Underwent EGD 12/11-grade 3 and large (> 5mm) esophageal varices.  Incompletely eradicated. Banded. Portal hypertensive gastropathy.  ED Course: Temperature 98.  Heart rate 92-125.  Respiratory rate 15-33.  Blood pressure systolic 103-127.  O2 sats greater than 96% on room air. Hemoglobin 7.1, baseline 13-14. Sodium 127. Anion gap 17, serum bicarb of 15.  Blood glucose - 364.  BHB - 0.17. Lactic acidosis 5 >> 4.6. VBG shows pH of 7.37, pCO2 of 34. IV ceftriaxone  1 g given, Sandostatin , Protonix  80 mg x 1 given. 500 bolus given, 2 unit PRBC ordered for transfusion. EDP talked to GI Dr. Cinderella, initially recommended admission here at [facility], with further chart review recommended admission to tertiary center - this could be post banding ulcer , too early to reband, and if rebanded, could have severe bleeding, and would require large amount of blood . So ideally this EGD should be done in a place where we have IR for TIPs and large amount of blood available .    Review of Systems: As per HPI all other systems reviewed and negative.  Past Medical History:  Diagnosis Date   Allergy    Cirrhosis (HCC)    Hypertension     Past Surgical History:  Procedure Laterality Date   ESOPHAGEAL BANDING  04/01/2021   Procedure: ESOPHAGEAL BANDING;  Surgeon: Eartha Angelia Sieving, MD;  [facility];  Service: Gastroenterology;;   ESOPHAGEAL BANDING  04/13/2024   Procedure: ESOPHAGOSCOPY, WITH ESOPHAGEAL VARICES BAND LIGATION;  Surgeon: Cinderella [name], MD;  Location: [facility];  Service: Endoscopy;;   ESOPHAGOGASTRODUODENOSCOPY N/A 04/13/2024   Procedure: EGD (ESOPHAGOGASTRODUODENOSCOPY);  Surgeon: Cinderella [name], MD;  Location: [facility];  Service: Endoscopy;  Laterality: N/A;   ESOPHAGOGASTRODUODENOSCOPY (EGD) WITH PROPOFOL  N/A 04/01/2021   Procedure: ESOPHAGOGASTRODUODENOSCOPY (EGD) WITH PROPOFOL ;  Surgeon: Eartha Angelia Sieving, MD;  [facility]: [facility];  Service: Gastroenterology;  Laterality: N/A;   ESOPHAGOGASTRODUODENOSCOPY (EGD) WITH PROPOFOL  N/A 04/29/2021   Procedure: ESOPHAGOGASTRODUODENOSCOPY (EGD) WITH PROPOFOL ;  Surgeon: Eartha Angelia Sieving, MD;  [facility];  Service: Gastroenterology;  Laterality: N/A;  8:05   ESOPHAGOGASTRODUODENOSCOPY (EGD) WITH PROPOFOL  N/A 10/24/2021   Procedure: ESOPHAGOGASTRODUODENOSCOPY (EGD) WITH PROPOFOL ;  Surgeon: Eartha Angelia Sieving, MD;  Location: [facility];  Service: Gastroenterology;  Laterality: N/A;  1035   FRACTURE SURGERY     rod inside leg       reports that he has been smoking cigarettes and cigars. He has been exposed to tobacco smoke. He has  never used smokeless tobacco. He reports that he does not currently use alcohol. He reports current drug use. Frequency: 2.00 times per week. Drug: Marijuana.  Allergies[1]  Family history of hypertension.  Prior to Admission medications  Medication Sig Start Date End Date Taking? Authorizing  Provider  Blood Glucose Monitoring Suppl DEVI 1 each by Does not apply route in the morning, at noon, and at bedtime. May substitute to any manufacturer covered by patient's insurance. 01/08/23   Suellen Cantor A, PA-C  carvedilol  (COREG ) 3.125 MG tablet Take 1 tablet (3.125 mg total) by mouth 2 (two) times daily with a meal. 04/14/24   Tat, Alm, MD  cefdinir  (OMNICEF ) 300 MG capsule Take 1 capsule (300 mg total) by mouth 2 (two) times daily. 04/14/24   Evonnie Alm, MD  cetirizine (ZYRTEC) 10 MG tablet Take 10 mg by mouth at bedtime.    [provider]  folic acid  (FOLVITE ) 1 MG tablet Take 1 tablet (1 mg total) by mouth daily. 04/15/24   Evonnie Alm, MD  lactulose  (CHRONULAC ) 10 GM/15ML solution TAKE 15 ML BY MOUTH  TWICE DAILY Patient taking differently: Take 15 mLs by mouth 2 (two) times daily as needed for mild constipation. 08/26/22   Carlan, Mitzie CROME, NP  metFORMIN  (GLUCOPHAGE ) 500 MG tablet Take 1 tablet (500 mg total) by mouth 2 (two) times daily with a meal. 04/14/24   Tat, Alm, MD  metroNIDAZOLE  (FLAGYL ) 500 MG tablet Take 1 tablet (500 mg total) by mouth every 12 (twelve) hours. 04/14/24   Evonnie Alm, MD  Multiple Vitamin (MULTIVITAMIN WITH MINERALS) TABS tablet Take 1 tablet by mouth daily.    [provider]  pantoprazole  (PROTONIX ) 40 MG tablet Take 1 tablet (40 mg total) by mouth 2 (two) times daily. 04/14/24   Evonnie Alm, MD    Physical Exam: Vitals:   04/26/24 1830 04/26/24 1900 04/26/24 2000 04/26/24 2015  BP: 104/74 107/69 117/76 (!) 104/90  Pulse: (!) 115 (!) 114 (!) 110 92  Resp: (!) 26 (!) 21 15 (!) 22  Temp:      TempSrc:      SpO2: 96% 95% 96% 97%  Weight:      Height:        Constitutional: NAD, calm, comfortable Vitals:   04/26/24 1830 04/26/24 1900 04/26/24 2000 04/26/24 2015  BP: 104/74 107/69 117/76 (!) 104/90  Pulse: (!) 115 (!) 114 (!) 110 92  Resp: (!) 26 (!) 21 15 (!) 22  Temp:      TempSrc:      SpO2: 96% 95% 96% 97%  Weight:       Height:       Eyes: PERRL, mildly icteric ENMT: Mucous membranes are moist.   Neck: normal, supple, no masses, no thyromegaly Respiratory: clear to auscultation bilaterally, no wheezing, no crackles. Normal respiratory effort. No accessory muscle use.  Cardiovascular: Regular rate and rhythm, no murmurs / rubs / gallops. No extremity edema.  Extremities warm. Abdomen: no tenderness, no masses palpated. No hepatosplenomegaly. Bowel sounds positive.  Musculoskeletal: no clubbing / cyanosis. No joint deformity upper and lower extremities.  Skin: no rashes, lesions, ulcers. No induration Neurologic: No facial asymmetry, moves extremities spontaneously, speech fluent. Psychiatric: Normal judgment and insight. Alert and oriented x 3. Normal mood.   Labs on Admission: I have personally reviewed following labs and imaging studies  CBC: Recent Labs  Lab 04/26/24 1442  WBC 22.1*  HGB 7.1*  HCT 20.7*  MCV 108.9*  PLT 269  Basic Metabolic Panel: Recent Labs  Lab 04/26/24 1442  NA 127*  K 4.2  CL 95*  CO2 15*  GLUCOSE 364*  BUN 8  CREATININE 0.58*  CALCIUM 7.4*   GFR: Estimated Creatinine Clearance: 128.1 mL/min (A) (by C-G formula based on SCr of 0.58 mg/dL (L)). Liver Function Tests: Recent Labs  Lab 04/26/24 1442  AST 137*  ALT 53*  ALKPHOS 99  BILITOT 5.6*  PROT 6.7  ALBUMIN  2.7*   Recent Labs  Lab 04/26/24 1442  LIPASE 75*   No results for input(s): AMMONIA in the last 168 hours. Coagulation Profile: Recent Labs  Lab 04/26/24 1606  INR 1.2   BNP (last 3 results) Recent Labs    04/26/24 1442  PROBNP <50.0   HbA1C: No results for input(s): HGBA1C in the last 72 hours. CBG: Recent Labs  Lab 04/26/24 1458  GLUCAP 393*   Urine analysis:    Component Value Date/Time   COLORURINE AMBER (A) 04/26/2024 1507   APPEARANCEUR CLEAR 04/26/2024 1507   LABSPEC >1.046 (H) 04/26/2024 1507   PHURINE 5.0 04/26/2024 1507   GLUCOSEU >=500 (A) 04/26/2024  1507   HGBUR NEGATIVE 04/26/2024 1507   BILIRUBINUR NEGATIVE 04/26/2024 1507   KETONESUR NEGATIVE 04/26/2024 1507   PROTEINUR NEGATIVE 04/26/2024 1507   NITRITE NEGATIVE 04/26/2024 1507   LEUKOCYTESUR NEGATIVE 04/26/2024 1507    Radiological Exams on Admission: CT Angio Chest PE W and/or Wo Contrast Result Date: 04/26/2024 CLINICAL DATA:  Chest and abdominal pain. Concern for pulmonary embolism. Cirrhosis with bloody emesis. EXAM: CT ANGIOGRAPHY CHEST CT ABDOMEN AND PELVIS WITH CONTRAST TECHNIQUE: Multidetector CT imaging of the chest was performed using the standard protocol during bolus administration of intravenous contrast. Multiplanar CT image reconstructions and MIPs were obtained to evaluate the vascular anatomy. Multidetector CT imaging of the abdomen and pelvis was performed using the standard protocol during bolus administration of intravenous contrast. RADIATION DOSE REDUCTION: This exam was performed according to the departmental dose-optimization program which includes automated exposure control, adjustment of the mA and/or kV according to patient size and/or use of iterative reconstruction technique. CONTRAST:  OMNIPAQUE  IOHEXOL  350 MG/ML SOLN COMPARISON:  Chest radiograph dated 04/26/2024 and abdominal CT dated 04/11/2024. FINDINGS: CTA CHEST FINDINGS Cardiovascular: There is no cardiomegaly or pericardial effusion. The thoracic aorta is unremarkable. The origins of the great vessels of the aortic arch are patent. No pulmonary artery embolus identified. Mediastinum/Nodes: No hilar or mediastinal adenopathy. The esophagus is grossly unremarkable. No mediastinal fluid collection. Lungs/Pleura: There is a 15 mm nodule in the superior segment of the right lower lobe. No focal consolidation, pleural effusion or pneumothorax. The central airways are patent. Musculoskeletal: No acute osseous pathology. Old appearing fracture of the anterior left 4th-7th ribs. Review of the MIP images  confirms the above findings. CT ABDOMEN and PELVIS FINDINGS No intra-abdominal free air.  Small ascites. Hepatobiliary: Cirrhosis. No biliary dilatation. The gallbladder is unremarkable. Pancreas: Unremarkable. No pancreatic ductal dilatation or surrounding inflammatory changes. Spleen: Normal in size without focal abnormality. Adrenals/Urinary Tract: The adrenal glands, kidneys, visualized ureters, and urinary bladder unremarkable. Stomach/Bowel: There is no bowel obstruction or active inflammation. The appendix is normal. Vascular/Lymphatic: Mild aortoiliac atherosclerotic disease. The IVC is unremarkable. No portal gas. Para soft Jewel of versus and recanalization of the umbilical vein in keeping with portal hypertension. There is no adenopathy. Reproductive: The prostate and seminal vesicles are grossly unremarkable. Other: None Musculoskeletal: No acute or significant osseous findings. Review of the MIP images confirms  the above findings. IMPRESSION: 1. No acute intrathoracic, abdominal, or pelvic pathology. No CT evidence of pulmonary artery embolus. 2. A 15 mm nodule in the superior segment of the right lower lobe. Consider one of the following in 3 months for both low-risk and high-risk individuals: (a) repeat chest CT, (b) follow-up PET-CT, or (c) tissue sampling. This recommendation follows the consensus statement: Guidelines for Management of Incidental Pulmonary Nodules Detected on CT Images: From the Fleischner Society 2017; Radiology 2017; 284:228-243. 3. Cirrhosis with small ascites and evidence of portal hypertension. 4. No bowel obstruction. Normal appendix. 5.  Aortic Atherosclerosis (ICD10-I70.0). Electronically Signed   By: Vanetta Chou M.D.   On: 04/26/2024 18:10   CT ABDOMEN PELVIS W CONTRAST Result Date: 04/26/2024 CLINICAL DATA:  Chest and abdominal pain. Concern for pulmonary embolism. Cirrhosis with bloody emesis. EXAM: CT ANGIOGRAPHY CHEST CT ABDOMEN AND PELVIS WITH CONTRAST  TECHNIQUE: Multidetector CT imaging of the chest was performed using the standard protocol during bolus administration of intravenous contrast. Multiplanar CT image reconstructions and MIPs were obtained to evaluate the vascular anatomy. Multidetector CT imaging of the abdomen and pelvis was performed using the standard protocol during bolus administration of intravenous contrast. RADIATION DOSE REDUCTION: This exam was performed according to the departmental dose-optimization program which includes automated exposure control, adjustment of the mA and/or kV according to patient size and/or use of iterative reconstruction technique. CONTRAST:  OMNIPAQUE  IOHEXOL  350 MG/ML SOLN COMPARISON:  Chest radiograph dated 04/26/2024 and abdominal CT dated 04/11/2024. FINDINGS: CTA CHEST FINDINGS Cardiovascular: There is no cardiomegaly or pericardial effusion. The thoracic aorta is unremarkable. The origins of the great vessels of the aortic arch are patent. No pulmonary artery embolus identified. Mediastinum/Nodes: No hilar or mediastinal adenopathy. The esophagus is grossly unremarkable. No mediastinal fluid collection. Lungs/Pleura: There is a 15 mm nodule in the superior segment of the right lower lobe. No focal consolidation, pleural effusion or pneumothorax. The central airways are patent. Musculoskeletal: No acute osseous pathology. Old appearing fracture of the anterior left 4th-7th ribs. Review of the MIP images confirms the above findings. CT ABDOMEN and PELVIS FINDINGS No intra-abdominal free air.  Small ascites. Hepatobiliary: Cirrhosis. No biliary dilatation. The gallbladder is unremarkable. Pancreas: Unremarkable. No pancreatic ductal dilatation or surrounding inflammatory changes. Spleen: Normal in size without focal abnormality. Adrenals/Urinary Tract: The adrenal glands, kidneys, visualized ureters, and urinary bladder unremarkable. Stomach/Bowel: There is no bowel obstruction or active inflammation. The  appendix is normal. Vascular/Lymphatic: Mild aortoiliac atherosclerotic disease. The IVC is unremarkable. No portal gas. Para soft Jewel of versus and recanalization of the umbilical vein in keeping with portal hypertension. There is no adenopathy. Reproductive: The prostate and seminal vesicles are grossly unremarkable. Other: None Musculoskeletal: No acute or significant osseous findings. Review of the MIP images confirms the above findings. IMPRESSION: 1. No acute intrathoracic, abdominal, or pelvic pathology. No CT evidence of pulmonary artery embolus. 2. A 15 mm nodule in the superior segment of the right lower lobe. Consider one of the following in 3 months for both low-risk and high-risk individuals: (a) repeat chest CT, (b) follow-up PET-CT, or (c) tissue sampling. This recommendation follows the consensus statement: Guidelines for Management of Incidental Pulmonary Nodules Detected on CT Images: From the Fleischner Society 2017; Radiology 2017; 284:228-243. 3. Cirrhosis with small ascites and evidence of portal hypertension. 4. No bowel obstruction. Normal appendix. 5.  Aortic Atherosclerosis (ICD10-I70.0). Electronically Signed   By: Vanetta Chou M.D.   On: 04/26/2024 18:10   DG  Chest Portable 1 View Result Date: 04/26/2024 CLINICAL DATA:  Bloody emesis, diarrhea and shortness of breath. EXAM: PORTABLE CHEST 1 VIEW COMPARISON:  None Available. FINDINGS: The heart size and mediastinal contours are within normal limits. A stable, likely benign, 2 mm nodular opacity is seen along the periphery of the right upper lobe. An ill-defined 14 mm nodular opacity is also seen overlying the mid right lung. This is not clearly identified on the prior study. No acute infiltrate, pleural effusion or pneumothorax is identified. The visualized skeletal structures are unremarkable. IMPRESSION: 1. No acute cardiopulmonary disease. 2. Ill-defined nodular opacity overlying the mid right lung. Correlation with  nonemergent chest CT is recommended to exclude the presence of an underlying pulmonary nodule. Electronically Signed   By: Suzen Dials M.D.   On: 04/26/2024 16:08   EKG: Independently reviewed.  Sinus tachycardia rate 115.  QTc 410.  No significant change from prior.  Assessment/Plan Principal Problem:   GI bleed Active Problems:   Alcoholic cirrhosis (HCC)   Secondary esophageal varices with bleeding (HCC)   Portal hypertensive gastropathy (HCC)   Hyponatremia   Assessment and Plan:  GI bleed with esophageal varices- reports multiple episodes of bloody stools and 1 bloody emesis.  Hemoglobin 7.1 from baseline 13-14.  CT abdomen and pelvis with contrast -no acute abnormality, cirrhosis with small ascites and evidence of portal hypertension. -  Recent EGD 12/11-grade 3 large >59mm esophageal varices, incompletely eradicated.  Banded by Dr. Cinderella 12/11. - Per Dr. Cinderella - this could be post banding ulcer , too early to reband, and if rebanded, could have severe bleeding, and would require large amount of blood . So ideally this EGD should be done in a place where we have IR for TIPs. - I reached out to Dr. Avram via secure chat, he graciously accepted in transfer - 2 units PRBC ordered for transfusion in ED + 500cc bolus - Continue N/s 75cc/hr x 15hrs - Trend H&H - Continue IV ceftriaxone  2 g daily - Continue octreotide  - IV Protonix  80 mg x 1 given, continue 40 twice daily - Hold Carvedilol  3.125 mg twice daily held in the setting of GI bleed - Remain n.p.o.  Lactic acidosis-5 >> 4.6.  In the setting of GI bleed, diarrhea, and liver disease. - bolus given, 2 units PRBCs ordered - Trend lactic acid  Hyponatremia-sodium 127. - Trend  SIRS, leukocytosis-22.1.  With lactic acidosis and tachycardia-92-125 and tachypnea-15-33.  Reports prior abdominal pain, since resolved.  History of SBP.   - CT abdomen pelvis with contrast- shows small ascites. CTA chest with contrast and  UA- no infectious etiology identified at this time. - Continue IV ceftriaxone  2 g daily for now for SBP prophylaxis - Follow-up blood cultures  Alcoholic liver cirrhosis, portal hypertension, elevated liver enzymes- reports last alcoholic beverage was 12/9 when he was hospitalized.  No alcoholic beverage since then.  On lactulose  as needed.  AST 127.  ALT 53.  ALP 99, T. bili 5.6. - Abstinence praised and encouraged - Thiamine  folate multivitamins  Anion gap metabolic acidosis-anion gap of 17, serum bicarb of 15, blood glucose 364, with BHB - normal 0.17. Likely secondary to lactic acidosis. - trend  Diabetes mellitus with hyperglycemia - Hold metformin  - SSI- Q4h   Pulmonary nodule- A 15 mm nodule in the superior segment of the right lower lobe. Consider one of the following in 3 months for both low-risk and high-risk individuals: (a) repeat chest CT, (b) follow-up PET-CT, or (  c) tissue sampling. - Follow-up as outpatient   DVT prophylaxis: SCDS Code Status: FULL Family Communication: Significant other at bedside Disposition Plan: > 2 days Consults called:  GI- [facility]. Admission status:  I certify that at the point of admission it is my clinical judgment that the patient will require inpatient hospital care spanning beyond 2 midnights from the point of admission due to high intensity of service, high risk for further deterioration and high frequency of surveillance required.   CRITICAL CARE Performed by: Tully FORBES Carwin   Total critical care time: 60 minutes  Critical care time was exclusive of separately billable procedures and treating other patients.  Critical care was necessary to treat or prevent imminent or life-threatening deterioration.  Critical care was time spent personally by me on the following activities: development of treatment plan with patient and/or surrogate as well as nursing, discussions with consultants, evaluation of patient's response to  treatment, examination of patient, obtaining history from patient or surrogate, ordering and performing treatments and interventions, ordering and review of laboratory studies, ordering and review of radiographic studies, pulse oximetry and re-evaluation of patient's condition.   Author: Tully FORBES Carwin, MD 04/26/2024 11:14 PM  For on call review [email or web address].     [1] No Known Allergies  "

## 2024-04-27 DIAGNOSIS — I8501 Esophageal varices with bleeding: Secondary | ICD-10-CM

## 2024-04-27 DIAGNOSIS — R197 Diarrhea, unspecified: Secondary | ICD-10-CM

## 2024-04-27 DIAGNOSIS — K921 Melena: Secondary | ICD-10-CM

## 2024-04-27 DIAGNOSIS — K920 Hematemesis: Secondary | ICD-10-CM

## 2024-04-27 DIAGNOSIS — K2961 Other gastritis with bleeding: Secondary | ICD-10-CM

## 2024-04-27 DIAGNOSIS — D62 Acute posthemorrhagic anemia: Secondary | ICD-10-CM

## 2024-04-27 LAB — BASIC METABOLIC PANEL WITH GFR
Anion gap: 8 (ref 5–15)
BUN: 6 mg/dL (ref 6–20)
CO2: 21 mmol/L — ABNORMAL LOW (ref 22–32)
Calcium: 6.8 mg/dL — ABNORMAL LOW (ref 8.9–10.3)
Chloride: 101 mmol/L (ref 98–111)
Creatinine, Ser: 0.65 mg/dL (ref 0.61–1.24)
GFR, Estimated: >60 mL/min
Glucose, Bld: 227 mg/dL — ABNORMAL HIGH (ref 70–99)
Potassium: 3.8 mmol/L (ref 3.5–5.1)
Sodium: 129 mmol/L — ABNORMAL LOW (ref 135–145)

## 2024-04-27 LAB — CBC WITH DIFFERENTIAL/PLATELET
Abs Immature Granulocytes: 0.22 K/uL — ABNORMAL HIGH (ref 0.00–0.07)
Basophils Absolute: 0.1 K/uL (ref 0.0–0.1)
Basophils Relative: 1 %
Eosinophils Absolute: 0.4 K/uL (ref 0.0–0.5)
Eosinophils Relative: 2 %
HCT: 22.8 % — ABNORMAL LOW (ref 39.0–52.0)
Hemoglobin: 7.9 g/dL — ABNORMAL LOW (ref 13.0–17.0)
Immature Granulocytes: 1 %
Lymphocytes Relative: 15 %
Lymphs Abs: 2.7 K/uL (ref 0.7–4.0)
MCH: 35.1 pg — ABNORMAL HIGH (ref 26.0–34.0)
MCHC: 34.6 g/dL (ref 30.0–36.0)
MCV: 101.3 fL — ABNORMAL HIGH (ref 80.0–100.0)
Monocytes Absolute: 2.3 K/uL — ABNORMAL HIGH (ref 0.1–1.0)
Monocytes Relative: 13 %
Neutro Abs: 11.9 K/uL — ABNORMAL HIGH (ref 1.7–7.7)
Neutrophils Relative %: 68 %
Platelets: 190 K/uL (ref 150–400)
RBC: 2.25 MIL/uL — ABNORMAL LOW (ref 4.22–5.81)
RDW: 17.7 % — ABNORMAL HIGH (ref 11.5–15.5)
WBC: 17.6 K/uL — ABNORMAL HIGH (ref 4.0–10.5)
nRBC: 0.1 % (ref 0.0–0.2)

## 2024-04-27 LAB — TYPE AND SCREEN
ABO/RH(D): O POS
Antibody Screen: NEGATIVE
Unit division: 0
Unit division: 0

## 2024-04-27 LAB — GLUCOSE, CAPILLARY
Glucose-Capillary: 183 mg/dL — ABNORMAL HIGH (ref 70–99)
Glucose-Capillary: 115 mg/dL — ABNORMAL HIGH (ref 70–99)
Glucose-Capillary: 147 mg/dL — ABNORMAL HIGH (ref 70–99)
Glucose-Capillary: 199 mg/dL — ABNORMAL HIGH (ref 70–99)
Glucose-Capillary: 249 mg/dL — ABNORMAL HIGH (ref 70–99)

## 2024-04-27 LAB — CBC
HCT: 23.5 % — ABNORMAL LOW (ref 39.0–52.0)
HCT: 20.6 % — ABNORMAL LOW (ref 39.0–52.0)
HCT: 21.7 % — ABNORMAL LOW (ref 39.0–52.0)
Hemoglobin: 8.2 g/dL — ABNORMAL LOW (ref 13.0–17.0)
Hemoglobin: 7.2 g/dL — ABNORMAL LOW (ref 13.0–17.0)
Hemoglobin: 7.4 g/dL — ABNORMAL LOW (ref 13.0–17.0)
MCH: 34.6 pg — ABNORMAL HIGH (ref 26.0–34.0)
MCH: 35.3 pg — ABNORMAL HIGH (ref 26.0–34.0)
MCH: 34.9 pg — ABNORMAL HIGH (ref 26.0–34.0)
MCHC: 34.9 g/dL (ref 30.0–36.0)
MCHC: 35 g/dL (ref 30.0–36.0)
MCHC: 34.1 g/dL (ref 30.0–36.0)
MCV: 99.2 fL (ref 80.0–100.0)
MCV: 101 fL — ABNORMAL HIGH (ref 80.0–100.0)
MCV: 102.4 fL — ABNORMAL HIGH (ref 80.0–100.0)
Platelets: 193 K/uL (ref 150–400)
Platelets: 148 K/uL — ABNORMAL LOW (ref 150–400)
Platelets: 157 K/uL (ref 150–400)
RBC: 2.37 MIL/uL — ABNORMAL LOW (ref 4.22–5.81)
RBC: 2.04 MIL/uL — ABNORMAL LOW (ref 4.22–5.81)
RBC: 2.12 MIL/uL — ABNORMAL LOW (ref 4.22–5.81)
RDW: 17.9 % — ABNORMAL HIGH (ref 11.5–15.5)
RDW: 18 % — ABNORMAL HIGH (ref 11.5–15.5)
RDW: 18 % — ABNORMAL HIGH (ref 11.5–15.5)
WBC: 16.7 K/uL — ABNORMAL HIGH (ref 4.0–10.5)
WBC: 13.6 K/uL — ABNORMAL HIGH (ref 4.0–10.5)
WBC: 12.4 K/uL — ABNORMAL HIGH (ref 4.0–10.5)
nRBC: 0.2 % (ref 0.0–0.2)
nRBC: 0.4 % — ABNORMAL HIGH (ref 0.0–0.2)
nRBC: 0.3 % — ABNORMAL HIGH (ref 0.0–0.2)

## 2024-04-27 LAB — BPAM RBC
Blood Product Expiration Date: 202601232359
Blood Product Expiration Date: 202601252359
ISSUE DATE / TIME: 202512241737
ISSUE DATE / TIME: 202512242204
Unit Type and Rh: 5100
Unit Type and Rh: 5100

## 2024-04-27 LAB — CBG MONITORING, ED: Glucose-Capillary: 294 mg/dL — ABNORMAL HIGH (ref 70–99)

## 2024-04-27 LAB — LACTIC ACID, PLASMA: Lactic Acid, Venous: 2.8 mmol/L (ref 0.5–1.9)

## 2024-04-27 MED ORDER — OCTREOTIDE ACETATE 500 MCG/ML IJ SOLN
INTRAMUSCULAR | Status: AC
  Filled 2024-04-27: qty 1

## 2024-04-27 MED ORDER — ADULT MULTIVITAMIN W/MINERALS CH
1.0000 | ORAL_TABLET | Freq: Every day | ORAL | Status: DC
  Administered 2024-04-27 – 2024-04-30 (×3): 1 via ORAL
  Filled 2024-04-27 (×4): qty 1

## 2024-04-27 MED ORDER — ACETAMINOPHEN 325 MG PO TABS: 650.0000 mg | ORAL_TABLET | Freq: Four times a day (QID) | ORAL | Status: DC | PRN

## 2024-04-27 MED ORDER — THIAMINE MONONITRATE 100 MG PO TABS
100.0000 mg | ORAL_TABLET | Freq: Every day | ORAL | Status: DC
  Administered 2024-04-29 – 2024-04-30 (×2): 100 mg via ORAL
  Filled 2024-04-27 (×3): qty 1

## 2024-04-27 MED ORDER — ONDANSETRON HCL 4 MG PO TABS: 4.0000 mg | ORAL_TABLET | Freq: Four times a day (QID) | ORAL | Status: DC | PRN

## 2024-04-27 MED ORDER — FOLIC ACID 1 MG PO TABS
1.0000 mg | ORAL_TABLET | Freq: Every day | ORAL | Status: DC
  Administered 2024-04-27 – 2024-04-30 (×3): 1 mg via ORAL
  Filled 2024-04-27 (×3): qty 1

## 2024-04-27 MED ORDER — POLYETHYLENE GLYCOL 3350 17 G PO PACK: 17.0000 g | PACK | Freq: Every day | ORAL | Status: DC | PRN

## 2024-04-27 MED ORDER — LACTULOSE 10 GM/15ML PO SOLN: 10.0000 g | Freq: Two times a day (BID) | ORAL | Status: DC | PRN

## 2024-04-27 MED ORDER — THIAMINE HCL 100 MG/ML IJ SOLN
100.0000 mg | Freq: Every day | INTRAMUSCULAR | Status: DC
  Administered 2024-04-27 – 2024-04-28 (×2): 100 mg via INTRAVENOUS
  Filled 2024-04-27 (×2): qty 2

## 2024-04-27 MED ORDER — SODIUM CHLORIDE 0.9 % IV SOLN: INTRAVENOUS | Status: AC

## 2024-04-27 MED ORDER — SUCRALFATE 1 GM/10ML PO SUSP
1.0000 g | Freq: Four times a day (QID) | ORAL | Status: DC
  Administered 2024-04-27 – 2024-04-30 (×11): 1 g via ORAL
  Filled 2024-04-27 (×11): qty 10

## 2024-04-27 MED ORDER — ACETAMINOPHEN 650 MG RE SUPP: 650.0000 mg | Freq: Four times a day (QID) | RECTAL | Status: DC | PRN

## 2024-04-27 MED ORDER — ONDANSETRON HCL 4 MG/2ML IJ SOLN: 4.0000 mg | Freq: Four times a day (QID) | INTRAMUSCULAR | Status: DC | PRN

## 2024-04-27 NOTE — Progress Notes (Signed)
 Patient has not had any emesis or BM this shift

## 2024-04-27 NOTE — Plan of Care (Signed)
 Patient is progressing towards goals of care.     Problem: Education: Goal: Knowledge of General Education information will improve Description: Including pain rating scale, medication(s)/side effects and non-pharmacologic comfort measures Outcome: Progressing   Problem: Health Behavior/Discharge Planning: Goal: Ability to manage health-related needs will improve Outcome: Progressing   Problem: Clinical Measurements: Goal: Ability to maintain clinical measurements within normal limits will improve Outcome: Progressing Goal: Will remain free from infection Outcome: Progressing Goal: Diagnostic test results will improve Outcome: Progressing Goal: Respiratory complications will improve Outcome: Progressing Goal: Cardiovascular complication will be avoided Outcome: Progressing   Problem: Activity: Goal: Risk for activity intolerance will decrease Outcome: Progressing   Problem: Nutrition: Goal: Adequate nutrition will be maintained Outcome: Progressing   Problem: Coping: Goal: Level of anxiety will decrease Outcome: Progressing   Problem: Elimination: Goal: Will not experience complications related to bowel motility Outcome: Progressing Goal: Will not experience complications related to urinary retention Outcome: Progressing   Problem: Pain Managment: Goal: General experience of comfort will improve and/or be controlled Outcome: Progressing   Problem: Safety: Goal: Ability to remain free from injury will improve Outcome: Progressing   Problem: Skin Integrity: Goal: Risk for impaired skin integrity will decrease Outcome: Progressing   Problem: Education: Goal: Ability to describe self-care measures that may prevent or decrease complications (Diabetes Survival Skills Education) will improve Outcome: Progressing Goal: Individualized Educational Video(s) Outcome: Progressing   Problem: Coping: Goal: Ability to adjust to condition or change in health will  improve Outcome: Progressing   Problem: Fluid Volume: Goal: Ability to maintain a balanced intake and output will improve Outcome: Progressing   Problem: Health Behavior/Discharge Planning: Goal: Ability to identify and utilize available resources and services will improve Outcome: Progressing Goal: Ability to manage health-related needs will improve Outcome: Progressing   Problem: Metabolic: Goal: Ability to maintain appropriate glucose levels will improve Outcome: Progressing   Problem: Nutritional: Goal: Maintenance of adequate nutrition will improve Outcome: Progressing Goal: Progress toward achieving an optimal weight will improve Outcome: Progressing   Problem: Skin Integrity: Goal: Risk for impaired skin integrity will decrease Outcome: Progressing   Problem: Tissue Perfusion: Goal: Adequacy of tissue perfusion will improve Outcome: Progressing

## 2024-04-27 NOTE — Progress Notes (Signed)
 " PROGRESS NOTE Craig Andrews    DOB: October 22, 1980, 43 y.o.  FMW:[id number]    Code Status: Full Code   DOA: 04/26/2024   LOS: 1  Brief hospital course  Craig Andrews is a 43 y.o. male with a PMH significant for alcoholic liver cirrhosis, SBP, hypertension with esophageal varices. Recently hospitalized 12/9 to 12/12 for DKA, and GI bleed.  He was placed on insulin  drip.  Underwent EGD 12/11-grade 3 and large (> 5mm) esophageal varices.  Incompletely eradicated. Banded. Portal hypertensive gastropathy. Patient presented to the ED with complaints of multiple episodes of bloody stools, and 1 episode of bloody emesis.  He took a liver detox medicine from [employer] and ibuprofen  prior to these episodes.    ED Course: Temperature 98.  Heart rate 92-125.  Respiratory rate 15-33.  Blood pressure systolic 103-127.  O2 sats greater than 96% on room air. Hemoglobin 7.1, baseline 13-14.  IV ceftriaxone  1 g given, Sandostatin , Protonix  80 mg x 1 given. 500 bolus given, 2 unit PRBC ordered for transfusion.  04/27/2024 -stable. GI following  Assessment & Plan  Principal Problem:   GI bleed Active Problems:   Alcoholic cirrhosis (HCC)   Secondary esophageal varices with bleeding (HCC)   Portal hypertensive gastropathy (HCC)   Hyponatremia  Acute GI bleed with hematemesis and hematochezia.  S/p esophageal variceal banding esophageal varices 12/11-  He took OTC NSAID and liver detox med that may have contributed to re-bleed.  Hgb as low as 7.1 on presentation. Received 2 units pRBC Hgb is 8.2 this am. Transfusion threshold is <7.  No overt bleeding this morning. CT abdomen and pelvis with contrast -no acute abnormality, cirrhosis with small ascites and evidence of portal hypertension. - GI following,    - CLD diet today and likely EGD in the morning - trend H&H - continue PPI IV and octreotide  - ppx antibiotics for SBP  - continue sucralfate     Lactic acidosis-5 >> 2.8.  In the setting of GI  bleed, diarrhea, and liver disease. - bolus given, 2 units PRBCs given - Trend lactic acid   Hyponatremia-sodium 127. - Trend   Alcoholic liver cirrhosis, portal hypertension, elevated liver enzymes- reports last alcoholic beverage was 12/9 when he was hospitalized. On lactulose  as needed for goal of 3 BM per day.  AST 127.  ALT 53.  ALP 99, T. bili 5.6. - Thiamine  folate multivitamins - if BP can tolerate, titrate on spironolactone , BB, and rifaxamine  - trend INR, liver labs   Diabetes mellitus with hyperglycemia - Hold metformin  - SSI- with meals    Pulmonary nodule- A 15 mm nodule in the superior segment of the right lower lobe. Consider one of the following in 3 months for both low-risk and high-risk individuals: (a) repeat chest CT, (b) follow-up PET-CT, or (c) tissue sampling. - Follow-up as outpatient  Body mass index is 27.12 kg/m.  VTE ppx: SCDs Start: 04/27/24 9661  Diet:     Diet   Diet NPO time specified   Consultants: GI  Subjective 04/27/2024    Pt reports feeling better today. Has not had more bleeding since arrive to the floor.    Objective  Blood pressure 118/75, pulse 96, temperature 97.8 F (36.6 C), temperature source Oral, resp. rate 20, height 5' 11 (1.803 m), weight 88.2 kg, SpO2 98%.  Intake/Output Summary (Last 24 hours) at 04/27/2024 0733 Last data filed at 04/27/2024 0131 Gross per 24 hour  Intake 1377.32 ml  Output --  Net  1377.32 ml   Filed Weights   04/26/24 1503 04/27/24 0306  Weight: 88.5 kg 88.2 kg     Physical Exam:  General: awake, alert, NAD HEENT: atraumatic, clear conjunctiva, anicteric sclera, MMM, hearing grossly normal Respiratory: normal respiratory effort. Cardiovascular: extremities well perfused, quick capillary refill, normal S1/S2, RRR, no JVD, murmurs Gastrointestinal: soft, NT, ND Nervous: A&O x3. no gross focal neurologic deficits, normal speech Extremities: moves all equally, no edema, normal  tone Skin: dry, intact, jaundiced Psychiatry: normal mood, congruent affect  Labs   I have personally reviewed the following labs and imaging studies CBC    Component Value Date/Time   WBC 16.7 (H) 04/27/2024 0405   RBC 2.37 (L) 04/27/2024 0405   HGB 8.2 (L) 04/27/2024 0405   HCT 23.5 (L) 04/27/2024 0405   PLT 193 04/27/2024 0405   MCV 99.2 04/27/2024 0405   MCH 34.6 (H) 04/27/2024 0405   MCHC 34.9 04/27/2024 0405   RDW 17.9 (H) 04/27/2024 0405   LYMPHSABS 2.7 04/27/2024 0042   MONOABS 2.3 (H) 04/27/2024 0042   EOSABS 0.4 04/27/2024 0042   BASOSABS 0.1 04/27/2024 0042      Latest Ref Rng & Units 04/26/2024    2:42 PM 04/14/2024    3:57 AM 04/13/2024    5:19 AM  BMP  Glucose 70 - 99 mg/dL 635  75  96   BUN 6 - 20 mg/dL 8  <5  <5   Creatinine 0.61 - 1.24 mg/dL 9.41  9.38  9.37   Sodium 135 - 145 mmol/L 127  138  138   Potassium 3.5 - 5.1 mmol/L 4.2  3.5  3.0   Chloride 98 - 111 mmol/L 95  101  101   CO2 22 - 32 mmol/L 15  29  29    Calcium 8.9 - 10.3 mg/dL 7.4  7.9  7.9     CT Angio Chest PE W and/or Wo Contrast Result Date: 04/26/2024 CLINICAL DATA:  Chest and abdominal pain. Concern for pulmonary embolism. Cirrhosis with bloody emesis. EXAM: CT ANGIOGRAPHY CHEST CT ABDOMEN AND PELVIS WITH CONTRAST TECHNIQUE: Multidetector CT imaging of the chest was performed using the standard protocol during bolus administration of intravenous contrast. Multiplanar CT image reconstructions and MIPs were obtained to evaluate the vascular anatomy. Multidetector CT imaging of the abdomen and pelvis was performed using the standard protocol during bolus administration of intravenous contrast. RADIATION DOSE REDUCTION: This exam was performed according to the departmental dose-optimization program which includes automated exposure control, adjustment of the mA and/or kV according to patient size and/or use of iterative reconstruction technique. CONTRAST:  OMNIPAQUE  IOHEXOL  350 MG/ML SOLN  COMPARISON:  Chest radiograph dated 04/26/2024 and abdominal CT dated 04/11/2024. FINDINGS: CTA CHEST FINDINGS Cardiovascular: There is no cardiomegaly or pericardial effusion. The thoracic aorta is unremarkable. The origins of the great vessels of the aortic arch are patent. No pulmonary artery embolus identified. Mediastinum/Nodes: No hilar or mediastinal adenopathy. The esophagus is grossly unremarkable. No mediastinal fluid collection. Lungs/Pleura: There is a 15 mm nodule in the superior segment of the right lower lobe. No focal consolidation, pleural effusion or pneumothorax. The central airways are patent. Musculoskeletal: No acute osseous pathology. Old appearing fracture of the anterior left 4th-7th ribs. Review of the MIP images confirms the above findings. CT ABDOMEN and PELVIS FINDINGS No intra-abdominal free air.  Small ascites. Hepatobiliary: Cirrhosis. No biliary dilatation. The gallbladder is unremarkable. Pancreas: Unremarkable. No pancreatic ductal dilatation or surrounding inflammatory changes. Spleen: Normal in size  without focal abnormality. Adrenals/Urinary Tract: The adrenal glands, kidneys, visualized ureters, and urinary bladder unremarkable. Stomach/Bowel: There is no bowel obstruction or active inflammation. The appendix is normal. Vascular/Lymphatic: Mild aortoiliac atherosclerotic disease. The IVC is unremarkable. No portal gas. Para soft Jewel of versus and recanalization of the umbilical vein in keeping with portal hypertension. There is no adenopathy. Reproductive: The prostate and seminal vesicles are grossly unremarkable. Other: None Musculoskeletal: No acute or significant osseous findings. Review of the MIP images confirms the above findings. IMPRESSION: 1. No acute intrathoracic, abdominal, or pelvic pathology. No CT evidence of pulmonary artery embolus. 2. A 15 mm nodule in the superior segment of the right lower lobe. Consider one of the following in 3 months for both low-risk  and high-risk individuals: (a) repeat chest CT, (b) follow-up PET-CT, or (c) tissue sampling. This recommendation follows the consensus statement: Guidelines for Management of Incidental Pulmonary Nodules Detected on CT Images: From the Fleischner Society 2017; Radiology 2017; 284:228-243. 3. Cirrhosis with small ascites and evidence of portal hypertension. 4. No bowel obstruction. Normal appendix. 5.  Aortic Atherosclerosis (ICD10-I70.0). Electronically Signed   By: Vanetta Chou M.D.   On: 04/26/2024 18:10   CT ABDOMEN PELVIS W CONTRAST Result Date: 04/26/2024 CLINICAL DATA:  Chest and abdominal pain. Concern for pulmonary embolism. Cirrhosis with bloody emesis. EXAM: CT ANGIOGRAPHY CHEST CT ABDOMEN AND PELVIS WITH CONTRAST TECHNIQUE: Multidetector CT imaging of the chest was performed using the standard protocol during bolus administration of intravenous contrast. Multiplanar CT image reconstructions and MIPs were obtained to evaluate the vascular anatomy. Multidetector CT imaging of the abdomen and pelvis was performed using the standard protocol during bolus administration of intravenous contrast. RADIATION DOSE REDUCTION: This exam was performed according to the departmental dose-optimization program which includes automated exposure control, adjustment of the mA and/or kV according to patient size and/or use of iterative reconstruction technique. CONTRAST:  OMNIPAQUE  IOHEXOL  350 MG/ML SOLN COMPARISON:  Chest radiograph dated 04/26/2024 and abdominal CT dated 04/11/2024. FINDINGS: CTA CHEST FINDINGS Cardiovascular: There is no cardiomegaly or pericardial effusion. The thoracic aorta is unremarkable. The origins of the great vessels of the aortic arch are patent. No pulmonary artery embolus identified. Mediastinum/Nodes: No hilar or mediastinal adenopathy. The esophagus is grossly unremarkable. No mediastinal fluid collection. Lungs/Pleura: There is a 15 mm nodule in the superior segment of the  right lower lobe. No focal consolidation, pleural effusion or pneumothorax. The central airways are patent. Musculoskeletal: No acute osseous pathology. Old appearing fracture of the anterior left 4th-7th ribs. Review of the MIP images confirms the above findings. CT ABDOMEN and PELVIS FINDINGS No intra-abdominal free air.  Small ascites. Hepatobiliary: Cirrhosis. No biliary dilatation. The gallbladder is unremarkable. Pancreas: Unremarkable. No pancreatic ductal dilatation or surrounding inflammatory changes. Spleen: Normal in size without focal abnormality. Adrenals/Urinary Tract: The adrenal glands, kidneys, visualized ureters, and urinary bladder unremarkable. Stomach/Bowel: There is no bowel obstruction or active inflammation. The appendix is normal. Vascular/Lymphatic: Mild aortoiliac atherosclerotic disease. The IVC is unremarkable. No portal gas. Para soft Jewel of versus and recanalization of the umbilical vein in keeping with portal hypertension. There is no adenopathy. Reproductive: The prostate and seminal vesicles are grossly unremarkable. Other: None Musculoskeletal: No acute or significant osseous findings. Review of the MIP images confirms the above findings. IMPRESSION: 1. No acute intrathoracic, abdominal, or pelvic pathology. No CT evidence of pulmonary artery embolus. 2. A 15 mm nodule in the superior segment of the right lower lobe. Consider one of the  following in 3 months for both low-risk and high-risk individuals: (a) repeat chest CT, (b) follow-up PET-CT, or (c) tissue sampling. This recommendation follows the consensus statement: Guidelines for Management of Incidental Pulmonary Nodules Detected on CT Images: From the Fleischner Society 2017; Radiology 2017; 284:228-243. 3. Cirrhosis with small ascites and evidence of portal hypertension. 4. No bowel obstruction. Normal appendix. 5.  Aortic Atherosclerosis (ICD10-I70.0). Electronically Signed   By: Vanetta Chou M.D.   On: 04/26/2024  18:10   DG Chest Portable 1 View Result Date: 04/26/2024 CLINICAL DATA:  Bloody emesis, diarrhea and shortness of breath. EXAM: PORTABLE CHEST 1 VIEW COMPARISON:  None Available. FINDINGS: The heart size and mediastinal contours are within normal limits. A stable, likely benign, 2 mm nodular opacity is seen along the periphery of the right upper lobe. An ill-defined 14 mm nodular opacity is also seen overlying the mid right lung. This is not clearly identified on the prior study. No acute infiltrate, pleural effusion or pneumothorax is identified. The visualized skeletal structures are unremarkable. IMPRESSION: 1. No acute cardiopulmonary disease. 2. Ill-defined nodular opacity overlying the mid right lung. Correlation with nonemergent chest CT is recommended to exclude the presence of an underlying pulmonary nodule. Electronically Signed   By: Suzen Dials M.D.   On: 04/26/2024 16:08   Disposition Plan & Communication  Patient status: Inpatient  Admitted From: Home Planned disposition location: Home Anticipated discharge date: TBD pending GI clearance   Family Communication: girlfriend at bedside    Author: Marien LITTIE Piety, DO [facility] 04/27/2024, 7:33 AM   Available by Epic secure chat 7AM-7PM. If 7PM-7AM, please contact night-coverage.  [facility] contact information found on [email or web address].  "

## 2024-04-27 NOTE — Consult Note (Signed)
 "                                               Consultation Note   Referring Provider:   [facility] PCP: Harl Jayson CROME, MD (Inactive) Primary Gastroenterologist:      Dr. Eartha Reason for Consultation:  Cirrhosis; upper GI bleed DOA: 04/26/2024         Hospital Day: 2   ASSESSMENT    43 year old male with alcohol related decompensated cirrhosis with recent admission to [facility] for alcohol related hepatitis and esophageal variceal bleed s/p banding x 6.  Now readmitted with recurrent upper GI bleeding possibly secondary to post banding ulcers?  MELD 3.0: 21 at 04/26/2024  4:06 PM MELD-Na: 23 at 04/26/2024  4:06 PM Liver chemistries continue to improve compared to recent hospitalization.  INR normal at 1.2.  Only small ascites on CT scan.  No encephalopathy  Acute blood loss anemia Presenting hemoglobin 7.1, down from 14.   Today: Hemoglobin improved to 8.2 following 2 units RBCs  Alcohol use disorder Denies any alcohol use between now and time of last hospital discharge on 04/14/2024  Liver lesions in setting of cirrhosis on MRI . There is a lesion in the medial segment left liver compatible with LI-RADS category 3, intermediate probability of malignancy.  AFP  is 2.8.  Three month follow up recommended.  Pulmonary nodule, incidental finding on imaging A 15 mm nodule in the superior segment of the right lower lobe on CT scan.Consider one of the following in 3 months for both low-risk and high-risk individuals: (a) repeat chest CT, (b) follow-up PET-CT, or (c) tissue sampling.   Diabetes  Tobacco abuse  See PMH for any additional medical history  / medical problems  Principal Problem:   GI bleed Active Problems:   Alcoholic cirrhosis (HCC)   Secondary esophageal varices with bleeding (HCC)   Portal hypertensive gastropathy (HCC)   Hyponatremia    PLAN:   --Clear liquids for now --Continue empiric Rocephin  --Continue twice daily IV  pantoprazole  --Continue vitamin B1 -- Will add 4 times daily Carafate  suspension --?  I do not know that there is a need to continue the octreotide  at this point --Schedule for EGD tomorrow. The risks and benefits of EGD with possible biopsies were discussed with the patient who agrees to proceed.    HPI   Brief History:  43 year old male with decompensated alcoholic cirrhosis and history of variceal bleeding in 2022. He was admitted to [facility] earlier this month with Etoh hepatitis, decompensated cirrhosis with variceal bleeding. In addition to ongoing Etoh use he had lost job and was unable to afford nadolol  for prior to 6 months.   EGD showed large esophageal varices with wale signs, 6 bands placed. His hemoglobin remained in the normal range .  Carvedilol  was resumed upon discharge.  He was continued on twice daily PPI for 6 weeks .  GI was to schedule him for outpatient EGD for repeat banding in 4 weeks .  During that admission a CT scan showed only trace ascites but did show multiple hypodense liver lesions Subsequently an MRI also showed a liver lesion in the medial segment left liver compatible with LI-RADS category 3, intermediate probability of malignancy.  AFP was less than 3.    Patient was discharged from [facility] on 04/14/2024 at which time  his hemoglobin was 14  **Since hospital discharge  A couple of days ago days patient took some liver detox pills from [place]. On Tuesday he began having loose stool which eventually turned bloody.  He then began vomiting red blood. He had already been having upper abdominal / chest discomfort since EGD with banding but not worsening or change in pain with onset of bleeding. Bleeding associated lightheadedness. He called EMS yesterday, brought to ED.    Admission studies / evaluation notable for:  mild hypotension, tachycardia,.  Hgb 7.1 ( down from 14), WBC 22K, lactic acid 5 , hemoglobin 7.1, MCV 108, sodium 127, lipase 75, AST 137, ALT  53, T. bili 5.6  There was concern for upper GI bleeding from post variceal banding ulcers which could need IR intervention so patient was transferred to [facility] .                                                     Interval History:  No emesis yesterday nor today. Had black BM last night around 11 pm, no overt bleeding or BMs since.   Hemoglobin improved to 8.2 after 2 units RBCs. WBC has improved to 16K, lactic acid level improving. Feels okay today. Since recent discharge patient has not had any NSAIDS. He has not had any Etoh. He isn't familiar with all of his medications but thinks he is taking coreg .    Recent Labs    04/26/24 1442  PROT 6.7  ALBUMIN  2.7*  AST 137*  ALT 53*  ALKPHOS 99  BILITOT 5.6*   Recent Labs    04/26/24 1442 04/27/24 0042 04/27/24 0405  WBC 22.1* 17.6* 16.7*  HGB 7.1* 7.9* 8.2*  HCT 20.7* 22.8* 23.5*  MCV 108.9* 101.3* 99.2  PLT 269 190 193   Recent Labs    04/26/24 1442  NA 127*  K 4.2  CL 95*  CO2 15*  GLUCOSE 364*  BUN 8  CREATININE 0.58*  CALCIUM 7.4*    Previous pertinent Gi studies:   EGD 04/13/2024 by Dr. Cinderella showed 3 three columns of grade III large ( > 5 mm) esophageal varices.  Red wale signs were present. Incompletely eradicated.  Banded.  Moderate portal hypertensive gastropathy was found which was biopsied.  Bands were placed with incomplete eradication of the varices.  Normal duodenal bulb and second portion of the duodenum  Several upper endoscopies in 2022, 2023 for history of EGD /band ligation  Review of Systems: All systems reviewed and negative except where noted in HPI.  Physical Exam: Vital signs in last 24 hours: Temp:  [97.8 F (36.6 C)-98.2 F (36.8 C)] 98.1 F (36.7 C) (12/25 0803) Pulse Rate:  [92-125] 92 (12/25 0803) Resp:  [11-33] 11 (12/25 0803) BP: (101-127)/(59-90) 108/70 (12/25 0803) SpO2:  [94 %-99 %] 98 % (12/25 0803) Weight:  [88.2 kg-88.5 kg] 88.2 kg (12/25 0306) General:  Pleasant male in  NAD. Family in room Psych:  Cooperative. Normal mood and affect Eyes: Pupils equal Ears:  Normal auditory acuity Nose: No deformity, discharge or lesions Neck:  Supple, no masses felt Lungs:  Clear to auscultation.  Heart:  Regular rate, regular rhythm.  Abdomen:  Soft, nondistended, nontender, active bowel sounds, no masses felt Rectal :  Deferred Msk: Symmetrical without gross deformities.  Neurologic:  Alert, oriented, grossly normal neurologically  Extremities : No edema Skin:  Intact without significant lesions.   OUTPATIENT MEDICATIONS Prior to Admission medications  Medication Sig Start Date End Date Taking? Authorizing Provider  Blood Glucose Monitoring Suppl DEVI 1 each by Does not apply route in the morning, at noon, and at bedtime. May substitute to any manufacturer covered by patient's insurance. 01/08/23   Suellen Cantor A, PA-C  carvedilol  (COREG ) 3.125 MG tablet Take 1 tablet (3.125 mg total) by mouth 2 (two) times daily with a meal. 04/14/24   Tat, Alm, MD  cefdinir  (OMNICEF ) 300 MG capsule Take 1 capsule (300 mg total) by mouth 2 (two) times daily. 04/14/24   Evonnie Alm, MD  cetirizine (ZYRTEC) 10 MG tablet Take 10 mg by mouth at bedtime.    [provider]  folic acid  (FOLVITE ) 1 MG tablet Take 1 tablet (1 mg total) by mouth daily. 04/15/24   Evonnie Alm, MD  lactulose  (CHRONULAC ) 10 GM/15ML solution TAKE 15 ML BY MOUTH  TWICE DAILY Patient taking differently: Take 15 mLs by mouth 2 (two) times daily as needed for mild constipation. 08/26/22   Carlan, Mitzie CROME, NP  metFORMIN  (GLUCOPHAGE ) 500 MG tablet Take 1 tablet (500 mg total) by mouth 2 (two) times daily with a meal. 04/14/24   Tat, Alm, MD  metroNIDAZOLE  (FLAGYL ) 500 MG tablet Take 1 tablet (500 mg total) by mouth every 12 (twelve) hours. 04/14/24   Evonnie Alm, MD  Multiple Vitamin (MULTIVITAMIN WITH MINERALS) TABS tablet Take 1 tablet by mouth daily.    [provider]  pantoprazole  (PROTONIX ) 40  MG tablet Take 1 tablet (40 mg total) by mouth 2 (two) times daily. 04/14/24   Evonnie Alm, MD    Allergies as of 04/26/2024   (No Known Allergies)    INPATIENT MEDICATIONS Current Facility-Administered Medications  Medication Dose Route Frequency Provider Last Rate Last Admin   0.9 %  sodium chloride  infusion   Intravenous Continuous Emokpae, Ejiroghene E, MD 75 mL/hr at 04/27/24 0358 New Bag at 04/27/24 0358   acetaminophen  (TYLENOL ) tablet 650 mg  650 mg Oral Q6H PRN Emokpae, Ejiroghene E, MD       Or   acetaminophen  (TYLENOL ) suppository 650 mg  650 mg Rectal Q6H PRN Emokpae, Ejiroghene E, MD       cefTRIAXone  (ROCEPHIN ) 2 g in sodium chloride  0.9 % 100 mL IVPB  2 g Intravenous Q24H Emokpae, Ejiroghene E, MD       folic acid  (FOLVITE ) tablet 1 mg  1 mg Oral Daily Emokpae, Ejiroghene E, MD       insulin  aspart (novoLOG ) injection 0-9 Units  0-9 Units Subcutaneous Q4H Emokpae, Ejiroghene E, MD   2 Units at 04/27/24 0405   multivitamin with minerals tablet 1 tablet  1 tablet Oral Daily Emokpae, Ejiroghene E, MD       octreotide  (SANDOSTATIN ) 500 mcg in sodium chloride  0.9 % 250 mL (2 mcg/mL) infusion  50 mcg/hr Intravenous Continuous Templeton, James H, PA-C 25 mL/hr at 04/27/24 0144 50 mcg/hr at 04/27/24 0144   ondansetron  (ZOFRAN ) tablet 4 mg  4 mg Oral Q6H PRN Emokpae, Ejiroghene E, MD       Or   ondansetron  (ZOFRAN ) injection 4 mg  4 mg Intravenous Q6H PRN Emokpae, Ejiroghene E, MD       pantoprazole  (PROTONIX ) injection 40 mg  40 mg Intravenous Q12H Emokpae, Ejiroghene E, MD   40 mg at 04/27/24 0646   polyethylene glycol (MIRALAX  / GLYCOLAX ) packet 17 g  17  g Oral Daily PRN Emokpae, Ejiroghene E, MD       thiamine  (VITAMIN B1) tablet 100 mg  100 mg Oral Daily Emokpae, Ejiroghene E, MD       Or   thiamine  (VITAMIN B1) injection 100 mg  100 mg Intravenous Daily Emokpae, Ejiroghene E, MD         Past Medical History:  Diagnosis Date   Allergy    Cirrhosis (HCC)    Hypertension      Past Surgical History:  Procedure Laterality Date   ESOPHAGEAL BANDING  04/01/2021   Procedure: ESOPHAGEAL BANDING;  Surgeon: Eartha Angelia Sieving, MD;  [facility]: [facility];  Service: Gastroenterology;;   ESOPHAGEAL BANDING  04/13/2024   Procedure: ESOPHAGOSCOPY, WITH ESOPHAGEAL VARICES BAND LIGATION;  Surgeon: Cinderella [name], MD;  Location: [facility];  Service: Endoscopy;;   ESOPHAGOGASTRODUODENOSCOPY N/A 04/13/2024   Procedure: EGD (ESOPHAGOGASTRODUODENOSCOPY);  Surgeon: Cinderella [name], MD;  [facility]: [facility];  Service: Endoscopy;  Laterality: N/A;   ESOPHAGOGASTRODUODENOSCOPY (EGD) WITH PROPOFOL  N/A 04/01/2021   Procedure: ESOPHAGOGASTRODUODENOSCOPY (EGD) WITH PROPOFOL ;  Surgeon: Eartha Angelia Sieving, MD;  Location: [facility];  Service: Gastroenterology;  Laterality: N/A;   ESOPHAGOGASTRODUODENOSCOPY (EGD) WITH PROPOFOL  N/A 04/29/2021   Procedure: ESOPHAGOGASTRODUODENOSCOPY (EGD) WITH PROPOFOL ;  Surgeon: Eartha Angelia Sieving, MD;  [facility]: [facility];  Service: Gastroenterology;  Laterality: N/A;  8:05   ESOPHAGOGASTRODUODENOSCOPY (EGD) WITH PROPOFOL  N/A 10/24/2021   Procedure: ESOPHAGOGASTRODUODENOSCOPY (EGD) WITH PROPOFOL ;  Surgeon: Eartha Angelia Sieving, MD;  Location: [facility];  Service: Gastroenterology;  Laterality: N/A;  1035   FRACTURE SURGERY     rod inside leg      History reviewed. No pertinent family history.  Social History   Socioeconomic History   Marital status: Single    Spouse name: Not on file   Number of children: Not on file   Years of education: Not on file   Highest education level: Not on file  Occupational History   Not on file  Tobacco Use   Smoking status: Every Day    Current packs/day: 0.50    Types: Cigarettes, Cigars    Passive exposure: Current   Smokeless tobacco: Never  Vaping Use   Vaping status: Some Days  Substance and Sexual Activity   Alcohol use: Not Currently    Comment: today    Drug use: Yes    Frequency: 2.0 times per week    Types: Marijuana   Sexual activity: Not on file  Other Topics Concern   Not on file  Social History Narrative   Not on file   Social Drivers of Health   Tobacco Use: High Risk (04/26/2024)   Patient History    Smoking Tobacco Use: Every Day    Smokeless Tobacco Use: Never    Passive Exposure: Current  Financial Resource Strain: Not on file  Food Insecurity: No Food Insecurity (04/27/2024)   Epic    Worried About Programme Researcher, Broadcasting/film/video in the Last Year: Never true    Ran Out of Food in the Last Year: Never true  Transportation Needs: No Transportation Needs (04/27/2024)   Epic    Lack of Transportation (Medical): No    Lack of Transportation (Non-Medical): No  Physical Activity: Not on file  Stress: Not on file  Social Connections: Unknown (09/16/2021)   Received from [facility]   Social Network    Social Network: Not on file  Intimate Partner Violence: Unknown (04/27/2024)   Epic  Fear of Current or Ex-Partner: No    Emotionally Abused: No    Physically Abused: Not on file    Sexually Abused: No  Depression (PHQ2-9): Not on file  Alcohol Screen: Not on file  Housing: Low Risk (04/27/2024)   Epic    Unable to Pay for Housing in the Last Year: No    Number of Times Moved in the Last Year: 0    Homeless in the Last Year: No  Utilities: Not At Risk (04/27/2024)   Epic    Threatened with loss of utilities: No  Health Literacy: Not on file    Code Status   Code Status: Full Code   Vina Dasen, NP-C   04/27/2024, 8:49 AM     "

## 2024-04-27 NOTE — Plan of Care (Signed)

## 2024-04-28 ENCOUNTER — Inpatient Hospital Stay (HOSPITAL_COMMUNITY): Payer: Self-pay | Admitting: Anesthesiology

## 2024-04-28 ENCOUNTER — Other Ambulatory Visit (HOSPITAL_COMMUNITY): Payer: Self-pay

## 2024-04-28 ENCOUNTER — Encounter (HOSPITAL_COMMUNITY)
Admission: EM | Disposition: A | Payer: Self-pay | Source: Home / Self Care | Attending: Student in an Organized Health Care Education/Training Program

## 2024-04-28 ENCOUNTER — Encounter (HOSPITAL_COMMUNITY): Payer: Self-pay | Admitting: Internal Medicine

## 2024-04-28 DIAGNOSIS — F17210 Nicotine dependence, cigarettes, uncomplicated: Secondary | ICD-10-CM

## 2024-04-28 DIAGNOSIS — I10 Essential (primary) hypertension: Secondary | ICD-10-CM

## 2024-04-28 DIAGNOSIS — I8500 Esophageal varices without bleeding: Secondary | ICD-10-CM

## 2024-04-28 DIAGNOSIS — Z9889 Other specified postprocedural states: Secondary | ICD-10-CM

## 2024-04-28 HISTORY — PX: ESOPHAGOGASTRODUODENOSCOPY: SHX5428

## 2024-04-28 LAB — BASIC METABOLIC PANEL WITH GFR
Anion gap: 7 (ref 5–15)
BUN: <5 mg/dL — ABNORMAL LOW (ref 6–20)
CO2: 24 mmol/L (ref 22–32)
Calcium: 7 mg/dL — ABNORMAL LOW (ref 8.9–10.3)
Chloride: 105 mmol/L (ref 98–111)
Creatinine, Ser: 0.58 mg/dL — ABNORMAL LOW (ref 0.61–1.24)
GFR, Estimated: >60 mL/min
Glucose, Bld: 99 mg/dL (ref 70–99)
Potassium: 3.4 mmol/L — ABNORMAL LOW (ref 3.5–5.1)
Sodium: 135 mmol/L (ref 135–145)

## 2024-04-28 LAB — GLUCOSE, CAPILLARY
Glucose-Capillary: 119 mg/dL — ABNORMAL HIGH (ref 70–99)
Glucose-Capillary: 136 mg/dL — ABNORMAL HIGH (ref 70–99)
Glucose-Capillary: 143 mg/dL — ABNORMAL HIGH (ref 70–99)
Glucose-Capillary: 165 mg/dL — ABNORMAL HIGH (ref 70–99)
Glucose-Capillary: 174 mg/dL — ABNORMAL HIGH (ref 70–99)
Glucose-Capillary: 264 mg/dL — ABNORMAL HIGH (ref 70–99)
Glucose-Capillary: 87 mg/dL (ref 70–99)

## 2024-04-28 LAB — HEMOGLOBIN AND HEMATOCRIT, BLOOD
HCT: 22.1 % — ABNORMAL LOW (ref 39.0–52.0)
Hemoglobin: 7.5 g/dL — ABNORMAL LOW (ref 13.0–17.0)

## 2024-04-28 MED ORDER — CARVEDILOL 3.125 MG PO TABS
3.1250 mg | ORAL_TABLET | Freq: Two times a day (BID) | ORAL | Status: DC
  Administered 2024-04-28 – 2024-04-30 (×4): 3.125 mg via ORAL
  Filled 2024-04-28 (×4): qty 1

## 2024-04-28 MED ORDER — PHENYLEPHRINE HCL (PRESSORS) 10 MG/ML IV SOLN
INTRAVENOUS | Status: DC | PRN
  Administered 2024-04-28 (×3): 120 ug via INTRAVENOUS

## 2024-04-28 MED ORDER — RIFAXIMIN 550 MG PO TABS
550.0000 mg | ORAL_TABLET | Freq: Two times a day (BID) | ORAL | Status: DC
  Administered 2024-04-28 – 2024-04-30 (×5): 550 mg via ORAL
  Filled 2024-04-28 (×5): qty 1

## 2024-04-28 MED ORDER — LIDOCAINE 2% (20 MG/ML) 5 ML SYRINGE
INTRAMUSCULAR | Status: DC | PRN
  Administered 2024-04-28: 100 mg via INTRAVENOUS

## 2024-04-28 MED ORDER — PROPOFOL 10 MG/ML IV BOLUS
INTRAVENOUS | Status: DC | PRN
  Administered 2024-04-28: 100 mg via INTRAVENOUS
  Administered 2024-04-28 (×3): 50 mg via INTRAVENOUS

## 2024-04-28 MED ORDER — SODIUM CHLORIDE 0.9 % IV SOLN: INTRAVENOUS | Status: DC

## 2024-04-28 MED ORDER — PROPOFOL 500 MG/50ML IV EMUL
INTRAVENOUS | Status: DC | PRN
  Administered 2024-04-28: 150 ug/kg/min via INTRAVENOUS

## 2024-04-28 NOTE — Anesthesia Postprocedure Evaluation (Signed)
"   Anesthesia Post Note  Patient: Craig Andrews  Procedure(s) Performed: EGD (ESOPHAGOGASTRODUODENOSCOPY)     Patient location during evaluation: PACU Anesthesia Type: MAC Level of consciousness: awake and alert and oriented Pain management: pain level controlled Vital Signs Assessment: post-procedure vital signs reviewed and stable Respiratory status: spontaneous breathing, nonlabored ventilation and respiratory function stable Cardiovascular status: stable and blood pressure returned to baseline Postop Assessment: no apparent nausea or vomiting Anesthetic complications: no   No notable events documented.  Last Vitals:  Vitals:   04/28/24 1300 04/28/24 1310  BP: 98/81 98/63  Pulse: 91 90  Resp: (!) 24 20  Temp:    SpO2: 97% 98%    Last Pain:  Vitals:   04/28/24 1310  TempSrc:   PainSc: 0-No pain                 Kitty Cadavid A.      "

## 2024-04-28 NOTE — Plan of Care (Signed)
 Patient is progressing towards goals of care.     Problem: Education: Goal: Knowledge of General Education information will improve Description: Including pain rating scale, medication(s)/side effects and non-pharmacologic comfort measures Outcome: Progressing   Problem: Health Behavior/Discharge Planning: Goal: Ability to manage health-related needs will improve Outcome: Progressing   Problem: Clinical Measurements: Goal: Ability to maintain clinical measurements within normal limits will improve Outcome: Progressing Goal: Will remain free from infection Outcome: Progressing Goal: Diagnostic test results will improve Outcome: Progressing Goal: Respiratory complications will improve Outcome: Progressing Goal: Cardiovascular complication will be avoided Outcome: Progressing   Problem: Activity: Goal: Risk for activity intolerance will decrease Outcome: Progressing   Problem: Nutrition: Goal: Adequate nutrition will be maintained Outcome: Progressing   Problem: Coping: Goal: Level of anxiety will decrease Outcome: Progressing   Problem: Elimination: Goal: Will not experience complications related to bowel motility Outcome: Progressing Goal: Will not experience complications related to urinary retention Outcome: Progressing   Problem: Pain Managment: Goal: General experience of comfort will improve and/or be controlled Outcome: Progressing   Problem: Safety: Goal: Ability to remain free from injury will improve Outcome: Progressing   Problem: Skin Integrity: Goal: Risk for impaired skin integrity will decrease Outcome: Progressing   Problem: Education: Goal: Ability to describe self-care measures that may prevent or decrease complications (Diabetes Survival Skills Education) will improve Outcome: Progressing Goal: Individualized Educational Video(s) Outcome: Progressing   Problem: Coping: Goal: Ability to adjust to condition or change in health will  improve Outcome: Progressing   Problem: Fluid Volume: Goal: Ability to maintain a balanced intake and output will improve Outcome: Progressing   Problem: Health Behavior/Discharge Planning: Goal: Ability to identify and utilize available resources and services will improve Outcome: Progressing Goal: Ability to manage health-related needs will improve Outcome: Progressing   Problem: Metabolic: Goal: Ability to maintain appropriate glucose levels will improve Outcome: Progressing   Problem: Nutritional: Goal: Maintenance of adequate nutrition will improve Outcome: Progressing Goal: Progress toward achieving an optimal weight will improve Outcome: Progressing   Problem: Skin Integrity: Goal: Risk for impaired skin integrity will decrease Outcome: Progressing   Problem: Tissue Perfusion: Goal: Adequacy of tissue perfusion will improve Outcome: Progressing

## 2024-04-28 NOTE — Progress Notes (Signed)
 " PROGRESS NOTE Craig Andrews    DOB: [birth year], 43 y.o.  FMW:[id number]    Code Status: Full Code   DOA: 04/26/2024   LOS: 2  Brief hospital course  Craig Andrews is a 43 y.o. male with a PMH significant for alcoholic liver cirrhosis, SBP, hypertension with esophageal varices. Recently hospitalized 12/9 to 12/12 for DKA, and GI bleed.  He was placed on insulin  drip.  Underwent EGD 12/11-grade 3 and large (> 5mm) esophageal varices.  Incompletely eradicated. Banded. Portal hypertensive gastropathy. Patient presented to the ED with complaints of multiple episodes of bloody stools, and 1 episode of bloody emesis.  He took a liver detox medicine from [employer] and ibuprofen  prior to these episodes.    ED Course: Temperature 98.  Heart rate 92-125.  Respiratory rate 15-33.  Blood pressure systolic 103-127.  O2 sats greater than 96% on room air. Hemoglobin 7.1, baseline 13-14.  IV ceftriaxone  1 g given, octreotide , Protonix  80 mg x 1 given. 500 bolus given, 2 unit PRBC ordered for transfusion.  04/28/2024 -stable. GI following. EGD today  Assessment & Plan  Principal Problem:   GI bleed Active Problems:   Alcoholic cirrhosis (HCC)   Secondary esophageal varices with bleeding (HCC)   Portal hypertensive gastropathy (HCC)   Hyponatremia  Acute GI bleed with hematemesis and hematochezia.  S/p esophageal variceal banding esophageal varices 12/11-  He took OTC NSAID and liver detox med that may have contributed to re-bleed.  Hgb as low as 7.1 on presentation. Received 2 units pRBC Hgb is stable at 7.5 this am. Transfusion threshold is <7.  No overt bleeding this morning. CT abdomen and pelvis with contrast -no acute abnormality, cirrhosis with small ascites and evidence of portal hypertension. - GI following,    - Return patient to hospital ward for ongoing care. - In the absence of active bleeding can discontinue octreotide  drip - Continue IV PPI twice daily - Continue Carafate  in the  hospital and after discharge for treatment of post banding ulcers. - Start Coreg  3. 125 mg po BID for management of esophageal varices. - Patient will need repeat EGD with primary GI in [place] in 2- 4 weeks for reassessment of varices and potential banding. - May advance diet to 2g sodium diet. - Continue to monitor for bleeding, serial H/ H after cessation of octreotide . - trend H&H - ppx antibiotics for SBP    Lactic acidosis-5 >> 2.8.  In the setting of GI bleed, diarrhea, and liver disease. - bolus given, 2 units PRBCs given - Trend lactic acid   Hyponatremia-sodium 127>>135 with IVF. - Trend  Hypokalemia- K+ 3.4 today with blood loss. Will replace   Alcoholic liver cirrhosis, portal hypertension, elevated liver enzymes- reports last alcoholic beverage was 12/9 when he was hospitalized. On lactulose  as needed for goal of 3 BM per day.  AST 127.  ALT 53.  ALP 99, T. bili 5.6. - Thiamine  folate multivitamins - if BP can tolerate, titrate on spironolactone , BB, and rifaxamine  - trend INR, liver labs   Diabetes mellitus with hyperglycemia - Hold metformin  - SSI- with meals    Pulmonary nodule- A 15 mm nodule in the superior segment of the right lower lobe. Consider one of the following in 3 months for both low-risk and high-risk individuals: (a) repeat chest CT, (b) follow-up PET-CT, or (c) tissue sampling. - Follow-up as outpatient  Body mass index is 27.12 kg/m.  VTE ppx: SCDs Start: 04/27/24 9661  Diet:  Diet   Diet NPO time specified Except for: Sips with Meds   Consultants: GI  Subjective 04/28/2024    Pt reports getting poor sleep. No abdominal pain or further bleeding. Complains of being woken up and wants to leave the hospital.    Objective  Blood pressure 118/75, pulse 96, temperature 97.8 F (36.6 C), temperature source Oral, resp. rate 20, height 5' 11 (1.803 m), weight 88.2 kg, SpO2 98%.  Intake/Output Summary (Last 24 hours) at 04/28/2024  0723 Last data filed at 04/28/2024 0531 Gross per 24 hour  Intake 150 ml  Output 400 ml  Net -250 ml   Filed Weights   04/26/24 1503 04/27/24 0306  Weight: 88.5 kg 88.2 kg     Physical Exam:  General: awake, alert, NAD Respiratory: normal respiratory effort. Cardiovascular: extremities well perfused Gastrointestinal: soft, NT, ND Nervous: A&O x3. no gross focal neurologic deficits, normal speech Extremities: moves all equally, no edema, normal tone Skin: dry, intact, jaundiced Psychiatry: normal mood, congruent affect  Labs   I have personally reviewed the following labs and imaging studies CBC    Component Value Date/Time   WBC 12.4 (H) 04/27/2024 2025   RBC 2.12 (L) 04/27/2024 2025   HGB 7.4 (L) 04/27/2024 2025   HCT 21.7 (L) 04/27/2024 2025   PLT 157 04/27/2024 2025   MCV 102.4 (H) 04/27/2024 2025   MCH 34.9 (H) 04/27/2024 2025   MCHC 34.1 04/27/2024 2025   RDW 18.0 (H) 04/27/2024 2025   LYMPHSABS 2.7 04/27/2024 0042   MONOABS 2.3 (H) 04/27/2024 0042   EOSABS 0.4 04/27/2024 0042   BASOSABS 0.1 04/27/2024 0042      Latest Ref Rng & Units 04/28/2024    4:19 AM 04/27/2024    1:08 PM 04/26/2024    2:42 PM  BMP  Glucose 70 - 99 mg/dL 99  772  635   BUN 6 - 20 mg/dL 5  6  8    Creatinine 0.61 - 1.24 mg/dL 9.41  9.34  9.41   Sodium 135 - 145 mmol/L 135  129  127   Potassium 3.5 - 5.1 mmol/L 3.4  3.8  4.2   Chloride 98 - 111 mmol/L 105  101  95   CO2 22 - 32 mmol/L 24  21  15    Calcium 8.9 - 10.3 mg/dL 7.0  6.8  7.4     CT Angio Chest PE W and/or Wo Contrast Result Date: 04/26/2024 CLINICAL DATA:  Chest and abdominal pain. Concern for pulmonary embolism. Cirrhosis with bloody emesis. EXAM: CT ANGIOGRAPHY CHEST CT ABDOMEN AND PELVIS WITH CONTRAST TECHNIQUE: Multidetector CT imaging of the chest was performed using the standard protocol during bolus administration of intravenous contrast. Multiplanar CT image reconstructions and MIPs were obtained to evaluate the  vascular anatomy. Multidetector CT imaging of the abdomen and pelvis was performed using the standard protocol during bolus administration of intravenous contrast. RADIATION DOSE REDUCTION: This exam was performed according to the departmental dose-optimization program which includes automated exposure control, adjustment of the mA and/or kV according to patient size and/or use of iterative reconstruction technique. CONTRAST:  OMNIPAQUE  IOHEXOL  350 MG/ML SOLN COMPARISON:  Chest radiograph dated 04/26/2024 and abdominal CT dated 04/11/2024. FINDINGS: CTA CHEST FINDINGS Cardiovascular: There is no cardiomegaly or pericardial effusion. The thoracic aorta is unremarkable. The origins of the great vessels of the aortic arch are patent. No pulmonary artery embolus identified. Mediastinum/Nodes: No hilar or mediastinal adenopathy. The esophagus is grossly unremarkable. No mediastinal fluid collection.  Lungs/Pleura: There is a 15 mm nodule in the superior segment of the right lower lobe. No focal consolidation, pleural effusion or pneumothorax. The central airways are patent. Musculoskeletal: No acute osseous pathology. Old appearing fracture of the anterior left 4th-7th ribs. Review of the MIP images confirms the above findings. CT ABDOMEN and PELVIS FINDINGS No intra-abdominal free air.  Small ascites. Hepatobiliary: Cirrhosis. No biliary dilatation. The gallbladder is unremarkable. Pancreas: Unremarkable. No pancreatic ductal dilatation or surrounding inflammatory changes. Spleen: Normal in size without focal abnormality. Adrenals/Urinary Tract: The adrenal glands, kidneys, visualized ureters, and urinary bladder unremarkable. Stomach/Bowel: There is no bowel obstruction or active inflammation. The appendix is normal. Vascular/Lymphatic: Mild aortoiliac atherosclerotic disease. The IVC is unremarkable. No portal gas. Para soft Jewel of versus and recanalization of the umbilical vein in keeping with portal  hypertension. There is no adenopathy. Reproductive: The prostate and seminal vesicles are grossly unremarkable. Other: None Musculoskeletal: No acute or significant osseous findings. Review of the MIP images confirms the above findings. IMPRESSION: 1. No acute intrathoracic, abdominal, or pelvic pathology. No CT evidence of pulmonary artery embolus. 2. A 15 mm nodule in the superior segment of the right lower lobe. Consider one of the following in 3 months for both low-risk and high-risk individuals: (a) repeat chest CT, (b) follow-up PET-CT, or (c) tissue sampling. This recommendation follows the consensus statement: Guidelines for Management of Incidental Pulmonary Nodules Detected on CT Images: From the Fleischner Society 2017; Radiology 2017; 284:228-243. 3. Cirrhosis with small ascites and evidence of portal hypertension. 4. No bowel obstruction. Normal appendix. 5.  Aortic Atherosclerosis (ICD10-I70.0). Electronically Signed   By: Vanetta Chou M.D.   On: 04/26/2024 18:10   CT ABDOMEN PELVIS W CONTRAST Result Date: 04/26/2024 CLINICAL DATA:  Chest and abdominal pain. Concern for pulmonary embolism. Cirrhosis with bloody emesis. EXAM: CT ANGIOGRAPHY CHEST CT ABDOMEN AND PELVIS WITH CONTRAST TECHNIQUE: Multidetector CT imaging of the chest was performed using the standard protocol during bolus administration of intravenous contrast. Multiplanar CT image reconstructions and MIPs were obtained to evaluate the vascular anatomy. Multidetector CT imaging of the abdomen and pelvis was performed using the standard protocol during bolus administration of intravenous contrast. RADIATION DOSE REDUCTION: This exam was performed according to the departmental dose-optimization program which includes automated exposure control, adjustment of the mA and/or kV according to patient size and/or use of iterative reconstruction technique. CONTRAST:  OMNIPAQUE  IOHEXOL  350 MG/ML SOLN COMPARISON:  Chest radiograph dated  04/26/2024 and abdominal CT dated 04/11/2024. FINDINGS: CTA CHEST FINDINGS Cardiovascular: There is no cardiomegaly or pericardial effusion. The thoracic aorta is unremarkable. The origins of the great vessels of the aortic arch are patent. No pulmonary artery embolus identified. Mediastinum/Nodes: No hilar or mediastinal adenopathy. The esophagus is grossly unremarkable. No mediastinal fluid collection. Lungs/Pleura: There is a 15 mm nodule in the superior segment of the right lower lobe. No focal consolidation, pleural effusion or pneumothorax. The central airways are patent. Musculoskeletal: No acute osseous pathology. Old appearing fracture of the anterior left 4th-7th ribs. Review of the MIP images confirms the above findings. CT ABDOMEN and PELVIS FINDINGS No intra-abdominal free air.  Small ascites. Hepatobiliary: Cirrhosis. No biliary dilatation. The gallbladder is unremarkable. Pancreas: Unremarkable. No pancreatic ductal dilatation or surrounding inflammatory changes. Spleen: Normal in size without focal abnormality. Adrenals/Urinary Tract: The adrenal glands, kidneys, visualized ureters, and urinary bladder unremarkable. Stomach/Bowel: There is no bowel obstruction or active inflammation. The appendix is normal. Vascular/Lymphatic: Mild aortoiliac atherosclerotic disease. The IVC  is unremarkable. No portal gas. Para soft Jewel of versus and recanalization of the umbilical vein in keeping with portal hypertension. There is no adenopathy. Reproductive: The prostate and seminal vesicles are grossly unremarkable. Other: None Musculoskeletal: No acute or significant osseous findings. Review of the MIP images confirms the above findings. IMPRESSION: 1. No acute intrathoracic, abdominal, or pelvic pathology. No CT evidence of pulmonary artery embolus. 2. A 15 mm nodule in the superior segment of the right lower lobe. Consider one of the following in 3 months for both low-risk and high-risk individuals: (a)  repeat chest CT, (b) follow-up PET-CT, or (c) tissue sampling. This recommendation follows the consensus statement: Guidelines for Management of Incidental Pulmonary Nodules Detected on CT Images: From the Fleischner Society 2017; Radiology 2017; 284:228-243. 3. Cirrhosis with small ascites and evidence of portal hypertension. 4. No bowel obstruction. Normal appendix. 5.  Aortic Atherosclerosis (ICD10-I70.0). Electronically Signed   By: Vanetta Chou M.D.   On: 04/26/2024 18:10   DG Chest Portable 1 View Result Date: 04/26/2024 CLINICAL DATA:  Bloody emesis, diarrhea and shortness of breath. EXAM: PORTABLE CHEST 1 VIEW COMPARISON:  None Available. FINDINGS: The heart size and mediastinal contours are within normal limits. A stable, likely benign, 2 mm nodular opacity is seen along the periphery of the right upper lobe. An ill-defined 14 mm nodular opacity is also seen overlying the mid right lung. This is not clearly identified on the prior study. No acute infiltrate, pleural effusion or pneumothorax is identified. The visualized skeletal structures are unremarkable. IMPRESSION: 1. No acute cardiopulmonary disease. 2. Ill-defined nodular opacity overlying the mid right lung. Correlation with nonemergent chest CT is recommended to exclude the presence of an underlying pulmonary nodule. Electronically Signed   By: Suzen Dials M.D.   On: 04/26/2024 16:08   Disposition Plan & Communication  Patient status: Inpatient  Admitted From: Home Planned disposition location: Home Anticipated discharge date: TBD pending hgb stable  Family Communication: girlfriend sleeping at bedside    Author: Marien LITTIE Piety, DO [facility] 04/28/2024, 7:23 AM   Available by Epic secure chat 7AM-7PM. If 7PM-7AM, please contact night-coverage.  [facility] contact information found on [email or web address].  "

## 2024-04-28 NOTE — Progress Notes (Signed)
" °   04/28/24 1600  Vitals  Temp 98.2 F (36.8 C)  Temp Source Oral  BP 113/74  MAP (mmHg) 86  BP Location Right Arm  BP Method Automatic  Patient Position (if appropriate) Lying  Pulse Rate 88  Pulse Rate Source Monitor  ECG Heart Rate 88  Resp (!) 28  MEWS COLOR  MEWS Score Color Yellow  Oxygen Therapy  SpO2 96 %  O2 Device Room Air  MEWS Score  MEWS Temp 0  MEWS Systolic 0  MEWS Pulse 0  MEWS RR 2  MEWS LOC 0  MEWS Score 2   RR not true  "

## 2024-04-28 NOTE — Transfer of Care (Signed)
 Immediate Anesthesia Transfer of Care Note  Patient: Craig Andrews  Procedure(s) Performed: EGD (ESOPHAGOGASTRODUODENOSCOPY)  Patient Location: Endoscopy Unit  Anesthesia Type:MAC  Level of Consciousness: drowsy and responds to stimulation  Airway & Oxygen Therapy: Patient Spontanous Breathing  Post-op Assessment: Report given to RN and Post -op Vital signs reviewed and stable  Post vital signs: Reviewed and stable  Last Vitals:  Vitals Value Taken Time  BP    Temp    Pulse 99 04/28/24 12:53  Resp 21 04/28/24 12:53  SpO2 98 % 04/28/24 12:53  Vitals shown include unfiled device data.  Last Pain:  Vitals:   04/28/24 1136  TempSrc: Temporal  PainSc: 0-No pain         Complications: No notable events documented.

## 2024-04-28 NOTE — H&P (Signed)
 [facility] History and Physical   Primary Care Physician:  Harl Jayson CROME, MD (Inactive)   Reason for Procedure:  Melena, hematemesis, anemia  Plan:    Upper endoscopy   The patient was provided an opportunity to ask questions and all were answered. The patient agreed with the plan.   HPI: Craig Andrews is a 43 y.o. male undergoing upper endoscopy for investigation of melena, hematemesis and anemia.  Patient has a history of decompensated alcoholic cirrhosis complicated by variceal bleeding in 2022 and recent variceal bleed 04/13/2024 status post 6 bands who was transferred to [facility] for hematemesis, melena and anemia.  Admission hemoglobin 7.1.  Received transfusion of 2 unit PRBC.  Hemoglobin increased to 8.2 and now stabilized at 7.5.  Denies further overt hematemesis or melena.  Not on anticoagulation.  EGD 04/13/2024 by Dr. Cinderella showed 3 three columns of grade III large ( > 5 mm) esophageal varices.  Red wale signs were present. Incompletely eradicated.  Banded.  Moderate portal hypertensive gastropathy was found which was biopsied.  Bands were placed with incomplete eradication of the varices.  Normal duodenal bulb and second portion of the duodenum   Past Medical History:  Diagnosis Date   Allergy    Cirrhosis (HCC)    Hypertension     Past Surgical History:  Procedure Laterality Date   ESOPHAGEAL BANDING  04/01/2021   Procedure: ESOPHAGEAL BANDING;  Surgeon: Eartha Angelia Sieving, MD;  Location: [facility];  Service: Gastroenterology;;   ESOPHAGEAL BANDING  04/13/2024   Procedure: ESOPHAGOSCOPY, WITH ESOPHAGEAL VARICES BAND LIGATION;  Surgeon: Cinderella [name], MD;  Location: [facility];  Service: Endoscopy;;   ESOPHAGOGASTRODUODENOSCOPY N/A 04/13/2024   Procedure: EGD (ESOPHAGOGASTRODUODENOSCOPY);  Surgeon: Cinderella [name], MD;  Location: [facility];  Service: Endoscopy;  Laterality: N/A;   ESOPHAGOGASTRODUODENOSCOPY (EGD) WITH PROPOFOL   N/A 04/01/2021   Procedure: ESOPHAGOGASTRODUODENOSCOPY (EGD) WITH PROPOFOL ;  Surgeon: Eartha Angelia Sieving, MD;  Location: [facility];  Service: Gastroenterology;  Laterality: N/A;   ESOPHAGOGASTRODUODENOSCOPY (EGD) WITH PROPOFOL  N/A 04/29/2021   Procedure: ESOPHAGOGASTRODUODENOSCOPY (EGD) WITH PROPOFOL ;  Surgeon: Eartha Angelia Sieving, MD;  Location: [facility];  Service: Gastroenterology;  Laterality: N/A;  8:05   ESOPHAGOGASTRODUODENOSCOPY (EGD) WITH PROPOFOL  N/A 10/24/2021   Procedure: ESOPHAGOGASTRODUODENOSCOPY (EGD) WITH PROPOFOL ;  Surgeon: Eartha Angelia Sieving, MD;  Location: [facility];  Service: Gastroenterology;  Laterality: N/A;  1035   FRACTURE SURGERY     rod inside leg      Prior to Admission medications  Medication Sig Start Date End Date Taking? Authorizing Provider  carvedilol  (COREG ) 3.125 MG tablet Take 1 tablet (3.125 mg total) by mouth 2 (two) times daily with a meal. 04/14/24  Yes Tat, Alm, MD  cetirizine (ZYRTEC) 10 MG tablet Take 10 mg by mouth daily as needed for allergies.   Yes [provider]  folic acid  (FOLVITE ) 1 MG tablet Take 1 tablet (1 mg total) by mouth daily. 04/15/24  Yes Tat, Alm, MD  ibuprofen  (ADVIL ) 200 MG tablet Take 400 mg by mouth every 6 (six) hours as needed for moderate pain (pain score 4-6).   Yes [provider]  lactulose  (CHRONULAC ) 10 GM/15ML solution TAKE 15 ML BY MOUTH  TWICE DAILY Patient taking differently: Take 15 mLs by mouth 2 (two) times daily as needed for mild constipation. 08/26/22  Yes Carlan, Chelsea L, NP  metFORMIN  (GLUCOPHAGE ) 500 MG tablet Take 1 tablet (500 mg total) by mouth 2 (two) times daily with  a meal. 04/14/24  Yes Tat, Alm, MD  Multiple Vitamin (MULTIVITAMIN WITH MINERALS) TABS tablet Take 1 tablet by mouth daily.   Yes [provider]  omeprazole  (PRILOSEC ) 20 MG capsule Take 20 mg by mouth daily.   Yes [provider]  pantoprazole  (PROTONIX ) 40 MG  tablet Take 1 tablet (40 mg total) by mouth 2 (two) times daily. 04/14/24  Yes TatAlm, MD  Blood Glucose Monitoring Suppl DEVI 1 each by Does not apply route in the morning, at noon, and at bedtime. May substitute to any manufacturer covered by patient's insurance. 01/08/23   Suellen Cantor A, PA-C    Current Facility-Administered Medications  Medication Dose Route Frequency Provider Last Rate Last Admin   0.9 %  sodium chloride  infusion   Intravenous Continuous Kerman Vina HERO, NP 20 mL/hr at 04/28/24 9167 New Bag at 04/28/24 9167   acetaminophen  (TYLENOL ) tablet 650 mg  650 mg Oral Q6H PRN Emokpae, Ejiroghene E, MD       Or   acetaminophen  (TYLENOL ) suppository 650 mg  650 mg Rectal Q6H PRN Emokpae, Ejiroghene E, MD       cefTRIAXone  (ROCEPHIN ) 2 g in sodium chloride  0.9 % 100 mL IVPB  2 g Intravenous Q24H Emokpae, Ejiroghene E, MD 190 mL/hr at 04/27/24 1714 2 g at 04/27/24 1714   folic acid  (FOLVITE ) tablet 1 mg  1 mg Oral Daily Emokpae, Ejiroghene E, MD   1 mg at 04/27/24 1030   insulin  aspart (novoLOG ) injection 0-9 Units  0-9 Units Subcutaneous Q4H Emokpae, Ejiroghene E, MD   2 Units at 04/28/24 0011   lactulose  (CHRONULAC ) 10 GM/15ML solution 10 g  10 g Oral BID PRN Anderson, Chelsey L, MD       multivitamin with minerals tablet 1 tablet  1 tablet Oral Daily Emokpae, Ejiroghene E, MD   1 tablet at 04/27/24 1030   octreotide  (SANDOSTATIN ) 500 mcg in sodium chloride  0.9 % 250 mL (2 mcg/mL) infusion  50 mcg/hr Intravenous Continuous Templeton, James H, PA-C 25 mL/hr at 04/28/24 0147 50 mcg/hr at 04/28/24 0147   ondansetron  (ZOFRAN ) tablet 4 mg  4 mg Oral Q6H PRN Emokpae, Ejiroghene E, MD       Or   ondansetron  (ZOFRAN ) injection 4 mg  4 mg Intravenous Q6H PRN Emokpae, Ejiroghene E, MD       pantoprazole  (PROTONIX ) injection 40 mg  40 mg Intravenous Q12H Emokpae, Ejiroghene E, MD   40 mg at 04/28/24 0832   sucralfate  (CARAFATE ) 1 GM/10ML suspension 1 g  1 g Oral Q6H Guenther, Paula M, NP    1 g at 04/28/24 9479   thiamine  (VITAMIN B1) tablet 100 mg  100 mg Oral Daily Emokpae, Ejiroghene E, MD       Or   thiamine  (VITAMIN B1) injection 100 mg  100 mg Intravenous Daily Emokpae, Ejiroghene E, MD   100 mg at 04/28/24 9166    Allergies as of 04/26/2024   (No Known Allergies)    History reviewed. No pertinent family history.  Social History   Socioeconomic History   Marital status: Single    Spouse name: Not on file   Number of children: Not on file   Years of education: Not on file   Highest education level: Not on file  Occupational History   Not on file  Tobacco Use   Smoking status: Every Day    Current packs/day: 0.50    Types: Cigarettes, Cigars    Passive exposure: Current  Smokeless tobacco: Never  Vaping Use   Vaping status: Some Days  Substance and Sexual Activity   Alcohol use: Not Currently    Comment: today   Drug use: Yes    Frequency: 2.0 times per week    Types: Marijuana   Sexual activity: Not on file  Other Topics Concern   Not on file  Social History Narrative   Not on file   Social Drivers of Health   Tobacco Use: High Risk (04/26/2024)   Patient History    Smoking Tobacco Use: Every Day    Smokeless Tobacco Use: Never    Passive Exposure: Current  Financial Resource Strain: Not on file  Food Insecurity: No Food Insecurity (04/27/2024)   Epic    Worried About Programme Researcher, Broadcasting/film/video in the Last Year: Never true    Ran Out of Food in the Last Year: Never true  Transportation Needs: No Transportation Needs (04/27/2024)   Epic    Lack of Transportation (Medical): No    Lack of Transportation (Non-Medical): No  Physical Activity: Not on file  Stress: Not on file  Social Connections: Unknown (09/16/2021)   Received from [facility]   Social Network    Social Network: Not on file  Intimate Partner Violence: Unknown (04/27/2024)   Epic    Fear of Current or Ex-Partner: No    Emotionally Abused: No    Physically Abused: Not on  file    Sexually Abused: No  Depression (PHQ2-9): Not on file  Alcohol Screen: Not on file  Housing: Low Risk (04/27/2024)   Epic    Unable to Pay for Housing in the Last Year: No    Number of Times Moved in the Last Year: 0    Homeless in the Last Year: No  Utilities: Not At Risk (04/27/2024)   Epic    Threatened with loss of utilities: No  Health Literacy: Not on file    Review of Systems:  All other review of systems negative except as mentioned in the HPI.  Physical Exam: Vital signs BP 104/69 (BP Location: Right Arm)   Pulse 83   Temp 98.5 F (36.9 C) (Oral)   Resp 16   Ht 5' 11 (1.803 m)   Wt 88.2 kg   SpO2 97%   BMI 27.12 kg/m   General:   Alert,  Well-developed, well-nourished, pleasant and cooperative in NAD Lungs:  Clear throughout to auscultation.   Heart:  Regular rate and rhythm; no murmurs, clicks, rubs,  or gallops. Abdomen:  Soft, nontender. Mildly distended, Normal bowel sounds.   Neuro/Psych:  Normal mood and affect. A and O x 3  Inocente Hausen, MD [facility]

## 2024-04-28 NOTE — Anesthesia Preprocedure Evaluation (Signed)
 "                                  Anesthesia Evaluation  Patient identified by MRN, date of birth, ID band Patient awake    Reviewed: Allergy & Precautions, NPO status , Patient's Chart, lab work & pertinent test results, reviewed documented beta blocker date and time   Airway Mallampati: II  TM Distance: >3 FB Neck ROM: Full    Dental  (+) Poor Dentition, Missing, Chipped, Dental Advisory Given   Pulmonary Current Smoker and Patient abstained from smoking.   Pulmonary exam normal breath sounds clear to auscultation       Cardiovascular hypertension, Pt. on medications and Pt. on home beta blockers Normal cardiovascular exam Rhythm:Regular Rate:Normal     Neuro/Psych  PSYCHIATRIC DISORDERS      negative neurological ROS     GI/Hepatic ,GERD  Medicated,,(+) Cirrhosis   Esophageal Varices  substance abuse  alcohol use, Hepatitis -, Toxin Related  Chemistry         Component                Value               Date/Time                 NA                       135                 04/28/2024 0419           K                        3.4 (L)             04/28/2024 0419           CL                       105                 04/28/2024 0419           CO2                      24                  04/28/2024 0419           BUN                      <5 (L)              04/28/2024 0419           CREATININE               0.58 (L)            04/28/2024 0419           CREATININE               0.80                11/03/2021 1501             Component                Value  Date/Time                 CALCIUM                  7.0 (L)             04/28/2024 0419           ALKPHOS                  99                  04/26/2024 1442           AST                      137 (H)             04/26/2024 1442           ALT                      53 (H)              04/26/2024 1442           BILITOT                  5.6 (H)             04/26/2024 1442        UGI Bleed    Endo/Other  diabetes, Well Controlled    Renal/GU   negative genitourinary   Musculoskeletal negative musculoskeletal ROS (+)    Abdominal   Peds  Hematology  (+) Blood dyscrasia, anemia Lab Results      Component                Value               Date                      WBC                      12.4 (H)            04/27/2024                HGB                      7.5 (L)             04/28/2024                HCT                      22.1 (L)            04/28/2024                MCV                      102.4 (H)           04/27/2024                PLT                      157                 04/27/2024  Anesthesia Other Findings   Reproductive/Obstetrics                              Anesthesia Physical Anesthesia Plan  ASA: 3  Anesthesia Plan: MAC   Post-op Pain Management: Minimal or no pain anticipated   Induction: Intravenous  PONV Risk Score and Plan: 1 and Treatment may vary due to age or medical condition and Propofol  infusion  Airway Management Planned: Natural Airway and Simple Face Mask  Additional Equipment: None  Intra-op Plan:   Post-operative Plan:   Informed Consent: I have reviewed the patients History and Physical, chart, labs and discussed the procedure including the risks, benefits and alternatives for the proposed anesthesia with the patient or authorized representative who has indicated his/her understanding and acceptance.     Dental advisory given  Plan Discussed with: CRNA and Anesthesiologist  Anesthesia Plan Comments:          Anesthesia Quick Evaluation  "

## 2024-04-28 NOTE — Plan of Care (Signed)
" °  Problem: Education: Goal: Knowledge of General Education information will improve Description: Including pain rating scale, medication(s)/side effects and non-pharmacologic comfort measures Outcome: Progressing   Problem: Health Behavior/Discharge Planning: Goal: Ability to manage health-related needs will improve Outcome: Progressing   Problem: Clinical Measurements: Goal: Will remain free from infection Outcome: Progressing Goal: Diagnostic test results will improve Outcome: Progressing   Problem: Nutrition: Goal: Adequate nutrition will be maintained Outcome: Progressing   Problem: Coping: Goal: Level of anxiety will decrease Outcome: Progressing   Problem: Pain Managment: Goal: General experience of comfort will improve and/or be controlled Outcome: Progressing   Problem: Skin Integrity: Goal: Risk for impaired skin integrity will decrease Outcome: Progressing   "

## 2024-04-28 NOTE — Op Note (Addendum)
 [facility] Patient Name: Craig Andrews Procedure Date : 04/28/2024 MRN: [id number] Attending MD: Inocente Hausen , MD, [id number] Date of Birth: 23-Dec-1980 CSN: [id number] Age: 43 Admit Type: Inpatient Procedure:                Upper GI endoscopy Indications:              Acute post hemorrhagic anemia, Hematemesis, Melena,                            Follow-up of esophageal varices; EGD 04/13/2024                            with large esophageal varices present status post 6 Providers:                Inocente Hausen, MD, Ritta Debbie Alert, RN,                            Felice Sar, Technician Referring MD:              Medicines:                Monitored Anesthesia Care Complications:            No immediate complications. Estimated blood loss:                            None. Estimated Blood Loss:     Estimated blood loss: none. Procedure:                Pre-Anesthesia Assessment:                           - Prior to the procedure, a History and Physical                            was performed, and patient medications and                            allergies were reviewed. The patient's tolerance of                            previous anesthesia was also reviewed. The risks                            and benefits of the procedure and the sedation                            options and risks were discussed with the patient.                            All questions were answered, and informed consent                            was obtained. Prior Anticoagulants: The patient has  taken no anticoagulant or antiplatelet agents. ASA                            Grade Assessment: III - A patient with severe                            systemic disease. After reviewing the risks and                            benefits, the patient was deemed in satisfactory                            condition to undergo the procedure.                            After obtaining informed consent, the endoscope was                            passed under direct vision. Throughout the                            procedure, the patient's blood pressure, pulse, and                            oxygen saturations were monitored continuously. The                            [id number]-[id number] ([id number]) Olympus endoscope was introduced                            through the mouth, and advanced to the second part                            of duodenum. The upper GI endoscopy was                            accomplished without difficulty. The patient                            tolerated the procedure well. Scope In: Scope Out: Findings:      The upper third of the esophagus was normal.      Three columns of grade II-III varices with no stigmata of recent       bleeding were found in the middle third of the esophagus and in the       lower third of the esophagus. One red wale sign was present. Stigmata of       prior treatment were evident with two non-bleeding 8-10 mm post banding       ulcers in the middle and lower esophagus. No additional banding was       performed today given absence of bleeding and presence of post banding       ulcers.      Severe portal hypertensive gastropathy was found in the entire examined       stomach. No gastric varices.  The duodenal bulb and second portion of the duodenum were normal.      No active bleeding seen during today's exam Impression:               - Normal upper third of esophagus.                           - Grade II-III esophageal varices with one red wale                            sign and two non-bleeding 8-10 mm post-banding                            ulcers - suspect post-banding ulcers were the                            source of recent GI bleeding.                           - Portal hypertensive gastropathy.                           - Normal duodenal bulb and second portion of the                             duodenum.                           - No specimens collected.                           - No active bleeding seen at the time of today's                            exam Recommendation:           - Return patient to hospital ward for ongoing care.                           - In the absence of active bleeding can discontinue                            octreotide  drip                           - Continue IV PPI twice daily                           - Continue Carafate  in the hospital and after                            discharge for treatment of post banding ulcers.                           - Start Coreg  3.125 mg po BID for management of  esophageal varices.                           - Patient will need repeat EGD with primary GI in                            [place] in 2-4 weeks for reassessment of varices                            and potential banding.                           - May advance diet to 2g sodium diet.                           - Continue to monitor for bleeding, serial H/H                            after cessation of octreotide .                           - The findings and recommendations were discussed                            with the patient. Reviewed improtance of alcohol                            abstinence. Procedure Code(s):        --- Professional ---                           [phone], Esophagogastroduodenoscopy, flexible,                            transoral; diagnostic, including collection of                            specimen(s) by brushing or washing, when performed                            (separate procedure) Diagnosis Code(s):        --- Professional ---                           I85.00, Esophageal varices without bleeding                           K76.6, Portal hypertension                           K31.89, Other diseases of stomach and duodenum                           D62, Acute posthemorrhagic anemia                            K92.0, Hematemesis  K92.1, Melena (includes Hematochezia) CPT copyright 2022 [employer] Association. All rights reserved. The codes documented in this report are preliminary and upon coder review may  be revised to meet current compliance requirements. Inocente Hausen, MD 04/28/2024 1:00:54 PM This report has been signed electronically. Number of Addenda: 0

## 2024-04-29 LAB — CBC
HCT: 21.6 % — ABNORMAL LOW (ref 39.0–52.0)
Hemoglobin: 7.4 g/dL — ABNORMAL LOW (ref 13.0–17.0)
MCH: 35.7 pg — ABNORMAL HIGH (ref 26.0–34.0)
MCHC: 34.3 g/dL (ref 30.0–36.0)
MCV: 104.3 fL — ABNORMAL HIGH (ref 80.0–100.0)
Platelets: 138 K/uL — ABNORMAL LOW (ref 150–400)
RBC: 2.07 MIL/uL — ABNORMAL LOW (ref 4.22–5.81)
RDW: 18.6 % — ABNORMAL HIGH (ref 11.5–15.5)
WBC: 7.5 K/uL (ref 4.0–10.5)
nRBC: 0.4 % — ABNORMAL HIGH (ref 0.0–0.2)

## 2024-04-29 LAB — FOLATE: Folate: 12.3 ng/mL

## 2024-04-29 LAB — GLUCOSE, CAPILLARY
Glucose-Capillary: 104 mg/dL — ABNORMAL HIGH (ref 70–99)
Glucose-Capillary: 123 mg/dL — ABNORMAL HIGH (ref 70–99)
Glucose-Capillary: 125 mg/dL — ABNORMAL HIGH (ref 70–99)
Glucose-Capillary: 133 mg/dL — ABNORMAL HIGH (ref 70–99)
Glucose-Capillary: 134 mg/dL — ABNORMAL HIGH (ref 70–99)
Glucose-Capillary: 219 mg/dL — ABNORMAL HIGH (ref 70–99)

## 2024-04-29 LAB — COMPREHENSIVE METABOLIC PANEL WITH GFR
ALT: 53 U/L — ABNORMAL HIGH (ref 0–44)
AST: 138 U/L — ABNORMAL HIGH (ref 15–41)
Albumin: 2.4 g/dL — ABNORMAL LOW (ref 3.5–5.0)
Alkaline Phosphatase: 100 U/L (ref 38–126)
Anion gap: 5 (ref 5–15)
BUN: <5 mg/dL — ABNORMAL LOW (ref 6–20)
CO2: 26 mmol/L (ref 22–32)
Calcium: 7.4 mg/dL — ABNORMAL LOW (ref 8.9–10.3)
Chloride: 106 mmol/L (ref 98–111)
Creatinine, Ser: 0.72 mg/dL (ref 0.61–1.24)
GFR, Estimated: >60 mL/min
Glucose, Bld: 128 mg/dL — ABNORMAL HIGH (ref 70–99)
Potassium: 3.9 mmol/L (ref 3.5–5.1)
Sodium: 136 mmol/L (ref 135–145)
Total Bilirubin: 4.4 mg/dL — ABNORMAL HIGH (ref 0.0–1.2)
Total Protein: 6 g/dL — ABNORMAL LOW (ref 6.5–8.1)

## 2024-04-29 LAB — IRON AND TIBC
Iron: 24 ug/dL — ABNORMAL LOW (ref 45–182)
Saturation Ratios: 12 % — ABNORMAL LOW (ref 17.9–39.5)
TIBC: 209 ug/dL — ABNORMAL LOW (ref 250–450)
UIBC: 184 ug/dL

## 2024-04-29 LAB — PREPARE RBC (CROSSMATCH)

## 2024-04-29 LAB — VITAMIN B12: Vitamin B-12: 2839 pg/mL — ABNORMAL HIGH (ref 180–914)

## 2024-04-29 LAB — PROTIME-INR
INR: 1.3 — ABNORMAL HIGH (ref 0.8–1.2)
Prothrombin Time: 16.5 s — ABNORMAL HIGH (ref 11.4–15.2)

## 2024-04-29 LAB — RETICULOCYTES
Immature Retic Fract: 42.8 % — ABNORMAL HIGH (ref 2.3–15.9)
RBC.: 2.09 MIL/uL — ABNORMAL LOW (ref 4.22–5.81)
Retic Count, Absolute: 202.5 K/uL — ABNORMAL HIGH (ref 19.0–186.0)
Retic Ct Pct: 9.7 % — ABNORMAL HIGH (ref 0.4–3.1)

## 2024-04-29 LAB — FERRITIN: Ferritin: 266 ng/mL (ref 24–336)

## 2024-04-29 MED ORDER — FERROUS SULFATE 325 (65 FE) MG PO TABS
325.0000 mg | ORAL_TABLET | Freq: Two times a day (BID) | ORAL | Status: DC
  Administered 2024-04-29 – 2024-04-30 (×2): 325 mg via ORAL
  Filled 2024-04-29 (×2): qty 1

## 2024-04-29 MED ORDER — SODIUM CHLORIDE 0.9% IV SOLUTION: Freq: Once | INTRAVENOUS | Status: AC

## 2024-04-29 NOTE — Plan of Care (Signed)

## 2024-04-29 NOTE — Progress Notes (Signed)
 " PROGRESS NOTE Craig Andrews    DOB: 06-Apr-1981, 43 y.o.  FMW:[id number]    Code Status: Full Code   DOA: 04/26/2024   LOS: 3  Brief hospital course  Craig Andrews is a 43 y.o. male with a PMH significant for alcoholic liver cirrhosis, SBP, hypertension with esophageal varices. Recently hospitalized 12/9 to 12/12 for DKA, and GI bleed.  He was placed on insulin  drip.  Underwent EGD 12/11-grade 3 and large (> 5mm) esophageal varices.  Incompletely eradicated. Banded. Portal hypertensive gastropathy. Patient presented to the ED with complaints of multiple episodes of bloody stools, and 1 episode of bloody emesis.  He took a liver detox medicine from [employer] and ibuprofen  prior to these episodes.    ED Course: Temperature 98.  Heart rate 92-125.  Respiratory rate 15-33.  Blood pressure systolic 103-127.  O2 sats greater than 96% on room air. Hemoglobin 7.1, baseline 13-14.  IV ceftriaxone  1 g given, octreotide , Protonix  80 mg x 1 given. 500 bolus given, 2 unit PRBC ordered for transfusion.  04/29/2024 -stable. GI following. EGD today  Assessment & Plan   Acute GI bleed with hematemesis and hematochezia.  S/p esophageal variceal banding esophageal varices 12/11-   He took OTC NSAID and liver detox med that may have contributed to re-bleed. So far he has received 2 units of packed RBC getting third unit on 04/29/2024, do not think he is actively bleeding anymore, placed on oral iron and folic acid  as well, has been seen by GI.  Initially was on octreotide  drip, IV PPI, continue PPI and Carafate , total 5 days of IV antibiotics.  Continue Coreg .  Monitor CBC.  Postdischarge follow-up with GI in [place] within 2 weeks of discharge for repeat EGD to evaluate varices.   Lactic acidosis   In the setting of GI bleed, diarrhea, and liver disease.  Due to hypoperfusion.  Stable.   Alcoholic liver cirrhosis, portal hypertension, elevated liver enzymes-  Continue thiamine  and folic acid , strictly  counseled to abstain from alcohol, on Coreg , Xifaxan , LFTs improving, outpatient follow-up with GI in [place] postdischarge.  Pulmonary nodule- A 15 mm nodule in the superior segment of the right lower lobe.  Needs repeat CT in 3 months along with pulmonary follow-up with 1-2 weeks postdischarge, defer this to PCP  Diabetes mellitus with hyperglycemia - Hold metformin  - SSI- with meals    Lab Results  Component Value Date   HGBA1C 7.3 (H) 04/11/2024   CBG (last 3)  Recent Labs    04/28/24 2314 04/29/24 0405 04/29/24 0753  GLUCAP 136* 123* 134*    Body mass index is 27.12 kg/m.  VTE ppx: SCDs Start: 04/27/24 9661  Diet:     Diet   Diet Heart Room service appropriate? Yes; Fluid consistency: Thin   Consultants: GI  Subjective 04/29/2024    Patient in bed, appears comfortable, denies any headache, no fever, no chest pain or pressure, no shortness of breath , no abdominal pain. No new focal weakness.    Objective  Blood pressure 118/75, pulse 96, temperature 97.8 F (36.6 C), temperature source Oral, resp. rate 20, height 5' 11 (1.803 m), weight 88.2 kg, SpO2 98%.  Intake/Output Summary (Last 24 hours) at 04/29/2024 1051 Last data filed at 04/29/2024 0930 Gross per 24 hour  Intake 728 ml  Output 650 ml  Net 78 ml   Filed Weights   04/26/24 1503 04/27/24 0306  Weight: 88.5 kg 88.2 kg     Physical Exam:   Awake  Alert, No new F.N deficits, Normal affect [place].AT,PERRAL Supple Neck, No JVD,   Symmetrical Chest wall movement, Good air movement bilaterally, CTAB RRR,No Gallops, Rubs or new Murmurs,  +ve B.Sounds, Abd Soft, No tenderness,   No Cyanosis, Clubbing or edema    Data Review:   Patient Lines/Drains/Airways Status     Active Line/Drains/Airways     Name Placement date Placement time Site Days   Peripheral IV 04/26/24 20 G 1 Anterior;Right Forearm 04/26/24  1528  Forearm  3   Peripheral IV 04/28/24 18 G Left Wrist 04/28/24  1239  Wrist  1              Inpatient Medications  Scheduled Meds:  carvedilol   3.125 mg Oral BID WC   ferrous sulfate   325 mg Oral BID WC   folic acid   1 mg Oral Daily   insulin  aspart  0-9 Units Subcutaneous Q4H   multivitamin with minerals  1 tablet Oral Daily   pantoprazole  (PROTONIX ) IV  40 mg Intravenous Q12H   rifaximin   550 mg Oral BID   sucralfate   1 g Oral Q6H   thiamine   100 mg Oral Daily   Or   thiamine   100 mg Intravenous Daily   Continuous Infusions:  cefTRIAXone  (ROCEPHIN )  IV 2 g (04/28/24 1528)   PRN Meds:.acetaminophen  **OR** acetaminophen , lactulose , ondansetron  **OR** ondansetron  (ZOFRAN ) IV  DVT Prophylaxis  SCDs Start: 04/27/24 0338   Recent Labs  Lab 04/27/24 0042 04/27/24 0405 04/27/24 1308 04/27/24 2025 04/28/24 1025 04/29/24 0445  WBC 17.6* 16.7* 13.6* 12.4*  --  7.5  HGB 7.9* 8.2* 7.2* 7.4* 7.5* 7.4*  HCT 22.8* 23.5* 20.6* 21.7* 22.1* 21.6*  PLT 190 193 148* 157  --  138*  MCV 101.3* 99.2 101.0* 102.4*  --  104.3*  MCH 35.1* 34.6* 35.3* 34.9*  --  35.7*  MCHC 34.6 34.9 35.0 34.1  --  34.3  RDW 17.7* 17.9* 18.0* 18.0*  --  18.6*  LYMPHSABS 2.7  --   --   --   --   --   MONOABS 2.3*  --   --   --   --   --   EOSABS 0.4  --   --   --   --   --   BASOSABS 0.1  --   --   --   --   --     Recent Labs  Lab 04/26/24 1442 04/26/24 1606 04/26/24 1820 04/27/24 0042 04/27/24 1308 04/28/24 0419 04/29/24 0445  NA 127*  --   --   --  129* 135 136  K 4.2  --   --   --  3.8 3.4* 3.9  CL 95*  --   --   --  101 105 106  CO2 15*  --   --   --  21* 24 26  ANIONGAP 17*  --   --   --  8 7 5   GLUCOSE 364*  --   --   --  227* 99 128*  BUN 8  --   --   --  6 <5* <5*  CREATININE 0.58*  --   --   --  0.65 0.58* 0.72  AST 137*  --   --   --   --   --  138*  ALT 53*  --   --   --   --   --  53*  ALKPHOS 99  --   --   --   --   --  100  BILITOT 5.6*  --   --   --   --   --  4.4*  ALBUMIN  2.7*  --   --   --   --   --  2.4*  LATICACIDVEN  --  5.0* 4.6* 2.8*  --   --    --   INR  --  1.2  --   --   --   --  1.3*  CALCIUM 7.4*  --   --   --  6.8* 7.0* 7.4*      Recent Labs  Lab 04/26/24 1442 04/26/24 1606 04/26/24 1820 04/27/24 0042 04/27/24 1308 04/28/24 0419 04/29/24 0445  LATICACIDVEN  --  5.0* 4.6* 2.8*  --   --   --   INR  --  1.2  --   --   --   --  1.3*  CALCIUM 7.4*  --   --   --  6.8* 7.0* 7.4*    --------------------------------------------------------------------------------------------------------------- No results found for: CHOL, HDL, LDLCALC, LDLDIRECT, TRIG, CHOLHDL  Lab Results  Component Value Date   HGBA1C 7.3 (H) 04/11/2024   No results for input(s): TSH, T4TOTAL, FREET4, T3FREE, THYROIDAB in the last 72 hours. Recent Labs    04/29/24 0445  VITAMINB12 2,839*  FOLATE 12.3  FERRITIN 266  TIBC 209*  IRON 24*  RETICCTPCT 9.7*   ------------------------------------------------------------------------------------------------------------------ Cardiac Enzymes No results for input(s): CKMB, TROPONINI, MYOGLOBIN in the last 168 hours.  Invalid input(s): CK  Micro Results Recent Results (from the past 240 hours)  Blood culture (routine x 2)     Status: None (Preliminary result)   Collection Time: 04/26/24  4:06 PM   Specimen: BLOOD  Result Value Ref Range Status   Specimen Description BLOOD LEFT ANTECUBITAL  Final   Special Requests   Final    BOTTLES DRAWN AEROBIC AND ANAEROBIC Blood Culture adequate volume   Culture   Final    NO GROWTH 2 DAYS Performed at [facility], [address] [place], [place] [address]    Report Status PENDING  Incomplete  Blood culture (routine x 2)     Status: None (Preliminary result)   Collection Time: 04/26/24  4:24 PM   Specimen: BLOOD  Result Value Ref Range Status   Specimen Description BLOOD LEFT ANTECUBITAL  Final   Special Requests   Final    BOTTLES DRAWN AEROBIC AND ANAEROBIC Blood Culture results may not be optimal due to an inadequate  volume of blood received in culture bottles   Culture   Final    NO GROWTH 2 DAYS Performed at [facility], [address], [place], [place] [address]    Report Status PENDING  Incomplete    Radiology Reports  No results found.    Signature  -   Lavada Stank M.D on 04/29/2024 at 10:52 AM   -  To page go to [email or web address]      "

## 2024-04-29 NOTE — Progress Notes (Signed)
 "   Inpatient Progress Note     Patient Profile/Chief Complaint  43 year old gentleman with history of decompensated alcoholic cirrhosis complicated by variceal bleeding in 2022 and recent variceal bleed 04/13/2024 status post 6 bands at [facility] transferred to [facility] with hematemesis, melena and anemia hgb 7.1.   EGD 04/28/2024 -3 columns of grade II-III varices in the middle and lower third of the esophagus, 1 red wale sign present.  Two 8-10 mm post banding ulcers that were nonbleeding, severe portal hypertensive gastropathy   Interval History   - No acute events overnight - Octreotide  drip discontinued yesterday - Hemoglobin this morning 7.4 (stable) -hospitalist ordered 1 unit PRBC to aim for target hemoglobin closer to 8    Objective   Vital signs in last 24 hours: Temp:  [97.7 F (36.5 C)-98.8 F (37.1 C)] 98.1 F (36.7 C) (12/27 1553) Pulse Rate:  [78-84] 84 (12/27 0645) Resp:  [18] 18 (12/27 0920) BP: (95-108)/(58-75) 107/71 (12/27 0754) SpO2:  [91 %-94 %] 93 % (12/27 0645) Last BM Date : 04/29/24 General:    Resting in bed in no distress Heart:  Regular rate and rhythm; no murmurs Lungs: Respirations even and unlabored, lungs CTA bilaterally Abdomen:  Soft, nontender and nondistended. Normal bowel sounds. Extremities:  Without edema. Neurologic:  Alert and oriented,  grossly normal neurologically. Psych:  Cooperative. Normal mood and affect.  Intake/Output from previous day: 12/26 0701 - 12/27 0700 In: 250 [I.V.:250] Out: 250 [Urine:250] Intake/Output this shift: Total I/O In: 778 [P.O.:300; I.V.:150; Blood:328] Out: 400 [Urine:400]  Lab Results: Recent Labs    04/27/24 1308 04/27/24 2025 04/28/24 1025 04/29/24 0445  WBC 13.6* 12.4*  --  7.5  HGB 7.2* 7.4* 7.5* 7.4*  HCT 20.6* 21.7* 22.1* 21.6*  PLT 148* 157  --  138*   BMET Recent Labs    04/27/24 1308 04/28/24 0419 04/29/24 0445  NA 129* 135 136  K 3.8 3.4* 3.9  CL 101 105 106  CO2  21* 24 26  GLUCOSE 227* 99 128*  BUN 6 <5* <5*  CREATININE 0.65 0.58* 0.72  CALCIUM 6.8* 7.0* 7.4*   LFT Recent Labs    04/29/24 0445  PROT 6.0*  ALBUMIN  2.4*  AST 138*  ALT 53*  ALKPHOS 100  BILITOT 4.4*   PT/INR Recent Labs    04/29/24 0445  LABPROT 16.5*  INR 1.3*    Studies/Results: No results found.  Endoscopic Studies: EGD 04/28/2024 -3 columns of grade II-III varices in the middle and lower third of the esophagus, 1 red wale sign present.  Two 8-10 mm post banding ulcers that were nonbleeding, severe portal hypertensive gastropathy   Clinical Impression   43 year old gentleman with history of decompensated alcoholic cirrhosis complicated by variceal bleeding in 2022 and recent variceal bleed 04/13/2024 status post 6 bands at [facility] transferred to [facility] with hematemesis, melena and anemia hgb 7.1.   EGD 04/28/2024 -3 columns of grade II-III varices in the middle and lower third of the esophagus, 1 red wale sign present.  Two 8-10 mm post banding ulcers that were nonbleeding, severe portal hypertensive gastropathy.   Source of recent bleeding most likely post banding ulcers.  Banding was not performed during yesterday's EGD to avoid disruption of ulcers.  Octreotide  was discontinued 04/28/2024.  Continues on IV Protonix  and Carafate  1 g p.o. 4 times daily.  Hemoglobin this morning is stable at 7.4.  He denies overt bleeding.  1 unit of PRBCs was administered to  aim for target hemoglobin closer to 8.   Plan  Continue to monitor for overt GI bleeding Follow serial H&H Continue empiric Rocephin  Continue Protonix  40 mg IV twice daily Continue Carafate  1 g p.o. 4 times daily Continue Coreg  3.125 mg p.o. twice daily Continue rifaximin  550 mg p.o. twice daily Continue iron supplementation Continue thiamine  When stable for discharge will coordinate follow-up with Dr. Eartha in [place] who follows patient for hepatology issues.  Anticipate need for repeat  EGD in 2 to 4 weeks for reevaluation of varices and banding.    LOS: 3 days   Inocente CHRISTELLA Hausen  04/29/2024, 5:34 PM  Inocente Hausen, MD [facility]  "

## 2024-04-29 NOTE — Plan of Care (Signed)
 Patient is progressing towards goals of care.     Problem: Education: Goal: Knowledge of General Education information will improve Description: Including pain rating scale, medication(s)/side effects and non-pharmacologic comfort measures Outcome: Progressing   Problem: Health Behavior/Discharge Planning: Goal: Ability to manage health-related needs will improve Outcome: Progressing   Problem: Clinical Measurements: Goal: Ability to maintain clinical measurements within normal limits will improve Outcome: Progressing Goal: Will remain free from infection Outcome: Progressing Goal: Diagnostic test results will improve Outcome: Progressing Goal: Respiratory complications will improve Outcome: Progressing Goal: Cardiovascular complication will be avoided Outcome: Progressing   Problem: Activity: Goal: Risk for activity intolerance will decrease Outcome: Progressing   Problem: Nutrition: Goal: Adequate nutrition will be maintained Outcome: Progressing   Problem: Coping: Goal: Level of anxiety will decrease Outcome: Progressing   Problem: Elimination: Goal: Will not experience complications related to bowel motility Outcome: Progressing Goal: Will not experience complications related to urinary retention Outcome: Progressing   Problem: Pain Managment: Goal: General experience of comfort will improve and/or be controlled Outcome: Progressing   Problem: Safety: Goal: Ability to remain free from injury will improve Outcome: Progressing   Problem: Skin Integrity: Goal: Risk for impaired skin integrity will decrease Outcome: Progressing   Problem: Education: Goal: Ability to describe self-care measures that may prevent or decrease complications (Diabetes Survival Skills Education) will improve Outcome: Progressing Goal: Individualized Educational Video(s) Outcome: Progressing   Problem: Coping: Goal: Ability to adjust to condition or change in health will  improve Outcome: Progressing   Problem: Fluid Volume: Goal: Ability to maintain a balanced intake and output will improve Outcome: Progressing   Problem: Health Behavior/Discharge Planning: Goal: Ability to identify and utilize available resources and services will improve Outcome: Progressing Goal: Ability to manage health-related needs will improve Outcome: Progressing   Problem: Metabolic: Goal: Ability to maintain appropriate glucose levels will improve Outcome: Progressing   Problem: Nutritional: Goal: Maintenance of adequate nutrition will improve Outcome: Progressing Goal: Progress toward achieving an optimal weight will improve Outcome: Progressing   Problem: Skin Integrity: Goal: Risk for impaired skin integrity will decrease Outcome: Progressing   Problem: Tissue Perfusion: Goal: Adequacy of tissue perfusion will improve Outcome: Progressing

## 2024-04-30 ENCOUNTER — Encounter (HOSPITAL_COMMUNITY): Payer: Self-pay | Admitting: Pediatrics

## 2024-04-30 ENCOUNTER — Other Ambulatory Visit (HOSPITAL_COMMUNITY): Payer: Self-pay

## 2024-04-30 LAB — CBC WITH DIFFERENTIAL/PLATELET
Abs Immature Granulocytes: 0.03 K/uL (ref 0.00–0.07)
Basophils Absolute: 0.1 K/uL (ref 0.0–0.1)
Basophils Relative: 1 %
Eosinophils Absolute: 0.4 K/uL (ref 0.0–0.5)
Eosinophils Relative: 5 %
HCT: 24 % — ABNORMAL LOW (ref 39.0–52.0)
Hemoglobin: 8.3 g/dL — ABNORMAL LOW (ref 13.0–17.0)
Immature Granulocytes: 0 %
Lymphocytes Relative: 18 %
Lymphs Abs: 1.2 K/uL (ref 0.7–4.0)
MCH: 35.6 pg — ABNORMAL HIGH (ref 26.0–34.0)
MCHC: 34.6 g/dL (ref 30.0–36.0)
MCV: 103 fL — ABNORMAL HIGH (ref 80.0–100.0)
Monocytes Absolute: 0.7 K/uL (ref 0.1–1.0)
Monocytes Relative: 10 %
Neutro Abs: 4.4 K/uL (ref 1.7–7.7)
Neutrophils Relative %: 66 %
Platelets: 127 K/uL — ABNORMAL LOW (ref 150–400)
RBC: 2.33 MIL/uL — ABNORMAL LOW (ref 4.22–5.81)
RDW: 19.9 % — ABNORMAL HIGH (ref 11.5–15.5)
WBC: 6.8 K/uL (ref 4.0–10.5)
nRBC: 0.3 % — ABNORMAL HIGH (ref 0.0–0.2)

## 2024-04-30 LAB — COMPREHENSIVE METABOLIC PANEL WITH GFR
ALT: 49 U/L — ABNORMAL HIGH (ref 0–44)
AST: 132 U/L — ABNORMAL HIGH (ref 15–41)
Albumin: 2.3 g/dL — ABNORMAL LOW (ref 3.5–5.0)
Alkaline Phosphatase: 127 U/L — ABNORMAL HIGH (ref 38–126)
Anion gap: 8 (ref 5–15)
BUN: <5 mg/dL — ABNORMAL LOW (ref 6–20)
CO2: 22 mmol/L (ref 22–32)
Calcium: 7.2 mg/dL — ABNORMAL LOW (ref 8.9–10.3)
Chloride: 106 mmol/L (ref 98–111)
Creatinine, Ser: 0.58 mg/dL — ABNORMAL LOW (ref 0.61–1.24)
GFR, Estimated: >60 mL/min
Glucose, Bld: 101 mg/dL — ABNORMAL HIGH (ref 70–99)
Potassium: 3.2 mmol/L — ABNORMAL LOW (ref 3.5–5.1)
Sodium: 136 mmol/L (ref 135–145)
Total Bilirubin: 4.2 mg/dL — ABNORMAL HIGH (ref 0.0–1.2)
Total Protein: 5.9 g/dL — ABNORMAL LOW (ref 6.5–8.1)

## 2024-04-30 LAB — TYPE AND SCREEN
ABO/RH(D): O POS
Antibody Screen: NEGATIVE
Unit division: 0

## 2024-04-30 LAB — MAGNESIUM: Magnesium: 1.9 mg/dL (ref 1.7–2.4)

## 2024-04-30 LAB — GLUCOSE, CAPILLARY
Glucose-Capillary: 109 mg/dL — ABNORMAL HIGH (ref 70–99)
Glucose-Capillary: 100 mg/dL — ABNORMAL HIGH (ref 70–99)

## 2024-04-30 LAB — BPAM RBC
Blood Product Expiration Date: 202601242359
ISSUE DATE / TIME: 202512270632
Unit Type and Rh: 5100

## 2024-04-30 MED ORDER — LACTULOSE 10 GM/15ML PO SOLN
10.0000 g | Freq: Two times a day (BID) | ORAL | 0 refills | Status: AC | PRN
  Filled 2024-04-30: qty 473, 16d supply, fill #0

## 2024-04-30 MED ORDER — PANTOPRAZOLE SODIUM 40 MG PO TBEC
40.0000 mg | DELAYED_RELEASE_TABLET | Freq: Two times a day (BID) | ORAL | 2 refills | Status: AC
  Filled 2024-04-30: qty 60, 30d supply, fill #0

## 2024-04-30 MED ORDER — POTASSIUM CHLORIDE CRYS ER 20 MEQ PO TBCR
40.0000 meq | EXTENDED_RELEASE_TABLET | Freq: Once | ORAL | Status: AC
  Administered 2024-04-30: 40 meq via ORAL
  Filled 2024-04-30: qty 2

## 2024-04-30 MED ORDER — RIFAXIMIN 550 MG PO TABS
550.0000 mg | ORAL_TABLET | Freq: Two times a day (BID) | ORAL | 0 refills | Status: AC
  Filled 2024-04-30: qty 60, 30d supply, fill #0

## 2024-04-30 MED ORDER — THIAMINE HCL 100 MG PO TABS
100.0000 mg | ORAL_TABLET | Freq: Every day | ORAL | 0 refills | Status: AC
  Filled 2024-04-30: qty 30, 30d supply, fill #0

## 2024-04-30 MED ORDER — SUCRALFATE 1 GM/10ML PO SUSP
1.0000 g | Freq: Four times a day (QID) | ORAL | 0 refills | Status: AC
  Filled 2024-04-30: qty 560, 14d supply, fill #0

## 2024-04-30 MED ORDER — FERROUS SULFATE 325 (65 FE) MG PO TABS
325.0000 mg | ORAL_TABLET | Freq: Two times a day (BID) | ORAL | 0 refills | Status: AC
  Filled 2024-04-30: qty 30, 15d supply, fill #0

## 2024-04-30 NOTE — Care Management (Signed)
 MATCH MEDICATION ASSISTANCE CARD Pharmacies please call: [phone] for claim processing assistance.  Rx BIN: [id number] Rx Group: [id number] Rx PCN: [id number] Relationship Code: 1 Person Code: 01  Patient ID (MRN): [id number]   Patient Name: Craig Andrews     Patient DOB: [birth year]    Discharge Date: 04/30/2024    Expiration Date:05/09/2023 (must be filled within 7 days of discharge)

## 2024-04-30 NOTE — Progress Notes (Signed)
 Discharge   Patient expressed verbal understanding of discharge POC.   Patient given time to ask any questions.  Additional education included in AVS.  Alert oriented in good spirits.   Tele and 2 PIV removed.  Pressure dressings intact. CCMD/ Tiffany.  Ride at bedside.   Needs TOC meds in process.   Transferring to [facility].

## 2024-04-30 NOTE — Discharge Summary (Signed)
 "                                                                                                                                                                               Discharge summary note.  Craig Andrews FMW:[id number] DOB: 1981-03-22 DOA: 04/26/2024  PCP: Harl Jayson CROME, MD (Inactive)  Admit date: 04/26/2024  Discharge date: 04/30/2024  Admitted From: Home   Disposition:  Home   Recommendations for Outpatient Follow-up:   Follow up with PCP in 1-2 weeks  PCP Please obtain BMP/CBC, 2 view CXR in 1week,  (see Discharge instructions)   PCP Please follow up on the following pending results: Needs repeat EGD within 2 weeks for esophageal varices evaluation.  Needs a repeat CT chest noncontrast in 4 to [redacted] weeks along with outpatient pulmonary follow-up within a month.   Home Health: None   Equipment/Devices: None  Consultations: GI Discharge Condition: Stable    CODE STATUS: Full    Diet Recommendation: Heart Healthy     Chief Complaint  Patient presents with   Emesis     Brief history of present illness from the day of admission and additional interim summary    43 y.o. male with a PMH significant for alcoholic liver cirrhosis, SBP, hypertension with esophageal varices. Recently hospitalized 12/9 to 12/12 for DKA, and GI bleed.  He was placed on insulin  drip.  Underwent EGD 12/11-grade 3 and large (> 5mm) esophageal varices.  Incompletely eradicated. Banded. Portal hypertensive gastropathy. Patient presented to the ED with complaints of multiple episodes of bloody stools, and 1 episode of bloody emesis.  He took a liver detox medicine from [employer] and ibuprofen  prior to these episodes.                                                                  Hospital Course   Acute GI bleed with hematemesis and hematochezia.  S/p esophageal variceal banding esophageal varices 12/11-   He took OTC NSAID and liver detox med that may have contributed to re-bleed. So far  he has received 2 units of packed RBC getting third unit on 04/29/2024, do not think he is actively bleeding anymore, placed on oral iron and folic acid  as well, has been seen by GI.  Initially was on octreotide  drip, and IV PPI, will be transition to oral PPI twice daily and 2 weeks of Carafate , continue Coreg  upon discharge, finished total 5 days of IV antibiotics for SBP  prophylaxis.  He is currently completely symptom-free and eager to go home. Stable posttransfusion H&H, postdischarge follow-up with GI in [place] within 2 weeks of discharge for repeat EGD to evaluate varices.   Lactic acidosis   In the setting of GI bleed, diarrhea, and liver disease.  Due to hypoperfusion.  Stable.   Alcoholic liver cirrhosis, portal hypertension, elevated liver enzymes-  Continue thiamine  and folic acid , strictly counseled to abstain from alcohol, on Coreg , Xifaxan , LFTs improving, outpatient follow-up with GI in [place] postdischarge within 1 to 2 weeks for repeat EGD, discussed with GI here on 04/29/2024.   Pulmonary nodule- A 15 mm nodule in the superior segment of the right lower lobe.  Needs repeat CT in 3 months along with pulmonary follow-up with 1-2 weeks postdischarge, defer this to PCP   Diabetes mellitus with hyperglycemia - Continue home regimen and follow-up with PCP  Hypokalemia.  Replaced.  Discharge diagnosis     Principal Problem:   Upper GI bleed Active Problems:   Alcoholic cirrhosis (HCC)   Secondary esophageal varices with bleeding (HCC)   Portal hypertensive gastropathy (HCC)   Hyponatremia    Discharge instructions    Discharge Instructions     Discharge instructions   Complete by: As directed    ollow with Primary MD Harl Jayson CROME, MD (Inactive) in 2 to 3 days, you must see your gastroenterologist in [place] within a week and get another EGD done within 2 weeks.  Get CBC, CMP, Magnesium  -  checked next visit with your primary MD    Activity: As  tolerated with Full fall precautions use walker/cane & assistance as needed  Disposition Home    Diet: Heart Healthy    Special Instructions: If you have smoked or chewed Tobacco  in the last 2 yrs please stop smoking, stop any regular Alcohol  and or any Recreational drug use.  On your next visit with your primary care physician please Get Medicines reviewed and adjusted.  Please request your Prim.MD to go over all Hospital Tests and Procedure/Radiological results at the follow up, please get all Hospital records sent to your Prim MD by signing hospital release before you go home.  If you experience worsening of your admission symptoms, develop shortness of breath, life threatening emergency, suicidal or homicidal thoughts you must seek medical attention immediately by calling 911 or calling your MD immediately  if symptoms less severe.  You Must read complete instructions/literature along with all the possible adverse reactions/side effects for all the Medicines you take and that have been prescribed to you. Take any new Medicines after you have completely understood and accpet all the possible adverse reactions/side effects.   Do not drive when taking Pain medications.  Do not take more than prescribed Pain, Sleep and Anxiety Medications  Wear Seat belts while driving.   Increase activity slowly   Complete by: As directed        Discharge Medications   Allergies as of 04/30/2024   No Known Allergies      Medication List     STOP taking these medications    ibuprofen  200 MG tablet Commonly known as: ADVIL    omeprazole  20 MG capsule Commonly known as: PRILOSEC        TAKE these medications    Blood Glucose Monitoring Suppl Devi 1 each by Does not apply route in the morning, at noon, and at bedtime. May substitute to any manufacturer covered by patient's insurance.   carvedilol  3.125 MG tablet Commonly  known as: COREG  Take 1 tablet (3.125 mg total) by mouth 2 (two)  times daily with a meal.   cetirizine 10 MG tablet Commonly known as: ZYRTEC Take 10 mg by mouth daily as needed for allergies.   ferrous sulfate  325 (65 FE) MG tablet Take 1 tablet (325 mg total) by mouth 2 (two) times daily with a meal.   folic acid  1 MG tablet Commonly known as: FOLVITE  Take 1 tablet (1 mg total) by mouth daily.   lactulose  10 GM/15ML solution Commonly known as: CHRONULAC  Take 15 mLs (10 g total) by mouth 2 (two) times daily as needed for mild constipation. What changed: See the new instructions.   metFORMIN  500 MG tablet Commonly known as: GLUCOPHAGE  Take 1 tablet (500 mg total) by mouth 2 (two) times daily with a meal.   multivitamin with minerals Tabs tablet Take 1 tablet by mouth daily.   pantoprazole  40 MG tablet Commonly known as: PROTONIX  Take 1 tablet (40 mg total) by mouth 2 (two) times daily.   rifaximin  550 MG Tabs tablet Commonly known as: XIFAXAN  Take 1 tablet (550 mg total) by mouth 2 (two) times daily.   sucralfate  1 GM/10ML suspension Commonly known as: CARAFATE  Take 10 mLs (1 g total) by mouth every 6 (six) hours.   thiamine  100 MG tablet Commonly known as: Vitamin B-1 Take 1 tablet (100 mg total) by mouth daily.         Follow-up Information     Harl Jayson CROME, MD. Schedule an appointment as soon as possible for a visit in 2 day(s).   Specialty: Family Medicine Why: Also follow-up with your gastroenterologist in [place] within 7 days of discharge and scheduled for another EGD within 2 weeks. Contact information: [address] [place] [address] [phone]                 Major procedures and Radiology Reports - PLEASE review detailed and final reports thoroughly  -     CT Angio Chest PE W and/or Wo Contrast Result Date: 04/26/2024 CLINICAL DATA:  Chest and abdominal pain. Concern for pulmonary embolism. Cirrhosis with bloody emesis. EXAM: CT ANGIOGRAPHY CHEST CT ABDOMEN AND PELVIS WITH CONTRAST  TECHNIQUE: Multidetector CT imaging of the chest was performed using the standard protocol during bolus administration of intravenous contrast. Multiplanar CT image reconstructions and MIPs were obtained to evaluate the vascular anatomy. Multidetector CT imaging of the abdomen and pelvis was performed using the standard protocol during bolus administration of intravenous contrast. RADIATION DOSE REDUCTION: This exam was performed according to the departmental dose-optimization program which includes automated exposure control, adjustment of the mA and/or kV according to patient size and/or use of iterative reconstruction technique. CONTRAST:  OMNIPAQUE  IOHEXOL  350 MG/ML SOLN COMPARISON:  Chest radiograph dated 04/26/2024 and abdominal CT dated 04/11/2024. FINDINGS: CTA CHEST FINDINGS Cardiovascular: There is no cardiomegaly or pericardial effusion. The thoracic aorta is unremarkable. The origins of the great vessels of the aortic arch are patent. No pulmonary artery embolus identified. Mediastinum/Nodes: No hilar or mediastinal adenopathy. The esophagus is grossly unremarkable. No mediastinal fluid collection. Lungs/Pleura: There is a 15 mm nodule in the superior segment of the right lower lobe. No focal consolidation, pleural effusion or pneumothorax. The central airways are patent. Musculoskeletal: No acute osseous pathology. Old appearing fracture of the anterior left 4th-7th ribs. Review of the MIP images confirms the above findings. CT ABDOMEN and PELVIS FINDINGS No intra-abdominal free air.  Small ascites. Hepatobiliary: Cirrhosis. No  biliary dilatation. The gallbladder is unremarkable. Pancreas: Unremarkable. No pancreatic ductal dilatation or surrounding inflammatory changes. Spleen: Normal in size without focal abnormality. Adrenals/Urinary Tract: The adrenal glands, kidneys, visualized ureters, and urinary bladder unremarkable. Stomach/Bowel: There is no bowel obstruction or active inflammation. The  appendix is normal. Vascular/Lymphatic: Mild aortoiliac atherosclerotic disease. The IVC is unremarkable. No portal gas. Para soft Jewel of versus and recanalization of the umbilical vein in keeping with portal hypertension. There is no adenopathy. Reproductive: The prostate and seminal vesicles are grossly unremarkable. Other: None Musculoskeletal: No acute or significant osseous findings. Review of the MIP images confirms the above findings. IMPRESSION: 1. No acute intrathoracic, abdominal, or pelvic pathology. No CT evidence of pulmonary artery embolus. 2. A 15 mm nodule in the superior segment of the right lower lobe. Consider one of the following in 3 months for both low-risk and high-risk individuals: (a) repeat chest CT, (b) follow-up PET-CT, or (c) tissue sampling. This recommendation follows the consensus statement: Guidelines for Management of Incidental Pulmonary Nodules Detected on CT Images: From the Fleischner Society 2017; Radiology 2017; 284:228-243. 3. Cirrhosis with small ascites and evidence of portal hypertension. 4. No bowel obstruction. Normal appendix. 5.  Aortic Atherosclerosis (ICD10-I70.0). Electronically Signed   By: Vanetta Chou M.D.   On: 04/26/2024 18:10   CT ABDOMEN PELVIS W CONTRAST Result Date: 04/26/2024 CLINICAL DATA:  Chest and abdominal pain. Concern for pulmonary embolism. Cirrhosis with bloody emesis. EXAM: CT ANGIOGRAPHY CHEST CT ABDOMEN AND PELVIS WITH CONTRAST TECHNIQUE: Multidetector CT imaging of the chest was performed using the standard protocol during bolus administration of intravenous contrast. Multiplanar CT image reconstructions and MIPs were obtained to evaluate the vascular anatomy. Multidetector CT imaging of the abdomen and pelvis was performed using the standard protocol during bolus administration of intravenous contrast. RADIATION DOSE REDUCTION: This exam was performed according to the departmental dose-optimization program which includes automated  exposure control, adjustment of the mA and/or kV according to patient size and/or use of iterative reconstruction technique. CONTRAST:  OMNIPAQUE  IOHEXOL  350 MG/ML SOLN COMPARISON:  Chest radiograph dated 04/26/2024 and abdominal CT dated 04/11/2024. FINDINGS: CTA CHEST FINDINGS Cardiovascular: There is no cardiomegaly or pericardial effusion. The thoracic aorta is unremarkable. The origins of the great vessels of the aortic arch are patent. No pulmonary artery embolus identified. Mediastinum/Nodes: No hilar or mediastinal adenopathy. The esophagus is grossly unremarkable. No mediastinal fluid collection. Lungs/Pleura: There is a 15 mm nodule in the superior segment of the right lower lobe. No focal consolidation, pleural effusion or pneumothorax. The central airways are patent. Musculoskeletal: No acute osseous pathology. Old appearing fracture of the anterior left 4th-7th ribs. Review of the MIP images confirms the above findings. CT ABDOMEN and PELVIS FINDINGS No intra-abdominal free air.  Small ascites. Hepatobiliary: Cirrhosis. No biliary dilatation. The gallbladder is unremarkable. Pancreas: Unremarkable. No pancreatic ductal dilatation or surrounding inflammatory changes. Spleen: Normal in size without focal abnormality. Adrenals/Urinary Tract: The adrenal glands, kidneys, visualized ureters, and urinary bladder unremarkable. Stomach/Bowel: There is no bowel obstruction or active inflammation. The appendix is normal. Vascular/Lymphatic: Mild aortoiliac atherosclerotic disease. The IVC is unremarkable. No portal gas. Para soft Jewel of versus and recanalization of the umbilical vein in keeping with portal hypertension. There is no adenopathy. Reproductive: The prostate and seminal vesicles are grossly unremarkable. Other: None Musculoskeletal: No acute or significant osseous findings. Review of the MIP images confirms the above findings. IMPRESSION: 1. No acute intrathoracic, abdominal, or pelvic  pathology. No CT evidence of  pulmonary artery embolus. 2. A 15 mm nodule in the superior segment of the right lower lobe. Consider one of the following in 3 months for both low-risk and high-risk individuals: (a) repeat chest CT, (b) follow-up PET-CT, or (c) tissue sampling. This recommendation follows the consensus statement: Guidelines for Management of Incidental Pulmonary Nodules Detected on CT Images: From the Fleischner Society 2017; Radiology 2017; 284:228-243. 3. Cirrhosis with small ascites and evidence of portal hypertension. 4. No bowel obstruction. Normal appendix. 5.  Aortic Atherosclerosis (ICD10-I70.0). Electronically Signed   By: Vanetta Chou M.D.   On: 04/26/2024 18:10   DG Chest Portable 1 View Result Date: 04/26/2024 CLINICAL DATA:  Bloody emesis, diarrhea and shortness of breath. EXAM: PORTABLE CHEST 1 VIEW COMPARISON:  None Available. FINDINGS: The heart size and mediastinal contours are within normal limits. A stable, likely benign, 2 mm nodular opacity is seen along the periphery of the right upper lobe. An ill-defined 14 mm nodular opacity is also seen overlying the mid right lung. This is not clearly identified on the prior study. No acute infiltrate, pleural effusion or pneumothorax is identified. The visualized skeletal structures are unremarkable. IMPRESSION: 1. No acute cardiopulmonary disease. 2. Ill-defined nodular opacity overlying the mid right lung. Correlation with nonemergent chest CT is recommended to exclude the presence of an underlying pulmonary nodule. Electronically Signed   By: Suzen Dials M.D.   On: 04/26/2024 16:08   MR LIVER W WO CONTRAST Result Date: 04/13/2024 CLINICAL DATA:  Liver lesions on CT. EXAM: MRI ABDOMEN WITHOUT AND WITH CONTRAST TECHNIQUE: Multiplanar multisequence MR imaging of the abdomen was performed both before and after the administration of intravenous contrast. CONTRAST:  9mL GADAVIST  GADOBUTROL  1 MMOL/ML IV SOLN COMPARISON:  CT  scan 1 day prior. FINDINGS: Lower chest: 17 mm subpleural nodule identified posterior right lung on coronal image 27 of series 3 and axial image 5 of series 12. Hepatobiliary: Liver measures 23 cm craniocaudal length with diffuse nodular signal intensity. Nodularity of the contour associated. Features compatible cirrhosis. No focal restricted diffusion within the liver parenchyma. No focal arterial phase hyperenhancement. Areas of subcapsular heterogeneous enhancement are identified in the lateral segment left liver and inferior right liver as well as along the gallbladder fossa. 2.9 x 2.0 cm subcapsular observation in the medial segment left liver along the gallbladder fossa (image 81/14) shows some subtle posterior irregular arterial phase hyperenhancement although this is only minimal. Lesion hypo enhances relative to background liver parenchyma and the posterior hyperenhancement does not appear to washout. A second similar observation in the medial segment left liver along the gallbladder fossa measures 2.9 x 2.1 cm on image 81/14. No definite non peripheral arterial phase hyperenhancement although the lesion does hypo enhance relative to background liver parenchyma. Gallbladder is distended with gallbladder wall irregularity and gallbladder wall edema/pericholecystic fluid. No definite gallstones. No intrahepatic or extrahepatic biliary dilation. Pancreas: No focal mass lesion. No dilatation of the main duct. No intraparenchymal cyst. No peripancreatic edema. Spleen:  No splenomegaly. No suspicious focal mass lesion. Adrenals/Urinary Tract: No adrenal nodule or mass. Kidneys unremarkable. Stomach/Bowel: Stomach is unremarkable. No gastric wall thickening. No evidence of outlet obstruction. Duodenum is normally positioned as is the ligament of Treitz. No small bowel or colonic dilatation within the visualized abdomen. Vascular/Lymphatic: No abdominal aortic aneurysm. Portal vein and superior mesenteric vein are  patent. Splenic vein is patent. No lymphadenopathy in the abdomen. Other: Diffuse mesenteric edema is associated with small volume ascites. Musculoskeletal: No focal suspicious marrow  enhancement within the visualized bony anatomy. IMPRESSION: 1. Markedly heterogeneous and nodular liver compatible cirrhosis. Observations in the medial segment left liver compatible with LI-RADS category 3, intermediate probability of malignancy. Follow-up MRI in 3 months recommended. 2. Cirrhosis with small volume ascites. 3. Distended gallbladder with gallbladder wall irregularity and gallbladder wall edema/pericholecystic fluid. No definite gallstones. Appearance is nonspecific in the setting of liver insufficiency. 4. 17 mm subpleural nodule posterior right lung. CT chest without contrast recommended to further evaluate. Electronically Signed   By: Camellia Candle M.D.   On: 04/13/2024 08:17   CT ABDOMEN PELVIS W CONTRAST Result Date: 04/11/2024 CLINICAL DATA:  Cirrhosis, jaundice, abdominal pain EXAM: CT ABDOMEN AND PELVIS WITH CONTRAST TECHNIQUE: Multidetector CT imaging of the abdomen and pelvis was performed using the standard protocol following bolus administration of intravenous contrast. RADIATION DOSE REDUCTION: This exam was performed according to the departmental dose-optimization program which includes automated exposure control, adjustment of the mA and/or kV according to patient size and/or use of iterative reconstruction technique. CONTRAST:  OMNIPAQUE  IOHEXOL  300 MG/ML  SOLN COMPARISON:  04/16/2021 FINDINGS: Lower chest: No acute pleural or parenchymal lung disease. Hepatobiliary: There is marked heterogeneity of the liver parenchyma, compatible with cirrhosis. Numerous hypodense nodules are seen throughout the liver, indeterminate on this single phase exam. Correlation with nonemergent outpatient dedicated liver MRI with without contrast is recommended for further characterization and to assess for  underlying malignancy. No biliary duct dilation. Marked distension of the gallbladder without evidence of cholelithiasis or cholecystitis. Pancreas: Unremarkable. No pancreatic ductal dilatation or surrounding inflammatory changes. Spleen: Normal in size without focal abnormality. Adrenals/Urinary Tract: Adrenal glands are unremarkable. Kidneys are normal, without renal calculi, focal lesion, or hydronephrosis. Bladder is unremarkable. Stomach/Bowel: No bowel obstruction or ileus. There is nonspecific diffuse colonic wall thickening, most pronounced within the cecum and ascending colon. Findings could reflect inflammatory or infectious colitis, though portal colopathy could give a similar appearance given the presence of cirrhosis. Normal appendix right lower quadrant. Vascular/Lymphatic: Atherosclerosis of the abdominal aorta. There is evidence of portal venous hypertension, with enlarged main portal vein, recanalization of the umbilical vein, as well as numerous upper abdominal varices primarily within the stomach and distal esophagus. No pathologic adenopathy. Reproductive: Prostate is unremarkable. Other: Trace ascites. No free intraperitoneal gas. No abdominal wall hernia. Musculoskeletal: No acute or destructive bony abnormalities. Reconstructed images demonstrate no additional findings. IMPRESSION: 1. Cirrhotic morphology of the liver, with numerous hypodense nodules throughout the liver parenchyma. When the patient is clinically stable and able to follow directions and hold their breath (preferably as an outpatient) further evaluation with dedicated abdominal MRI should be considered for further characterization and to exclude underlying hepatocellular carcinoma. 2. Portal venous hypertension, manifested by trace ascites, recanalization of the umbilical vein, and extensive upper abdominal varices. 3. Diffuse colonic wall thickening, nonspecific. Differential diagnosis would include inflammatory/infectious  colitis versus portal colopathy. 4. Distended gallbladder, with no evidence of cholelithiasis or cholecystitis. 5.  Aortic Atherosclerosis (ICD10-I70.0). Electronically Signed   By: Ozell Daring M.D.   On: 04/11/2024 19:18    Micro Results    Recent Results (from the past 240 hours)  Blood culture (routine x 2)     Status: None (Preliminary result)   Collection Time: 04/26/24  4:06 PM   Specimen: BLOOD  Result Value Ref Range Status   Specimen Description BLOOD LEFT ANTECUBITAL  Final   Special Requests   Final    BOTTLES DRAWN AEROBIC AND ANAEROBIC Blood Culture adequate  volume   Culture   Final    NO GROWTH 2 DAYS Performed at [facility], [address] [place], [place] [address]    Report Status PENDING  Incomplete  Blood culture (routine x 2)     Status: None (Preliminary result)   Collection Time: 04/26/24  4:24 PM   Specimen: BLOOD  Result Value Ref Range Status   Specimen Description BLOOD LEFT ANTECUBITAL  Final   Special Requests   Final    BOTTLES DRAWN AEROBIC AND ANAEROBIC Blood Culture results may not be optimal due to an inadequate volume of blood received in culture bottles   Culture   Final    NO GROWTH 2 DAYS Performed at [facility], [address] [place], [place] [address]    Report Status PENDING  Incomplete    Today   Subjective    Craig Andrews today has no headache,no chest abdominal pain,no new weakness tingling or numbness, feels much better wants to go home today.    Objective   Blood pressure 124/87, pulse 73, temperature 98.6 F (37 C), temperature source Oral, resp. rate 18, height 5' 11 (1.803 m), weight 88.2 kg, SpO2 96%.   Intake/Output Summary (Last 24 hours) at 04/30/2024 0835 Last data filed at 04/29/2024 1330 Gross per 24 hour  Intake 778 ml  Output 400 ml  Net 378 ml    Exam  Awake Alert, No new F.N deficits,    [place].AT,PERRAL Supple Neck,   Symmetrical Chest wall movement, Good air movement bilaterally,  CTAB RRR,No Gallops,   +ve B.Sounds, Abd Soft, Non tender,  No Cyanosis, Clubbing or edema    Data Review   Recent Labs  Lab 04/27/24 0042 04/27/24 0405 04/27/24 1308 04/27/24 2025 04/28/24 1025 04/29/24 0445 04/30/24 0631  WBC 17.6* 16.7* 13.6* 12.4*  --  7.5 6.8  HGB 7.9* 8.2* 7.2* 7.4* 7.5* 7.4* 8.3*  HCT 22.8* 23.5* 20.6* 21.7* 22.1* 21.6* 24.0*  PLT 190 193 148* 157  --  138* 127*  MCV 101.3* 99.2 101.0* 102.4*  --  104.3* 103.0*  MCH 35.1* 34.6* 35.3* 34.9*  --  35.7* 35.6*  MCHC 34.6 34.9 35.0 34.1  --  34.3 34.6  RDW 17.7* 17.9* 18.0* 18.0*  --  18.6* 19.9*  LYMPHSABS 2.7  --   --   --   --   --  1.2  MONOABS 2.3*  --   --   --   --   --  0.7  EOSABS 0.4  --   --   --   --   --  0.4  BASOSABS 0.1  --   --   --   --   --  0.1    Recent Labs  Lab 04/26/24 1442 04/26/24 1606 04/26/24 1820 04/27/24 0042 04/27/24 1308 04/28/24 0419 04/29/24 0445 04/30/24 0631  NA 127*  --   --   --  129* 135 136 136  K 4.2  --   --   --  3.8 3.4* 3.9 3.2*  CL 95*  --   --   --  101 105 106 106  CO2 15*  --   --   --  21* 24 26 22   ANIONGAP 17*  --   --   --  8 7 5 8   GLUCOSE 364*  --   --   --  227* 99 128* 101*  BUN 8  --   --   --  6 <5* <5* <5*  CREATININE 0.58*  --   --   --  0.65 0.58* 0.72 0.58*  AST 137*  --   --   --   --   --  138* 132*  ALT 53*  --   --   --   --   --  53* 49*  ALKPHOS 99  --   --   --   --   --  100 127*  BILITOT 5.6*  --   --   --   --   --  4.4* 4.2*  ALBUMIN  2.7*  --   --   --   --   --  2.4* 2.3*  LATICACIDVEN  --  5.0* 4.6* 2.8*  --   --   --   --   INR  --  1.2  --   --   --   --  1.3*  --   MG  --   --   --   --   --   --   --  1.9  CALCIUM 7.4*  --   --   --  6.8* 7.0* 7.4* 7.2*    Total Time in preparing paper work, data evaluation and todays exam - 35 minutes  Signature  -    Lavada Stank M.D on 04/30/2024 at 8:35 AM   -  To page go to [email or web address]      "

## 2024-04-30 NOTE — Discharge Instructions (Addendum)
 Follow with Primary MD Harl Jayson CROME, MD (Inactive) in 2 to 3 days, you must see your gastroenterologist in [place] within a week and get another EGD done within 2 weeks.    You also need a referral from your PCP to a pulmonologist within 2 to 3 weeks for a repeat CT scan of the chest in 3 months.  To monitor a lung nodule.  Get CBC, CMP, Magnesium  -  checked next visit with your primary MD    Activity: As tolerated with Full fall precautions use walker/cane & assistance as needed  Disposition Home    Diet: Heart Healthy    Special Instructions: If you have smoked or chewed Tobacco  in the last 2 yrs please stop smoking, stop any regular Alcohol  and or any Recreational drug use.  On your next visit with your primary care physician please Get Medicines reviewed and adjusted.  Please request your Prim.MD to go over all Hospital Tests and Procedure/Radiological results at the follow up, please get all Hospital records sent to your Prim MD by signing hospital release before you go home.  If you experience worsening of your admission symptoms, develop shortness of breath, life threatening emergency, suicidal or homicidal thoughts you must seek medical attention immediately by calling 911 or calling your MD immediately  if symptoms less severe.  You Must read complete instructions/literature along with all the possible adverse reactions/side effects for all the Medicines you take and that have been prescribed to you. Take any new Medicines after you have completely understood and accpet all the possible adverse reactions/side effects.   Do not drive when taking Pain medications.  Do not take more than prescribed Pain, Sleep and Anxiety Medications  Wear Seat belts while driving.

## 2024-05-01 ENCOUNTER — Ambulatory Visit (HOSPITAL_COMMUNITY): Admission: RE | Admit: 2024-05-01 | Payer: Self-pay | Source: Ambulatory Visit

## 2024-05-01 LAB — CULTURE, BLOOD (ROUTINE X 2)
Culture: NO GROWTH
Culture: NO GROWTH
Special Requests: ADEQUATE

## 2024-05-31 ENCOUNTER — Ambulatory Visit (INDEPENDENT_AMBULATORY_CARE_PROVIDER_SITE_OTHER): Payer: Self-pay | Admitting: Gastroenterology
